# Patient Record
Sex: Female | Born: 1955
Health system: Southern US, Community
[De-identification: ages and names within clinical notes are randomized; demographics above are authoritative.]

## PROBLEM LIST (undated history)

## (undated) DIAGNOSIS — E7800 Pure hypercholesterolemia, unspecified: Secondary | ICD-10-CM

## (undated) DIAGNOSIS — J449 Chronic obstructive pulmonary disease, unspecified: Secondary | ICD-10-CM

## (undated) DIAGNOSIS — R609 Edema, unspecified: Secondary | ICD-10-CM

## (undated) DIAGNOSIS — E782 Mixed hyperlipidemia: Secondary | ICD-10-CM

## (undated) DIAGNOSIS — I6523 Occlusion and stenosis of bilateral carotid arteries: Secondary | ICD-10-CM

## (undated) DIAGNOSIS — J45909 Unspecified asthma, uncomplicated: Secondary | ICD-10-CM

## (undated) DIAGNOSIS — Z860100 Personal history of colon polyps, unspecified: Secondary | ICD-10-CM

## (undated) DIAGNOSIS — G8929 Other chronic pain: Secondary | ICD-10-CM

## (undated) DIAGNOSIS — F1020 Alcohol dependence, uncomplicated: Secondary | ICD-10-CM

## (undated) DIAGNOSIS — R4689 Other symptoms and signs involving appearance and behavior: Secondary | ICD-10-CM

## (undated) DIAGNOSIS — D126 Benign neoplasm of colon, unspecified: Secondary | ICD-10-CM

## (undated) DIAGNOSIS — K259 Gastric ulcer, unspecified as acute or chronic, without hemorrhage or perforation: Secondary | ICD-10-CM

## (undated) DIAGNOSIS — K625 Hemorrhage of anus and rectum: Secondary | ICD-10-CM

## (undated) DIAGNOSIS — F411 Generalized anxiety disorder: Secondary | ICD-10-CM

## (undated) DIAGNOSIS — M797 Fibromyalgia: Secondary | ICD-10-CM

## (undated) DIAGNOSIS — F329 Major depressive disorder, single episode, unspecified: Secondary | ICD-10-CM

## (undated) DIAGNOSIS — K2950 Unspecified chronic gastritis without bleeding: Secondary | ICD-10-CM

## (undated) DIAGNOSIS — L409 Psoriasis, unspecified: Secondary | ICD-10-CM

## (undated) DIAGNOSIS — K589 Irritable bowel syndrome without diarrhea: Secondary | ICD-10-CM

## (undated) DIAGNOSIS — F332 Major depressive disorder, recurrent severe without psychotic features: Secondary | ICD-10-CM

## (undated) DIAGNOSIS — K219 Gastro-esophageal reflux disease without esophagitis: Secondary | ICD-10-CM

## (undated) DIAGNOSIS — F17200 Nicotine dependence, unspecified, uncomplicated: Secondary | ICD-10-CM

## (undated) DIAGNOSIS — C50211 Malignant neoplasm of upper-inner quadrant of right female breast: Secondary | ICD-10-CM

## (undated) DIAGNOSIS — M5430 Sciatica, unspecified side: Secondary | ICD-10-CM

## (undated) DIAGNOSIS — F32A Depression, unspecified: Secondary | ICD-10-CM

## (undated) DIAGNOSIS — F603 Borderline personality disorder: Secondary | ICD-10-CM

## (undated) DIAGNOSIS — E785 Hyperlipidemia, unspecified: Secondary | ICD-10-CM

## (undated) DIAGNOSIS — C50919 Malignant neoplasm of unspecified site of unspecified female breast: Secondary | ICD-10-CM

## (undated) DIAGNOSIS — I10 Essential (primary) hypertension: Secondary | ICD-10-CM

## (undated) DIAGNOSIS — F419 Anxiety disorder, unspecified: Secondary | ICD-10-CM

## (undated) DIAGNOSIS — J309 Allergic rhinitis, unspecified: Secondary | ICD-10-CM

## (undated) HISTORY — PX: DIAGNOSTIC LAPAROSCOPY: SUR761

## (undated) HISTORY — DX: Mixed hyperlipidemia: E78.2

## (undated) HISTORY — DX: Chronic obstructive pulmonary disease, unspecified: J44.9

## (undated) HISTORY — DX: Occlusion and stenosis of bilateral carotid arteries: I65.23

## (undated) HISTORY — DX: Unspecified chronic gastritis without bleeding: K29.50

## (undated) HISTORY — DX: Gastro-esophageal reflux disease without esophagitis: K21.9

## (undated) HISTORY — DX: Anxiety disorder, unspecified: F41.9

## (undated) HISTORY — DX: Hemorrhage of anus and rectum: K62.5

## (undated) HISTORY — DX: Fibromyalgia: M79.7

## (undated) HISTORY — DX: Personal history of colonic polyps: Z86.010

## (undated) HISTORY — DX: Benign neoplasm of colon, unspecified: D12.6

## (undated) HISTORY — DX: Psoriasis, unspecified: L40.9

## (undated) HISTORY — DX: Unspecified asthma, uncomplicated: J45.909

## (undated) HISTORY — DX: Malignant neoplasm of unspecified site of unspecified female breast: C50.919

## (undated) HISTORY — PX: DILATION AND CURETTAGE OF UTERUS: SHX78

## (undated) HISTORY — DX: Irritable bowel syndrome, unspecified: K58.9

## (undated) HISTORY — DX: Generalized anxiety disorder: F41.1

## (undated) HISTORY — DX: Malignant neoplasm of upper-inner quadrant of right female breast: C50.211

## (undated) HISTORY — DX: Other symptoms and signs involving appearance and behavior: R46.89

## (undated) HISTORY — DX: Other chronic pain: G89.29

## (undated) HISTORY — DX: Major depressive disorder, recurrent severe without psychotic features: F33.2

## (undated) HISTORY — DX: Pure hypercholesterolemia, unspecified: E78.00

## (undated) HISTORY — DX: Sciatica, unspecified side: M54.30

## (undated) HISTORY — DX: Alcohol dependence, uncomplicated: F10.20

## (undated) HISTORY — DX: Nicotine dependence, unspecified, uncomplicated: F17.200

## (undated) HISTORY — DX: Allergic rhinitis, unspecified: J30.9

## (undated) HISTORY — DX: Hyperlipidemia, unspecified: E78.5

## (undated) HISTORY — PX: GYNECOLOGIC CRYOSURGERY: SHX857

## (undated) HISTORY — DX: Personal history of colon polyps, unspecified: Z86.0100

## (undated) HISTORY — DX: Borderline personality disorder: F60.3

---

## 2011-10-24 DIAGNOSIS — G8929 Other chronic pain: Secondary | ICD-10-CM | POA: Diagnosis present

## 2011-10-24 HISTORY — DX: Other chronic pain: G89.29

## 2011-11-15 DIAGNOSIS — M797 Fibromyalgia: Secondary | ICD-10-CM | POA: Diagnosis present

## 2011-11-15 DIAGNOSIS — J449 Chronic obstructive pulmonary disease, unspecified: Secondary | ICD-10-CM

## 2011-11-15 HISTORY — DX: Chronic obstructive pulmonary disease, unspecified: J44.9

## 2012-02-11 DIAGNOSIS — L409 Psoriasis, unspecified: Secondary | ICD-10-CM | POA: Insufficient documentation

## 2012-02-11 HISTORY — DX: Psoriasis, unspecified: L40.9

## 2014-02-01 ENCOUNTER — Emergency Department: Payer: Self-pay | Admitting: Student

## 2014-06-30 ENCOUNTER — Inpatient Hospital Stay: Payer: Self-pay | Admitting: Internal Medicine

## 2014-06-30 ENCOUNTER — Other Ambulatory Visit: Payer: Self-pay | Admitting: Emergency Medicine

## 2014-07-04 ENCOUNTER — Inpatient Hospital Stay: Payer: Self-pay | Admitting: Psychiatry

## 2014-09-18 NOTE — Consult Note (Signed)
PATIENT NAME:  Elizabeth Cherry, Elizabeth Cherry MR#:  [id number] DATE OF BIRTH:  [birth year]  DATE OF CONSULTATION:  07/01/2014  REFERRING PHYSICIAN:   CONSULTING PHYSICIAN:  Gonzella Lex, MD  I am certain I did this yesterday, and I do not know what happened to it.   IDENTIFYING INFORMATION AND REASON FOR CONSULTATION: This is a 59 year old woman who came into the hospital unresponsive.   CHIEF COMPLAINT: "I took an overdose."   HISTORY OF PRESENT ILLNESS: Information obtained from the patient and the chart. The patient reports that she took an overdose of approximately 600 mg of Restoril. She states that this was an impulsive ACT at the time, but she had been having suicidal thoughts for over a week. She has been feeling extremely depressed for months. Poor sleep, poor appetite. Feels hopeless. Denies having any hallucinations. Says that she has frequent suicidal thoughts and feels like her life is hopeless. These symptoms have been present for years, but have been worse recently. She is not currently seeing anyone for any outpatient psychiatric treatment. She denies any recent abuse of alcohol or drugs. The patient evidently had been to outside observers in her usual state of health until she was told that she was not able to live in the house where she was staying. She went and locked herself in her room and the next morning, was found to be unconscious.   PAST PSYCHIATRIC HISTORY: The patient has a history of depression with several prior hospitalizations. Has been on multiple medications in the past. Zoloft had been effective years ago, but then no longer seemed to be helping. More recently, other antidepressants and other medications had not seemed to help. Has a history of at least 1 serious suicide attempt in the past, as well.   FAMILY HISTORY: Several people in the family with depression and some family history of suicide.   PAST MEDICAL HISTORY: The patient states she has chronic pain from fibromyalgia.  Used to be on fentanyl patches, which are no longer being prescribed for her.   SOCIAL HISTORY: The patient is not married. She used to work in IT until a few years ago. Has not worked in quite a while. Was recently evicted from the home she was renting because she intentionally stopped paying the rent, despite knowing the outcome. Now, has no place to live.   SUBSTANCE ABUSE HISTORY: Past history of alcohol abuse. She says she stopped drinking years ago and no longer drinks. Has used marijuana occasionally.   The patient has some possible history of abuse of Xanax and opiates, but she denies or minimizes it.   CURRENT MEDICATIONS: None by her report.   ALLERGIES: BACTRIM.   REVIEW OF SYSTEMS: Depressed mood. Suicidal ideation. No hallucinations. Feels tired and run down. Feels like she has full body aches and pains.   MENTAL STATUS EXAMINATION: Slightly disheveled woman, looks her stated age. Cooperative with the interview. Eye contact good. Psychomotor activity restrained. Speech is flat in tone, normal in rate. Affect is dysphoric, down, and irritable. Mood is stated as depressed. Thoughts: Lucid, no loosening of associations. Denies auditory or visual hallucinations. Denies suicidal intent currently, but says she still has suicidal ideation and wishes she were dead. No homicidal ideation. She is alert and oriented x 4. Repeats 3 objects immediately, remembers 2 of them at 3 minutes. Judgment and insight: Poor. Intelligence: Normal.   LABORATORY RESULTS: Blood sugars low on admission, but had come up to normal. TSH normal. Low protein and albumin,  possibly poor nutrition. Alcohol level negative. Benzodiazepines positive on drug screen.   VITAL SIGNS: Blood pressure 102/72, respirations 18, pulse 78, temperature 98.1.   ASSESSMENT: This is a 59 year old woman with a history of severe major depression, suicidal ideation, and a recent clearcut suicide attempt with ongoing suicidal thoughts. The  patient is irritable and oppositional to treatment, while symptoms the same time, maintaining her suicidality. The patient requires hospitalization for further treatment and safety.   TREATMENT PLAN: I initiated involuntary commitment papers on her to keep her in the hospital. I will admit her to psychiatry as soon as a bed is available. Currently, we do not have beds available. I will communicate with the internal medicine team that we will need to keep for observation until we can transfer her to psychiatry.   DIAGNOSIS, PRINCIPAL AND PRIMARY:  AXIS I: Major depression, severe, recurrent.   SECONDARY DIAGNOSES: AXIS I: Rule out benzodiazepine abuse. History of alcohol abuse, in remission.  AXIS II: Some histrionic features.  AXIS III: Chronic pain. Recent overdose.    ____________________________ Gonzella Lex, MD jtc:mw D: 07/02/2014 16:58:40 ET T: 07/02/2014 17:11:38 ET JOB#: [id number]  cc: Gonzella Lex, MD, <Dictator> Gonzella Lex MD ELECTRONICALLY SIGNED 07/04/2014 10:44

## 2014-09-18 NOTE — H&P (Signed)
PATIENT NAME:  Elizabeth Cherry, Elizabeth Cherry MR#:  [id number] DATE OF BIRTH:  Jan 26, 1956  DATE OF ADMISSION:  07/04/2014  DATE OF BIRTH: [birth year]    DATE OF ASSESSMENT: February 16  REFERRING PHYSICIAN:  [facility].   ATTENDING PHYSICIAN:  Clovis Fredrickson, MD   IDENTIFYING DATA: Elizabeth Cherry is a 59 year old female with history of depression and anxiety.   CHIEF COMPLAINT: "I overdosed."   HISTORY OF PRESENT ILLNESS: Elizabeth Cherry has been under considerable stress lately. She stopped taking Celexa several months ago and has become increasingly depressed with poor sleep, decreased appetite, anhedonia, feeling of guilt, hopelessness, worthlessness, poor energy and concentration, social isolation, crying spells, and heightened anxiety. She lost her housing. She has been renting a house for 14 years, but it no longer is available to her. She briefly moved in with a friend, but this is not a permanent solution and while she is waiting to finalize apartment rental in [place], she was asked by her friend to move out. That night she went to her room overdosed on a bottle of Restoril. She was found the following morning unresponsive and hypothermic. She was brought to the hospital and briefly admitted to CCU. She was transferred to [place] unit for further stabilization. The patient initially was somewhat confused from overdose on benzodiazepines and not able to participate in psychiatric interview. She would give [place] non reassuring answers, uncertain if she was glad to be around, uncertain whether she overdosed on purpose or not and completely clueless about what to do next when she is discharged from the hospital. I did see this patient on the medical floor on Sunday; unfortunately, we did not have beds at that time and only on Monday, the patient was transferred to psychiatry. She is somewhat better, her thoughts are better put together. She now is able to answer some  questions; although, she is still rather vague. She does admit that it was a suicide attempt; that she has been overwhelmed lately and she did it on an impulse. She is glad to be alive. She is uncertain whether or not she feels that she can face multiple problems. For example, she cannot really stay with a friend any longer and on Monday, will be moving to new apartment in [place], if it is finalized. She still needs talk to talk to the landlord. She denies psychotic symptoms, denies symptoms suggestive of bipolar mania. She denies alcohol, illicit drugs or prescription pill abuse.   PAST PSYCHIATRIC HISTORY: A long history of depression and anxiety. She has been tried on Prozac, Zoloft, Lexapro, Celexa, Wellbutrin, Cymbalta, and Remeron in the past. She believes that Prozac was the best medication she has ever had.   SHE BECAME ALLERGIC TO THE COMPONENTS OF GENETIC PREPARATION OF PROZAC WITH SWELLING OF HER THROAT AND TONGUE.   Somehow she was unable to obtained brand name of Prozac ever since. Zoloft was the next one working somewhat better, she took it for 7 years, but then it stopped working and she is not really interested in going back. Prior to the hospital, she was prescribed Celexa, took it for a while but then stopped. She did not feel that it was particularly useful. She is not interested in augmentation. She has never taken Abilify, thyroid hormones, or BuSpar, but she wants to limit the number of medications she is using and is not interested in adding yet another medicine to her regimen.   She reports at least 1 suicide attempt  2 years ago when she had to put her cat to sleep; she overdosed on blood pressure medication and was admitted to [facility]. I believe that there were other psychiatric admissions, although the patient is not forthcoming with the information as she makes a comment that our unit is the most difficult for her to be in as we have mixed population of patient and some of her peers  appear frightening to her. She feels unsafe here. There is a history of alcohol abuse, but she has been sober for many years.   FAMILY PSYCHIATRIC HISTORY: None reported.   PAST MEDICAL HISTORY: Hypertension, fibromyalgia, GERD.   ALLERGIES:  BACTRIM.   MEDICATIONS: At the time of transfer, sucralfate 1 mg 3 times daily, before meals; pantoprazole 40 mg twice daily, Bentyl 10 mg every 8 hours.   SOCIAL HISTORY: She is disabled from fibromyalgia. 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 She is not married. She has very limited support apparently but does really not want to talk about it. She used to rent a house in [place], for many years. She is now forced to relocate. Apparently, she found an apartment in [place]. She does not have a psychiatrist. She tells me that last time she went to [facility] and she was forced to take drug test which was negative and she did not feel she needed it. Her insurance company did not cover it and she had to pay 175 dollars, in addition to 60 dollars of her copay. She understands that this has been their policy and she will not see them again as she simply cannot afford it and does not feel that drug testing is necessary in her case.   REVIEW OF SYSTEMS:  CONSTITUTIONAL: No fevers or chills. No weight changes.  EYES: No double or blurred vision.  ENT: No hearing loss.  RESPIRATORY:  No shortness of breath or cough.  CARDIOVASCULAR: No chest pain or orthopnea.  GASTROINTESTINAL: Positive for occasional abdominal pain and diarrhea.  GENITOURINARY: No incontinence or frequency.  ENDOCRINE: No heat or cold intolerance.  LYMPHATIC: No anemia or easy bruising.  INTEGUMENTARY: No acne or rash.  MUSCULOSKELETAL: No muscle or joint pain.  NEUROLOGIC: No tingling or weakness.  PSYCHIATRIC: See history of present illness for details.   PHYSICAL EXAMINATION: VITAL SIGNS: Blood pressure 116/79, pulse 91, respirations 18,  temperature 98.  GENERAL: This is a slender middle-aged female in no acute distress.  HEENT: The pupils are equal, round, and reactive to light. Sclerae are anicteric.  NECK: Supple. No thyromegaly.  LUNGS: Clear to auscultation. No dullness to percussion.  HEART: Regular rhythm and rate. No members, rubs, or gallops.  ABDOMEN: Soft, nontender, nondistended. Positive bowel sounds.  MUSCULOSKELETAL: Normal muscle strength in all extremities.  SKIN: No rashes or bruises.  LYMPHATIC: No cervical adenopathy.  NEUROLOGIC: Cranial nerves II through XII are intact.   LABORATORY DATA: Chemistries are within normal limits; blood alcohol level on admission zero; LFTs within normal limits, TSH 1.35, urine tox screen positive for benzodiazepines; CBC within normal limits. Urinalysis is not suggestive of urinary tract infection. Serum acetaminophen and salicylates were low.   MENTAL STATUS EXAMINATION: On admission, the patient is alert and oriented to person, place, time and situation. She is pleasant, polite and cooperative but difficult to engage, rather guarded. She is well groomed and casually dressed. She maintains good eye contact. Her speech is of normal rhythm, rate and volume. There is paucity of  speech. Mood is depressed and anxious. She denies thoughts of hurting herself or others. There are no delusions or paranoia. There are no auditory or visual hallucinations. Her cognition is grossly intact. Registration, recall, short and long-term memory are intact. She is of average intelligence and fund of knowledge. Her insight and judgment are fair.   SUICIDE RISK ASSESSMENT: On admission, this is a patient with a long history of depression, anxiety, and suicide attempts, who was admitted after a suicide attempt in the context of multiple social stressors.   AXIS I: Major depressive disorder, recurrent, severe.   AXIS II: Deferred.   AXIS III: Fibromyalgia, hypertension.   PLAN: The patient was  admitted to [place] unit for safety, stabilization and medication management.  1.  Suicidal ideation: The patient is able to contract for safety.  2.  Mood. I started her last night on Celexa as this was last antidepressant she has taken. She does not want to go back to Zoloft. She does not want to go back to [place].  She is not interested in augmentation.  3.  Medical. We will continue Bentyl and Carafate and pantoprazole for stomach problems.  4.  Chronic pain.  The patient used to go to pain clinic and was prescribed fentanyl several years ago but no longer is a patient there.  5.  Hypertension. She has a history of high blood pressure, ordinarily on metoprolol 50 mg. This was discontinued on the medical floor due to low blood pressure. We will restart metoprolol.   DISPOSITION: The patient will meet with her friend today to discuss her options. She will work with the Education officer, museum on finding proper follow-up arrangements within her network.    ____________________________ Wardell Honour. Bary Leriche, MD jbp:nt D: 07/05/2014 13:40:49 ET T: 07/05/2014 14:04:30 ET JOB#: [id number]  cc: Charlis Harner B. Bary Leriche, MD, <Dictator> Clovis Fredrickson MD ELECTRONICALLY SIGNED 08/01/2014 7:18

## 2014-09-18 NOTE — H&P (Signed)
PATIENT NAME:  Elizabeth Cherry, BERNALES MR#:  [id number] DATE OF BIRTH:  22-Apr-1956  DATE OF ADMISSION:  06/30/2014  DATE OF DISCHARGE: 06/30/2014.   PRIMARY CARE PROVIDER: None.   EMERGENCY DEPARTMENT REFERRING PHYSICIAN: Dr. Archie Balboa.   CHIEF COMPLAINT: Decrease in responsiveness.   HISTORY OF PRESENT ILLNESS: The patient is a 59 year old white female with history of alcohol abuse in the past who was kicked out from the place where she lives, so she went to stay with a friend since last Wednesday. He reports that she was doing well up until last night, when he told her that she would have to leave his house. The patient locked herself in the room. Then, when he tried to wake her up this morning she was very lethargic. The patient was brought to the ED initially hypothermic, temperature 95.6, was very hard to arouse. Her blood pressure was low in the 80s, but her WBC count was normal. A CT scan of the head was negative. The only thing that was positive in her toxic urine drug screen was benzodiazepines and she does have a history of using possible Xanax. Other than that, her friend is unable to provide any history. There is no family available.   PAST MEDICAL HISTORY: History of alcohol abuse. According to him, he met her in Alcoholics Anonymous and to his knowledge, she has not drunk in 18 years. No other medical history is known. He does report that she has some sort of skin dry disorder going on for the past three months.   PAST SURGICAL HISTORY: Unknown.   ALLERGIES: BACTRIM.   MEDICATIONS: Unknown.  SOCIAL HISTORY: She states that she smokes one-half pack per day. Does not drink.  He is not aware of any drug use. No family history is available.   REVIEW OF SYSTEMS: Unobtainable.   PHYSICAL EXAMINATION: VITAL SIGNS: Temperature 95.6, pulse 83, respirations 18, blood pressure 83/64, O2 96%.  GENERAL: The patient is a critically ill-appearing female. Poorly responsive.  HEENT: Head atraumatic,  normocephalic. Pupils equally round, reactive to light and accommodation. There is no conjunctival pallor no scleral icterus. Nasal exam shows no drainage or ulceration. Oropharynx from exterior appears dry. Ear exam externally: No erythema or ulcers. Nasal exam shows no drainage or ulceration.  CARDIOVASCULAR: Regular rate and rhythm. No murmurs, rubs, clicks, or gallops.  LUNGS: Clear to auscultation bilaterally without any rales, rhonchi, wheezing.  ABDOMEN: Soft, nontender, nondistended. Positive bowel sounds x4.  EXTREMITIES: No clubbing, cyanosis, or edema.  SKIN: She has basically dry scaly skin on her face, throughout her face, which apparently is a chronic issue going on for the past few months.  NEUROLOGIC: The patient is poorly responsive. Unable to do an exam.  PSYCHIATRIC: The patient is poorly responsive.  LYMPH NODES: Nonpalpable.  MUSCULOSKELETAL: There is no erythema or swelling.   EVALUATIONS: ABG: pH of 7.38, pCO2 of 49, pO2 of 54, bicarbonate 29. CT scan of the head without contrast showed no intracranial mass or abnormality. WBC 7.3, hemoglobin 13.2, platelet count was 311,000. Glucose 74, BUN 10, creatinine 0.65, sodium 146, potassium 3.8, chloride 111. LFTs were normal, except of 2.8. Alcohol level less than 3. CT scan of the head without any abnormality. Chest x-ray shows no acute cardiopulmonary processes.   ASSESSMENT AND PLAN: The patient is a 59 year old white female with a history of alcohol abuse a long time ago; has been living with a friend. Is being admitted with hypotension, hypothermia, poor responsiveness.  1. Acute encephalopathy, possibly  due to benzodiazepine ingestion with overdose, possible sepsis. At this time, we will continue warming measures. Empiric antibiotics with Zosyn and vancomycin. Panculture and will check a TSH and cortisol. Psychiatric input once awake.  2. Hypotension. Possibly due to sepsis, unclear source. Will give her IV fluids. The patient  may need Levophed.  3. Hypothermia. Warming measures. Supportive care. TSH check.   CODE STATUS: Full.   MISCELLANEOUS: The patient will be on Lovenox for deep vein thrombosis prophylaxis.   Please note, this is a critical care patient.   TIME SPENT: 55 minutes high risk of cardiopulmonary arrest. Updated friend who was at the bedside.     ____________________________ Lafonda Mosses Posey Pronto, MD shp:lm D: 06/30/2014 15:26:33 ET T: 06/30/2014 15:33:53 ET JOB#: [id number]  cc: Terance Pomplun H. Posey Pronto, MD, <Dictator> Alric Seton MD ELECTRONICALLY SIGNED 07/08/2014 15:55

## 2014-09-18 NOTE — Consult Note (Signed)
Brief Consult Note: Diagnosis: Major depressive disorder recurrent severe.   Patient was seen by consultant.   Recommend further assessment or treatment.   Comments: Please transfer patint to psychiatry.  Electronic Signatures: Orson Slick (MD)  (Signed 14-Feb-16 12:09)  Authored: Brief Consult Note   Last Updated: 14-Feb-16 12:09 by Orson Slick (MD)

## 2014-09-18 NOTE — Consult Note (Signed)
PATIENT NAME:  NASIRAH, SACHS MR#:  [id number] DATE OF BIRTH:  26-Aug-1955  DATE OF CONSULTATION:  07/01/2014  REFERRING PHYSICIAN:   CONSULTING PHYSICIAN:  Gonzella Lex, MD  IDENTIFYING INFORMATION AND REASON FOR CONSULT: A 59 year old woman with a reported past history of depression as well as alcohol abuse, who was brought into the hospital on the 11th unresponsive after an overdose. Consult for follow up of possible overdose.   HISTORY OF PRESENT ILLNESS: Information obtained from the patient and the chart. According to the intake note yesterday, a friend who brought her in said that she had been staying with him until he told her that she would have to leave. At that point, she locked herself in a room and the next morning he found her unresponsive. The patient tells me that she took about 600 mg of Restoril. She says that she did this with the intention of dying. She describes herself as having been severely depressed for a long time, but that it has been getting worse recently. She claims that she has not slept at all in an entire year. Appetite is poor.  Has no interest in any activities. She describes her situation just being "I feel terrible. Life is difficult." She is not getting any psychiatric treatment currently. She denies that she has been drinking. Denies that she has been abusing any drugs recently. Denies any hallucinations or psychotic symptoms.   PAST PSYCHIATRIC HISTORY: Describes a long history of depression with multiple hospitalizations at [facility], [place] and [facility]. She has a past history of suicide attempts including one this past May. She says she has been on several antidepressants in the past. Zoloft was effective for several years, but then "stopped working" in 2005. It does not sound like she has been on effective treatment since then. She says that she saw a doctor in the fall of this year who prescribed her a mood stabilizer, which made her "crazy."    SOCIAL HISTORY: The patient is divorced, no children. Lives by herself. 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 Currently, has no place to live. Was living in a rental house and then stopped paying the rent even though she had the money until she got thrown out. Now appears to be homeless. Says that her only family contact is her 30 year old mother who she describes as "very domineering."   PAST MEDICAL HISTORY: The patient has a history of high blood pressure and fibromyalgia. Not currently on any prescription medicine because she stopped taking everything she was prescribed. Unclear what her blood pressure medicine was. Says that she used to be on a fentanyl patch and other narcotics for her fibromyalgia. I have checked and it appears there is no record of her getting narcotics at least in the last 6 months.   FAMILY HISTORY: Says she has a positive family history of not only depression, but completed suicide.   SUBSTANCE ABUSE HISTORY: Currently does not drink regularly. She says that she had a past history of severe alcohol abuse, but stopped drinking years ago. She denies abusing other drugs, including prescription drugs.   CURRENT MEDICATIONS: None.   ALLERGIES: BACTRIM.   REVIEW OF SYSTEMS: Feeling terrible. Feeling achy and painful all over her body. Mood feels terrible and depressed. Positive suicidal ideation. No hallucinations.   MENTAL STATUS EXAMINATION: A somewhat disheveled, chronically ill-looking woman, who looks older than her stated age. Cooperative with the interview, but somewhat  evasive. Eye contact good. Psychomotor activity slow, but not dramatically so. Speech is somewhat slow, but easy to understand, and well articulated. Affect is flat and dysphoric. Mood stated as being terrible. Thoughts are lucid without any sign of psychosis. Denies auditory or visual hallucinations. Denies paranoia. Endorses positive suicidal  ideation. No homicidal ideation. Remembers 3/3 objects immediately, 2/3 at 3 minutes. Alert and oriented x4. Judgment and insight poor. Intelligence normal.   LABORATORY RESULTS: Admission labs included sodium elevated 146, chloride elevated 111, low calcium, low total protein at 5.5 and low albumin at 2.8. Alcohol level negative. TSH normal. Drug screen positive only for benzodiazepines. Potassium is low today at 3.1. Hematology panel normal on presentation. Blood glucoses are now normal. Urinalysis normal, no growth on blood cultures. The acetaminophen is low. Salicylates negative.   VITAL SIGNS: Blood pressure 113/83, respirations 16, pulse 96, temperature 98.9.   ASSESSMENT: A 59 year old woman who appears to be taking very poor care of herself and to be malnourished probably and dehydrated, made a very serious suicide attempt. Clearly intended to die. Has a history of prior suicide attempts as well and multiple symptoms of major depression with severe stresses. The patient meets commitment criteria and is not safe to be discharged currently.   TREATMENT PLAN: I have filed involuntary commitment paperwork in the hospital. The patient needs to continue to have a sitter in the hospital. Once she is stable, she should be transferred to the behavioral health unit. Currently, we do not have a bed and it seems unlikely that there will be any over the weekend. If she is transferred to the floor, she should continue with a sitter. I am going to defer starting any psychiatric medicines because of her recently unstable vital signs and labs until she is more physically stable. Psychoeducation done with the patient.   DIAGNOSIS, PRINCIPAL AND PRIMARY:  AXIS I: Major depression, severe, recurrent.   SECONDARY DIAGNOSES:   AXIS I:  Alcohol abuse, severe in remission.  AXIS II: Histrionic features.  AXIS III: History of fibromyalgia and high blood pressure.      ____________________________ Gonzella Lex, MD jtc:by D: 07/01/2014 21:15:00 ET T: 07/01/2014 21:36:28 ET JOB#: [id number]  cc: Gonzella Lex, MD, <Dictator>  Gonzella Lex MD ELECTRONICALLY SIGNED 07/04/2014 10:44

## 2014-09-18 NOTE — Discharge Summary (Signed)
PATIENT NAME:  Elizabeth Cherry, Elizabeth Cherry MR#:  [id number] DATE OF BIRTH:  14-Jul-1955  DATE OF ADMISSION:  06/30/2014 DATE OF DISCHARGE:  07/04/2014  ADMITTING DIAGNOSIS: Decrease in responsiveness, hypotension, hypothermia.   DISCHARGE DIAGNOSES:  1.  Acute encephalopathy as overdose on 30 pills of Restoril.   2.  Hypothermia, hypotension due to overdose on Restoril, no evidence of sepsis.  3.  Severe depression, anxiety.  4.  Nausea and multiple gastrointestinal symptoms, possibly related to anxiety.  5.  Hypokalemia, status post replacement.   CONSULTANTS: Gonzella Lex, MD   PERTINENT LABORATORIES AND EVALUATIONS: Admitting glucose 74, BUN 10, creatinine 0.65, sodium 146, potassium was 3.8, chloride 111, CO2 was 31, calcium 8.2, glucose 69. LFTs were normal, except a total protein of 5.8, albumin 2.8. Troponin less than 0.02. TSH 1.35. Toxic urine drug screen was positive for benzodiazepines. Blood cultures x 2 no growth. Urine cultures no growth. Cortisol level was 11.5.   HOSPITAL COURSE: Please refer to H and P done by the admitting physician. The patient is a 59 year old white female who was brought to our ED after she was found poorly responsive by her friend whom she was staying with. The patient was noted to be hypothermic and hypotensive in the ER initially. The patient was given aggressive IV fluids and warming measures and supportive care was provided. By day 2, the patient started responding. Her blood pressure improved. Her hypothermia resolved. On further questioning, she reported that she was severely depressed and took 30 pills of Restoril. The patient started clinically improving; however, she was very depressed, therefore seen by psychiatry and accepted to behavioral medicine. The patient also complained of numerous GI symptoms and was tried on Maalox and other medications. Her symptoms were felt to be due to possible anxiety.   DISCHARGE MEDICATIONS: Tylenol 650 q. 4 p.r.n. for pain,  dicyclomine 10 mg 1 tablet p.o. q. 8, Protonix 40 mg 1 tablet p.o. b.i.d., Maalox 30 q. 4 p.r.n.   DIET: Regular.   ACTIVITY: As tolerated.   FOLLOWUP: Per psychiatry.   TIME SPENT On the discharge: 35 minutes.    ____________________________ Lafonda Mosses Posey Pronto, MD shp:bm D: 07/05/2014 20:35:44 ET T: 07/06/2014 02:59:04 ET JOB#: [id number]  cc: Ayris Carano H. Posey Pronto, MD, <Dictator> Alric Seton MD ELECTRONICALLY SIGNED 07/08/2014 15:55

## 2015-10-23 ENCOUNTER — Emergency Department
Admission: EM | Admit: 2015-10-23 | Discharge: 2015-10-24 | Disposition: A | Discharge status: Other Acute Inpatient Hospital | Payer: No Typology Code available for payment source | Attending: Emergency Medicine | Admitting: Emergency Medicine

## 2015-10-23 ENCOUNTER — Encounter: Payer: Self-pay | Admitting: Emergency Medicine

## 2015-10-23 DIAGNOSIS — N390 Urinary tract infection, site not specified: Secondary | ICD-10-CM | POA: Insufficient documentation

## 2015-10-23 DIAGNOSIS — I10 Essential (primary) hypertension: Secondary | ICD-10-CM | POA: Insufficient documentation

## 2015-10-23 DIAGNOSIS — F332 Major depressive disorder, recurrent severe without psychotic features: Secondary | ICD-10-CM | POA: Diagnosis not present

## 2015-10-23 DIAGNOSIS — F17210 Nicotine dependence, cigarettes, uncomplicated: Secondary | ICD-10-CM | POA: Insufficient documentation

## 2015-10-23 DIAGNOSIS — R4689 Other symptoms and signs involving appearance and behavior: Secondary | ICD-10-CM

## 2015-10-23 DIAGNOSIS — F329 Major depressive disorder, single episode, unspecified: Secondary | ICD-10-CM | POA: Insufficient documentation

## 2015-10-23 DIAGNOSIS — R45851 Suicidal ideations: Secondary | ICD-10-CM | POA: Diagnosis present

## 2015-10-23 DIAGNOSIS — F32A Depression, unspecified: Secondary | ICD-10-CM

## 2015-10-23 HISTORY — DX: Essential (primary) hypertension: I10

## 2015-10-23 HISTORY — DX: Other symptoms and signs involving appearance and behavior: R46.89

## 2015-10-23 HISTORY — DX: Gastric ulcer, unspecified as acute or chronic, without hemorrhage or perforation: K25.9

## 2015-10-23 HISTORY — DX: Depression, unspecified: F32.A

## 2015-10-23 HISTORY — DX: Major depressive disorder, recurrent severe without psychotic features: F33.2

## 2015-10-23 HISTORY — DX: Major depressive disorder, single episode, unspecified: F32.9

## 2015-10-23 LAB — COMPREHENSIVE METABOLIC PANEL
ALBUMIN: 4.5 g/dL (ref 3.5–5.0)
ALK PHOS: 63 U/L (ref 38–126)
ALT: 9 U/L — AB (ref 14–54)
ANION GAP: 9 (ref 5–15)
AST: 16 U/L (ref 15–41)
BILIRUBIN TOTAL: 1.2 mg/dL (ref 0.3–1.2)
BUN: 10 mg/dL (ref 6–20)
CALCIUM: 9.4 mg/dL (ref 8.9–10.3)
CO2: 22 mmol/L (ref 22–32)
CREATININE: 0.8 mg/dL (ref 0.44–1.00)
Chloride: 104 mmol/L (ref 101–111)
GFR calc Af Amer: >60 mL/min (ref >60)
GFR calc non Af Amer: >60 mL/min (ref >60)
GLUCOSE: 132 mg/dL — AB (ref 65–99)
Potassium: 3.1 mmol/L — ABNORMAL LOW (ref 3.5–5.1)
SODIUM: 135 mmol/L (ref 135–145)
TOTAL PROTEIN: 7 g/dL (ref 6.5–8.1)

## 2015-10-23 LAB — URINE DRUG SCREEN, QUALITATIVE (ARMC ONLY)
Amphetamines, Ur Screen: NOT DETECTED
BARBITURATES, UR SCREEN: NOT DETECTED
Benzodiazepine, Ur Scrn: NOT DETECTED
COCAINE METABOLITE, UR ~~LOC~~: NOT DETECTED
Cannabinoid 50 Ng, Ur ~~LOC~~: NOT DETECTED
MDMA (Ecstasy)Ur Screen: NOT DETECTED
METHADONE SCREEN, URINE: NOT DETECTED
OPIATE, UR SCREEN: NOT DETECTED
PHENCYCLIDINE (PCP) UR S: NOT DETECTED
Tricyclic, Ur Screen: NOT DETECTED

## 2015-10-23 LAB — SALICYLATE LEVEL: Salicylate Lvl: <4 mg/dL (ref 2.8–30.0)

## 2015-10-23 LAB — URINALYSIS COMPLETE WITH MICROSCOPIC (ARMC ONLY)
BACTERIA UA: NONE SEEN
Bilirubin Urine: NEGATIVE
GLUCOSE, UA: NEGATIVE mg/dL
Ketones, ur: NEGATIVE mg/dL
Nitrite: NEGATIVE
PH: 6 (ref 5.0–8.0)
Protein, ur: NEGATIVE mg/dL
Specific Gravity, Urine: 1.005 (ref 1.005–1.030)

## 2015-10-23 LAB — CBC
HEMATOCRIT: 47.3 % — AB (ref 35.0–47.0)
HEMOGLOBIN: 16.3 g/dL — AB (ref 12.0–16.0)
MCH: 29.8 pg (ref 26.0–34.0)
MCHC: 34.5 g/dL (ref 32.0–36.0)
MCV: 86.6 fL (ref 80.0–100.0)
Platelets: 344 10*3/uL (ref 150–440)
RBC: 5.46 MIL/uL — ABNORMAL HIGH (ref 3.80–5.20)
RDW: 13.7 % (ref 11.5–14.5)
WBC: 9.9 10*3/uL (ref 3.6–11.0)

## 2015-10-23 LAB — ACETAMINOPHEN LEVEL

## 2015-10-23 LAB — ETHANOL: Alcohol, Ethyl (B): <5 mg/dL (ref <5)

## 2015-10-23 MED ORDER — CEPHALEXIN 500 MG PO CAPS
500.0000 mg | ORAL_CAPSULE | Freq: Three times a day (TID) | ORAL | Status: DC
  Administered 2015-10-23 – 2015-10-24 (×3): 500 mg via ORAL
  Filled 2015-10-23 (×3): qty 1

## 2015-10-23 MED ORDER — FLUOXETINE HCL 20 MG PO CAPS
20.0000 mg | ORAL_CAPSULE | Freq: Every day | ORAL | Status: DC
  Filled 2015-10-23 (×2): qty 1

## 2015-10-23 NOTE — Consult Note (Signed)
[facility] Psychiatry Consult   Reason for Consult:  Consult for this 60 year old woman with severe depression Referring Physician:  Archie Balboa Patient Identification: Elizabeth Cherry MRN:  [id number] Principal Diagnosis: Severe recurrent major depression without psychotic features [facility]) Diagnosis:   Patient Active Problem List   Diagnosis Date Noted  . Severe recurrent major depression without psychotic features ([place]) [F33.2] 10/23/2015  . Hypertension [I10] 10/23/2015  . Self neglect [R46.89] 10/23/2015    Total Time spent with patient: 1 hour  Subjective:   Elizabeth Cherry is a 60 y.o. female patient admitted with "I'm a little depressed".  HPI:  Patient interviewed. Chart reviewed. Labs and vitals reviewed. Case discussed with TTS and emergency room physician. 60 year old woman brought in by Adult Protective Services under involuntary commitment because of suicidality and poor self-care. Patient admits that she is depressed. Says her mood is been depressed and down probably all of her life but especially for the last couple years. After she was discharged from the hospital last time she never followed up didn't stay on her medicine and has gone right back to being severely depressed. Mood is bad and negative all the time. She says she sleeps extremely poorly. She feels like she only sleeps a few hours a night. Appetite is poor and she is aware that she is losing weight. She is not taking any medications even for her medical problems. He is not following up with any medical care despite having significant medical symptoms. Patient says she has constant suicidal ideation although she says she has not acted on it. She gives the excuse that she has not thought of a reliable way to kill her self. Denies any use of alcohol or drugs. Patient admits that her self-care has been so bad that she has essentially been unwashed for probably a year or more.  Social history: Lives alone. Says that she  sometimes takes care of a neighbor. Not clear how much that is. Doesn't seem to have much social contact.  Medical history: History of high blood pressure and gastric reflux symptoms. Not taking any medicine for it now. She talks about how she has had gelatinous blood in her stool at times over the last year but has never gone for any medical treatment for it.  Substance abuse history: History of alcohol dependence but says she stopped drinking 20 years ago. Not abusing any alcohol or drugs now.  Past Psychiatric History: Patient has had a history of depression and lifelong. Last psychiatric hospitalization was here a little over a year ago. She has had suicide attempts in the past. She resists being compliant with outpatient treatment. Medications have been thought to work in the past but the patient is refusing to acknowledge this. Denies any psychotic symptoms.  Risk to Self: Is patient at risk for suicide?: Yes Risk to Others:   Prior Inpatient Therapy:   Prior Outpatient Therapy:    Past Medical History:  Past Medical History  Diagnosis Date  . Hypertension   . Depression   . Multiple gastric ulcers    No past surgical history on file. Family History: No family history on file. Family Psychiatric  History: Positive family history for mental illness with multiple members of the family with depression and some suicide in the family. Social History:  History  Alcohol Use No     History  Drug Use No    Social History   Social History  . Marital Status: Single    Spouse Name: N/A  .  Number of Children: N/A  . Years of Education: N/A   Social History Main Topics  . Smoking status: Current Every Day Smoker -- 1.00 packs/day    Types: Cigarettes  . Smokeless tobacco: Never Used  . Alcohol Use: No  . Drug Use: No  . Sexual Activity: Not Asked   Other Topics Concern  . None   Social History Narrative  . None   Additional Social History:    Allergies:   Allergies   Allergen Reactions  . Aspirin     Gastric ulcers   . Bactrim [Sulfamethoxazole-Trimethoprim] Hives  . Nsaids Other (See Comments)    Gastric ulcers     Labs:  Results for orders placed or performed during the hospital encounter of 10/23/15 (from the past 48 hour(s))  Comprehensive metabolic panel     Status: Abnormal   Collection Time: 10/23/15  2:44 PM  Result Value Ref Range   Sodium 135 135 - 145 mmol/L   Potassium 3.1 (L) 3.5 - 5.1 mmol/L   Chloride 104 101 - 111 mmol/L   CO2 22 22 - 32 mmol/L   Glucose, Bld 132 (H) 65 - 99 mg/dL   BUN 10 6 - 20 mg/dL   Creatinine, Ser 0.80 0.44 - 1.00 mg/dL   Calcium 9.4 8.9 - 10.3 mg/dL   Total Protein 7.0 6.5 - 8.1 g/dL   Albumin 4.5 3.5 - 5.0 g/dL   AST 16 15 - 41 U/L   ALT 9 (L) 14 - 54 U/L   Alkaline Phosphatase 63 38 - 126 U/L   Total Bilirubin 1.2 0.3 - 1.2 mg/dL   GFR calc non Af Amer >60 >60 mL/min   GFR calc Af Amer >60 >60 mL/min    Comment: (NOTE) The eGFR has been calculated using the CKD EPI equation. This calculation has not been validated in all clinical situations. eGFR's persistently <60 mL/min signify possible Chronic Kidney Disease.    Anion gap 9 5 - 15  Ethanol     Status: None   Collection Time: 10/23/15  2:44 PM  Result Value Ref Range   Alcohol, Ethyl (B) <5 <5 mg/dL    Comment:        LOWEST DETECTABLE LIMIT FOR SERUM ALCOHOL IS 5 mg/dL FOR MEDICAL PURPOSES ONLY   Salicylate level     Status: None   Collection Time: 10/23/15  2:44 PM  Result Value Ref Range   Salicylate Lvl <4.0 2.8 - 30.0 mg/dL  Acetaminophen level     Status: Abnormal   Collection Time: 10/23/15  2:44 PM  Result Value Ref Range   Acetaminophen (Tylenol), Serum <10 (L) 10 - 30 ug/mL    Comment:        THERAPEUTIC CONCENTRATIONS VARY SIGNIFICANTLY. A RANGE OF 10-30 ug/mL MAY BE AN EFFECTIVE CONCENTRATION FOR MANY PATIENTS. HOWEVER, SOME ARE BEST TREATED AT CONCENTRATIONS OUTSIDE THIS RANGE. ACETAMINOPHEN  CONCENTRATIONS >150 ug/mL AT 4 HOURS AFTER INGESTION AND >50 ug/mL AT 12 HOURS AFTER INGESTION ARE OFTEN ASSOCIATED WITH TOXIC REACTIONS.   cbc     Status: Abnormal   Collection Time: 10/23/15  2:44 PM  Result Value Ref Range   WBC 9.9 3.6 - 11.0 K/uL   RBC 5.46 (H) 3.80 - 5.20 MIL/uL   Hemoglobin 16.3 (H) 12.0 - 16.0 g/dL   HCT 47.3 (H) 35.0 - 47.0 %   MCV 86.6 80.0 - 100.0 fL   MCH 29.8 26.0 - 34.0 pg   MCHC 34.5 32.0 - 36.0  g/dL   RDW 13.7 11.5 - 14.5 %   Platelets 344 150 - 440 K/uL  Urine Drug Screen, Qualitative     Status: None   Collection Time: 10/23/15  2:44 PM  Result Value Ref Range   Tricyclic, Ur Screen NONE DETECTED NONE DETECTED   Amphetamines, Ur Screen NONE DETECTED NONE DETECTED   MDMA (Ecstasy)Ur Screen NONE DETECTED NONE DETECTED   Cocaine Metabolite,Ur [place] NONE DETECTED NONE DETECTED   Opiate, Ur Screen NONE DETECTED NONE DETECTED   Phencyclidine (PCP) Ur S NONE DETECTED NONE DETECTED   Cannabinoid 50 Ng, Ur [place] NONE DETECTED NONE DETECTED   Barbiturates, Ur Screen NONE DETECTED NONE DETECTED   Benzodiazepine, Ur Scrn NONE DETECTED NONE DETECTED   Methadone Scn, Ur NONE DETECTED NONE DETECTED    Comment: (NOTE) 219  Tricyclics, urine               Cutoff 1000 ng/mL 200  Amphetamines, urine             Cutoff 1000 ng/mL 300  MDMA (Ecstasy), urine           Cutoff 500 ng/mL 400  Cocaine Metabolite, urine       Cutoff 300 ng/mL 500  Opiate, urine                   Cutoff 300 ng/mL 600  Phencyclidine (PCP), urine      Cutoff 25 ng/mL 700  Cannabinoid, urine              Cutoff 50 ng/mL 800  Barbiturates, urine             Cutoff 200 ng/mL 900  Benzodiazepine, urine           Cutoff 200 ng/mL 1000 Methadone, urine                Cutoff 300 ng/mL 1100 1200 The urine drug screen provides only a preliminary, unconfirmed 1300 analytical test result and should not be used for non-medical 1400 purposes. Clinical consideration and professional judgment  should 1500 be applied to any positive drug screen result due to possible 1600 interfering substances. A more specific alternate chemical method 1700 must be used in order to obtain a confirmed analytical result.  1800 Gas chromato graphy / mass spectrometry (GC/MS) is the preferred 1900 confirmatory method.   Urinalysis complete, with microscopic ([facility] only)     Status: Abnormal   Collection Time: 10/23/15  4:00 PM  Result Value Ref Range   Color, Urine YELLOW (A) YELLOW   APPearance CLEAR (A) CLEAR   Glucose, UA NEGATIVE NEGATIVE mg/dL   Bilirubin Urine NEGATIVE NEGATIVE   Ketones, ur NEGATIVE NEGATIVE mg/dL   Specific Gravity, Urine 1.005 1.005 - 1.030   Hgb urine dipstick 1+ (A) NEGATIVE   pH 6.0 5.0 - 8.0   Protein, ur NEGATIVE NEGATIVE mg/dL   Nitrite NEGATIVE NEGATIVE   Leukocytes, UA 2+ (A) NEGATIVE   RBC / HPF 0-5 0 - 5 RBC/hpf   WBC, UA 6-30 0 - 5 WBC/hpf   Bacteria, UA NONE SEEN NONE SEEN   Squamous Epithelial / LPF 0-5 (A) NONE SEEN   Mucous PRESENT     No current facility-administered medications for this encounter.   No current outpatient prescriptions on file.    Musculoskeletal: Strength & Muscle Tone: decreased Gait & Station: normal Patient leans: N/A  Psychiatric Specialty Exam: Physical Exam  Nursing note and vitals reviewed. Constitutional: She appears well-developed  and well-nourished.  HENT:  Head: Normocephalic and atraumatic.  Eyes: Conjunctivae are normal. Pupils are equal, round, and reactive to light.  Neck: Normal range of motion.  Cardiovascular: Regular rhythm and normal heart sounds.   Respiratory: Effort normal. No respiratory distress.  GI: Soft.  Musculoskeletal: Normal range of motion.  Neurological: She is alert.  Skin: Skin is warm and dry.  Psychiatric: Her affect is blunt. Her speech is delayed. She is slowed and withdrawn. Cognition and memory are impaired. She expresses inappropriate judgment. She exhibits a depressed  mood. She expresses suicidal ideation. She expresses suicidal plans.    Review of Systems  Constitutional: Negative.   HENT: Negative.   Eyes: Negative.   Respiratory: Negative.   Cardiovascular: Negative.   Gastrointestinal: Negative.   Musculoskeletal: Negative.   Skin: Negative.   Neurological: Negative.   Psychiatric/Behavioral: Positive for depression and suicidal ideas. Negative for hallucinations, memory loss and substance abuse. The patient is nervous/anxious and has insomnia.     Blood pressure 119/96, pulse 118, temperature 98.7 F (37.1 C), temperature source Oral, resp. rate 18, height '5\' 6"'  (1.676 m), weight 54.432 kg (120 lb), SpO2 93 %.Body mass index is 19.38 kg/(m^2).  General Appearance: Disheveled and Patient is disheveled to truly remarkable degree. Body is filthy with caked dirt and hardened skin lesions all over.  Eye Contact:  Good  Speech:  Slow  Volume:  Decreased  Mood:  Depressed  Affect:  Constricted  Thought Process:  Goal Directed  Orientation:  Full (Time, Place, and Person)  Thought Content:  Denies hallucinations. Remarkably negative however. Refuses to admit that there is any benefit to getting treatment.  Suicidal Thoughts:  Yes.  with intent/plan  Homicidal Thoughts:  No  Memory:  Immediate;   Good Recent;   Good Remote;   Fair  Judgement:  Impaired  Insight:  Shallow  Psychomotor Activity:  Decreased  Concentration:  Concentration: Fair  Recall:  [employer] of Knowledge:  Fair  Language:  Fair  Akathisia:  No  Handed:  Right  AIMS (if indicated):     Assets:  [employer]ed forces logistics/support/administrative officer Housing Physical Health  ADL's:  Impaired  Cognition:  WNL  Sleep:        Treatment Plan Summary: Daily contact with patient to assess and evaluate symptoms and progress in treatment, Medication management and Plan 60 year old woman with a history of recurrent severe depression who presents as extremely depressed with extreme degrees of poor self-care.  Active suicidal ideation. Brought in by Adult [employer] with reports of filthy and unsafe living conditions. Patient is almost delusional he negative. She needed psychiatric hospitalization for safety. Up old commitment. Admit to psychiatric service. We can restart some medications as prescribed previously. Primary treatment team downstairs can make further decisions about appropriate treatment.  Disposition: Recommend psychiatric Inpatient admission when medically cleared. Supportive therapy provided about ongoing stressors.  Alethia Berthold, MD 10/23/2015 5:32 PM

## 2015-10-23 NOTE — ED Notes (Signed)
Pt refusing shower.

## 2015-10-23 NOTE — ED Notes (Addendum)
C/O "severe depression" for a while.  States having thoughts about hurting self.  Also admits to trying to hurt self "in different ways" for "a while".  Denies HI  Patient states she has been staying with a friend to "help them".  Has not been taking medicines for several months.  Medications include BP meds, anti depressant, and stomach medication.  Patient states she had stopped eating due to abdominal pain when eating.  States has not eaten in several days.

## 2015-10-23 NOTE — ED Notes (Signed)
Pt seen by Dr. Weber Cooks.

## 2015-10-23 NOTE — ED Provider Notes (Signed)
[facility] Emergency Department Provider Note   ____________________________________________  Time seen: ~1520  I have reviewed the triage vital signs and the nursing notes.   HISTORY  Chief Complaint Suicidal and Suicide Attempt   History limited by: Not Limited   HPI Elizabeth Cherry is a 60 y.o. female with history of long-standing depression who presents to the emergency department today at the request of sounds like social work because of continued depression. The patient has been depressed for years. She has not seen a therapist or psychiatrist in over a year. Likewise has not been on any antidepressant medications during that time. Additionally she has a history of high blood pressure but has not been on medication for that. She does say she has had a hard time sleeping. Additionally has lost weight due to lack of appetite. Denies any recent medical complaints. No recent fevers, chest pain shortness of breath or fevers.    Past Medical History  Diagnosis Date  . Hypertension   . Depression   . Multiple gastric ulcers     There are no active problems to display for this patient.   No past surgical history on file.  No current outpatient prescriptions on file.  Allergies Aspirin; Bactrim; and Nsaids  No family history on file.  Social History Social History  Substance Use Topics  . Smoking status: Current Every Day Smoker -- 1.00 packs/day    Types: Cigarettes  . Smokeless tobacco: Never Used  . Alcohol Use: No    Review of Systems  Constitutional: Negative for fever. Cardiovascular: Negative for chest pain. Respiratory: Negative for shortness of breath. Gastrointestinal: Negative for abdominal pain, vomiting and diarrhea. Neurological: Negative for headaches, focal weakness or numbness.   10-point ROS otherwise negative.  ____________________________________________   PHYSICAL EXAM:  VITAL SIGNS: ED Triage Vitals  Enc  Vitals Group     BP 10/23/15 1435 119/96 mmHg     Pulse Rate 10/23/15 1435 118     Resp 10/23/15 1435 18     Temp 10/23/15 1435 98.7 F (37.1 C)     Temp Source 10/23/15 1435 Oral     SpO2 10/23/15 1435 93 %     Weight 10/23/15 1435 120 lb (54.432 kg)     Height 10/23/15 1435 5\' 6"  (1.676 m)     Head Cir --      Peak Flow --      Pain Score 10/23/15 1438 0   Constitutional: Alert and oriented. Depressed. Eyes: Conjunctivae are normal. PERRL. Normal extraocular movements. ENT   Head: Normocephalic and atraumatic.   Nose: No congestion/rhinnorhea.   Mouth/Throat: Mucous membranes are moist.   Neck: No stridor. Hematological/Lymphatic/Immunilogical: No cervical lymphadenopathy. Cardiovascular: Normal rate, regular rhythm.  No murmurs, rubs, or gallops. Respiratory: Normal respiratory effort without tachypnea nor retractions. Breath sounds are clear and equal bilaterally. No wheezes/rales/rhonchi. Gastrointestinal: Soft and nontender. No distention.  Genitourinary: Deferred Musculoskeletal: Normal range of motion in all extremities. No joint effusions.  No lower extremity tenderness nor edema. Neurologic:  Normal speech and language. No gross focal neurologic deficits are appreciated.  Skin:  Skin is warm, dry and intact. No rash noted. Psychiatric: Depressed. Endorses suicidal ideation.  ____________________________________________    LABS (pertinent positives/negatives)  Labs Reviewed  COMPREHENSIVE METABOLIC PANEL - Abnormal; Notable for the following:    Potassium 3.1 (*)    Glucose, Bld 132 (*)    ALT 9 (*)    All other components within normal limits  ACETAMINOPHEN  LEVEL - Abnormal; Notable for the following:    Acetaminophen (Tylenol), Serum <10 (*)    All other components within normal limits  CBC - Abnormal; Notable for the following:    RBC 5.46 (*)    Hemoglobin 16.3 (*)    HCT 47.3 (*)    All other components within normal limits  URINALYSIS  COMPLETEWITH MICROSCOPIC ([facility] ONLY) - Abnormal; Notable for the following:    Color, Urine YELLOW (*)    APPearance CLEAR (*)    Hgb urine dipstick 1+ (*)    Leukocytes, UA 2+ (*)    Squamous Epithelial / LPF 0-5 (*)    All other components within normal limits  URINE CULTURE  ETHANOL  SALICYLATE LEVEL  URINE DRUG SCREEN, QUALITATIVE ([facility] ONLY)     ____________________________________________   EKG  None  ____________________________________________    RADIOLOGY  None   ____________________________________________   PROCEDURES  Procedure(s) performed: None  Critical Care performed: No  ____________________________________________   INITIAL IMPRESSION / ASSESSMENT AND PLAN / ED COURSE  Pertinent labs & imaging results that were available during my care of the patient were reviewed by me and considered in my medical decision making (see chart for details).  Patient presents to the emergency department today because of concerns for depression. Patient does state that she has suicidal ideation and has thought of plans. Given history of depression, recurrent symptoms and active suicidal ideation will place patient under involuntary commitment.  ----------------------------------------- 7:17 PM on 10/23/2015 -----------------------------------------  Patient was seen by psychiatry. They do recommend inpatient admission. Urine is concerning for possible urinary tract infection. Will start patient on Keflex. Additionally will send for urine culture. ____________________________________________   FINAL CLINICAL IMPRESSION(S) / ED DIAGNOSES  Final diagnoses:  Depression  UTI   Note: This dictation was prepared with Dragon dictation. Any transcriptional errors that result from this process are unintentional    Nance Pear, MD 10/23/15 1918
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
## 2015-10-23 NOTE — ED Notes (Addendum)
Patient is followed by Adult Protective Services and arrives accompanied by Social worker:  Vania Rea today.  Best contact number:  [phone].  Or [phone].

## 2015-10-23 NOTE — ED Notes (Signed)
Pt reporting she has been depressed with SI ideation for a long time. States no particular thing that make her depressed. Denies plan to hurt self or substance use. Pt states has not been to a doctor for a while and not taking her meds. Pt did not state the reason for not going to her PCP and not taking her meds.

## 2015-10-24 ENCOUNTER — Encounter: Payer: Self-pay | Admitting: Psychiatry

## 2015-10-24 ENCOUNTER — Inpatient Hospital Stay
Admission: EM | Admit: 2015-10-24 | Discharge: 2015-11-10 | DRG: 885 | Disposition: A | Discharge status: Home / Self Care | Payer: No Typology Code available for payment source | Source: Intra-hospital | Attending: Psychiatry | Admitting: Psychiatry

## 2015-10-24 DIAGNOSIS — Z681 Body mass index (BMI) 19 or less, adult: Secondary | ICD-10-CM | POA: Diagnosis not present

## 2015-10-24 DIAGNOSIS — G8929 Other chronic pain: Secondary | ICD-10-CM | POA: Diagnosis present

## 2015-10-24 DIAGNOSIS — Z888 Allergy status to other drugs, medicaments and biological substances status: Secondary | ICD-10-CM | POA: Diagnosis not present

## 2015-10-24 DIAGNOSIS — R627 Adult failure to thrive: Secondary | ICD-10-CM | POA: Diagnosis present

## 2015-10-24 DIAGNOSIS — A159 Respiratory tuberculosis unspecified: Secondary | ICD-10-CM

## 2015-10-24 DIAGNOSIS — R45851 Suicidal ideations: Secondary | ICD-10-CM | POA: Diagnosis present

## 2015-10-24 DIAGNOSIS — F332 Major depressive disorder, recurrent severe without psychotic features: Secondary | ICD-10-CM | POA: Diagnosis present

## 2015-10-24 DIAGNOSIS — E785 Hyperlipidemia, unspecified: Secondary | ICD-10-CM | POA: Diagnosis present

## 2015-10-24 DIAGNOSIS — F603 Borderline personality disorder: Secondary | ICD-10-CM

## 2015-10-24 DIAGNOSIS — K219 Gastro-esophageal reflux disease without esophagitis: Secondary | ICD-10-CM | POA: Diagnosis present

## 2015-10-24 DIAGNOSIS — E876 Hypokalemia: Secondary | ICD-10-CM | POA: Diagnosis present

## 2015-10-24 DIAGNOSIS — F17210 Nicotine dependence, cigarettes, uncomplicated: Secondary | ICD-10-CM | POA: Diagnosis present

## 2015-10-24 DIAGNOSIS — M797 Fibromyalgia: Secondary | ICD-10-CM | POA: Diagnosis present

## 2015-10-24 DIAGNOSIS — J449 Chronic obstructive pulmonary disease, unspecified: Secondary | ICD-10-CM | POA: Diagnosis present

## 2015-10-24 DIAGNOSIS — F329 Major depressive disorder, single episode, unspecified: Secondary | ICD-10-CM | POA: Diagnosis not present

## 2015-10-24 DIAGNOSIS — G4700 Insomnia, unspecified: Secondary | ICD-10-CM | POA: Diagnosis present

## 2015-10-24 DIAGNOSIS — F1021 Alcohol dependence, in remission: Secondary | ICD-10-CM | POA: Diagnosis present

## 2015-10-24 DIAGNOSIS — Z9119 Patient's noncompliance with other medical treatment and regimen: Secondary | ICD-10-CM | POA: Diagnosis not present

## 2015-10-24 DIAGNOSIS — K5900 Constipation, unspecified: Secondary | ICD-10-CM | POA: Diagnosis present

## 2015-10-24 DIAGNOSIS — Z62810 Personal history of physical and sexual abuse in childhood: Secondary | ICD-10-CM | POA: Diagnosis present

## 2015-10-24 DIAGNOSIS — Z8711 Personal history of peptic ulcer disease: Secondary | ICD-10-CM

## 2015-10-24 DIAGNOSIS — D126 Benign neoplasm of colon, unspecified: Secondary | ICD-10-CM | POA: Insufficient documentation

## 2015-10-24 DIAGNOSIS — E46 Unspecified protein-calorie malnutrition: Secondary | ICD-10-CM | POA: Diagnosis present

## 2015-10-24 DIAGNOSIS — F17200 Nicotine dependence, unspecified, uncomplicated: Secondary | ICD-10-CM

## 2015-10-24 DIAGNOSIS — N390 Urinary tract infection, site not specified: Secondary | ICD-10-CM | POA: Diagnosis present

## 2015-10-24 DIAGNOSIS — I10 Essential (primary) hypertension: Secondary | ICD-10-CM | POA: Diagnosis present

## 2015-10-24 DIAGNOSIS — R4689 Other symptoms and signs involving appearance and behavior: Secondary | ICD-10-CM | POA: Diagnosis present

## 2015-10-24 HISTORY — DX: Benign neoplasm of colon, unspecified: D12.6

## 2015-10-24 LAB — TSH: TSH: 1.51 u[IU]/mL (ref 0.350–4.500)

## 2015-10-24 LAB — LIPID PANEL
CHOL/HDL RATIO: 3.7 ratio
CHOLESTEROL: 187 mg/dL (ref 0–200)
HDL: 51 mg/dL (ref >40)
LDL CALC: 111 mg/dL — AB (ref 0–99)
Triglycerides: 124 mg/dL (ref <150)
VLDL: 25 mg/dL (ref 0–40)

## 2015-10-24 LAB — VITAMIN B12: VITAMIN B 12: 426 pg/mL (ref 180–914)

## 2015-10-24 MED ORDER — ACETAMINOPHEN 325 MG PO TABS
650.0000 mg | ORAL_TABLET | Freq: Four times a day (QID) | ORAL | Status: DC | PRN
  Administered 2015-11-10: 650 mg via ORAL
  Filled 2015-10-24: qty 2

## 2015-10-24 MED ORDER — POTASSIUM CHLORIDE CRYS ER 10 MEQ PO TBCR
40.0000 meq | EXTENDED_RELEASE_TABLET | Freq: Every day | ORAL | Status: DC
  Administered 2015-10-25: 40 meq via ORAL
  Filled 2015-10-24: qty 4

## 2015-10-24 MED ORDER — ENSURE ENLIVE PO LIQD
237.0000 mL | Freq: Three times a day (TID) | ORAL | Status: DC
  Administered 2015-10-24: 237 mL via ORAL

## 2015-10-24 MED ORDER — CLONAZEPAM 0.5 MG PO TABS
0.5000 mg | ORAL_TABLET | Freq: Every evening | ORAL | Status: DC | PRN
  Administered 2015-10-24 – 2015-10-25 (×2): 0.5 mg via ORAL
  Filled 2015-10-24 (×3): qty 1

## 2015-10-24 MED ORDER — HYDROXYZINE HCL 50 MG PO TABS
50.0000 mg | ORAL_TABLET | Freq: Every day | ORAL | Status: DC
  Administered 2015-10-24 – 2015-11-09 (×16): 50 mg via ORAL
  Filled 2015-10-24 (×20): qty 1

## 2015-10-24 MED ORDER — PANTOPRAZOLE SODIUM 40 MG PO TBEC
40.0000 mg | DELAYED_RELEASE_TABLET | Freq: Every day | ORAL | Status: DC
  Administered 2015-10-25 – 2015-11-10 (×16): 40 mg via ORAL
  Filled 2015-10-24 (×16): qty 1

## 2015-10-24 MED ORDER — ALBUTEROL SULFATE HFA 108 (90 BASE) MCG/ACT IN AERS
1.0000 | INHALATION_SPRAY | Freq: Four times a day (QID) | RESPIRATORY_TRACT | Status: DC | PRN
  Administered 2015-11-04: 2 via RESPIRATORY_TRACT
  Filled 2015-10-24: qty 6.7

## 2015-10-24 MED ORDER — MIRTAZAPINE 30 MG PO TABS
30.0000 mg | ORAL_TABLET | Freq: Every day | ORAL | Status: DC
  Filled 2015-10-24: qty 1

## 2015-10-24 MED ORDER — ALUM & MAG HYDROXIDE-SIMETH 200-200-20 MG/5ML PO SUSP: 30.0000 mL | ORAL | Status: DC | PRN

## 2015-10-24 MED ORDER — NITROFURANTOIN MONOHYD MACRO 100 MG PO CAPS
100.0000 mg | ORAL_CAPSULE | Freq: Two times a day (BID) | ORAL | Status: DC
  Administered 2015-10-24 – 2015-10-31 (×15): 100 mg via ORAL
  Filled 2015-10-24 (×15): qty 1

## 2015-10-24 MED ORDER — MAGNESIUM HYDROXIDE 400 MG/5ML PO SUSP: 30.0000 mL | Freq: Every day | ORAL | Status: DC | PRN

## 2015-10-24 MED ORDER — NICOTINE 21 MG/24HR TD PT24
21.0000 mg | MEDICATED_PATCH | Freq: Every day | TRANSDERMAL | Status: DC
  Administered 2015-10-25: 21 mg via TRANSDERMAL
  Filled 2015-10-24: qty 1

## 2015-10-24 MED ORDER — ADULT MULTIVITAMIN W/MINERALS CH
1.0000 | ORAL_TABLET | Freq: Every day | ORAL | Status: DC
  Administered 2015-10-27 – 2015-10-30 (×2): 1 via ORAL
  Filled 2015-10-24 (×5): qty 1

## 2015-10-24 NOTE — ED Notes (Signed)
Pt to be transferred to BMU. Report called to Marcie Bal, Therapist, sports. Belongings will be sent with pt.  Pt accepting. Maintained on 15 minute checks and observation by security camera for safety.

## 2015-10-24 NOTE — ED Notes (Signed)
Pt was given breakfast tray. Pt is eating breakfast at this time.

## 2015-10-24 NOTE — Progress Notes (Signed)
Patient admitted to unit, presents with flat, sad affect. Endorses depression with passive suicidal ideation. Pt currently has no plan and verbally contracts for safety. Pt reports becoming increasingly depressed for months and reports not sleeping at night.Pt has  extremely poor hygiene in which she states its probably been 2 years since she has bathed. Encouraged pt to bathe this evening.Pt also has history of hypertension. Oriented pt to room and unit, skin and contraband search completed and witnessed by [facility], Therapist, sports. Pt has large raised brown area to face and back. Discolorations to legs, arms and feet. Hair matted with particles throughout. Fluid and nutrition offered. Reviewed safety precautions for unit. Pt verbalized understanding and remains safe on unit with q 15 min checks.

## 2015-10-24 NOTE — ED Notes (Signed)
Patient is eating lunch and watching TV. No noted distress or abnormal behaviors noted. Will continue 15 minute checks and observation by security camera for safety.

## 2015-10-24 NOTE — ED Notes (Signed)
Pt sitting in dayroom interacting with another patient. Pt denies any needs/concerns. No distress noted. Maintained on 15 minute checks and observation by security camera for safety.

## 2015-10-24 NOTE — ED Notes (Signed)
Patient asleep in room. No noted distress or abnormal behavior. Will continue 15 minute checks and observation by security cameras for safety. 

## 2015-10-24 NOTE — ED Notes (Signed)
Patient assigned to appropriate care area. Patient oriented to unit/care area: Informed that, for their safety, care areas are designed for safety and monitored by security cameras at all times; and visiting hours explained to patient. Patient verbalizes understanding, and verbal contract for safety obtained.  When brought to [facility] pt stated, "I was brought here to die."  Pt refuses to shower and is unhappy with mattress. Pt also refused Prozac stating her throat would swell shut. MD notified. Order has been d/c. Pt given remote and is currently watching TV.   No distress noted. Maintained on 15 minute checks and observation by security camera for safety.
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
## 2015-10-24 NOTE — Plan of Care (Signed)
Problem: Safety: Goal: Periods of time without injury will increase Outcome: Progressing Pt remains free from harm.  Problem: Coping: Goal: Ability to verbalize feelings will improve Outcome: Progressing Pt is irritable upon approach. She verbalizes anger towards admission and "these beds are not comfortable."

## 2015-10-24 NOTE — ED Provider Notes (Signed)
Filed Vitals:   10/23/15 2336 10/24/15 0557  BP: 101/63 104/68  Pulse: 82 80  Temp:  98.2 F (36.8 C)  Resp: 16 18   No acute events overnight.  Seen by Dr. Weber Cooks, meeting inpatient criteria for depression.  Awaiting admission (full at [facility]), possible transfer.  Lisa Roca, MD 10/24/15 [phone]

## 2015-10-24 NOTE — ED Notes (Signed)
Patient resting quietly in room. No noted distress or abnormal behaviors noted. Will continue 15 minute checks and observation by security camera for safety. 

## 2015-10-24 NOTE — Tx Team (Signed)
Initial Interdisciplinary Treatment Plan   PATIENT STRESSORS: Health problems Medication change or noncompliance   PATIENT STRENGTHS: Average or above average intelligence Communication skills General fund of knowledge   PROBLEM LIST: Problem List/Patient Goals Date to be addressed Date deferred Reason deferred Estimated date of resolution  Major Depression      Suicidal Ideation                                                 DISCHARGE CRITERIA:  Improved stabilization in mood, thinking, and/or behavior  PRELIMINARY DISCHARGE PLAN: Outpatient therapy  PATIENT/FAMIILY INVOLVEMENT: This treatment plan has been presented to and reviewed with the patient, Elizabeth Cherry, and/or family member.  The patient and family have been given the opportunity to ask questions and make suggestions.  Floyde Parkins 10/24/2015, 6:15 PM

## 2015-10-24 NOTE — ED Notes (Signed)
Pt is up to use the bathroom this morning.

## 2015-10-25 LAB — HEMOGLOBIN A1C: HEMOGLOBIN A1C: 5.5 % (ref 4.0–6.0)

## 2015-10-25 LAB — URINE CULTURE

## 2015-10-25 LAB — PROLACTIN: PROLACTIN: 8.5 ng/mL (ref 4.8–23.3)

## 2015-10-25 MED ORDER — HYDROXYZINE HCL 25 MG PO TABS: 25.0000 mg | ORAL_TABLET | Freq: Four times a day (QID) | ORAL | Status: DC | PRN

## 2015-10-25 MED ORDER — HYDROXYZINE HCL 25 MG PO TABS
25.0000 mg | ORAL_TABLET | Freq: Four times a day (QID) | ORAL | Status: DC | PRN
  Filled 2015-10-25: qty 1

## 2015-10-25 MED ORDER — SERTRALINE HCL 25 MG PO TABS
25.0000 mg | ORAL_TABLET | Freq: Every day | ORAL | Status: DC
  Administered 2015-10-25 – 2015-10-26 (×2): 25 mg via ORAL
  Filled 2015-10-25 (×2): qty 1

## 2015-10-25 NOTE — BHH Suicide Risk Assessment (Signed)
[facility] Admission Suicide Risk Assessment   Nursing information obtained from:  Patient Demographic factors:  Unemployed Current Mental Status:  Suicidal ideation indicated by patient Loss Factors:  NA Historical Factors:  Prior suicide attempts Risk Reduction Factors:  NA  Total Time spent with patient: 1 hour Principal Problem: Severe recurrent major depression without psychotic features ([place]) Diagnosis:   Patient Active Problem List   Diagnosis Date Noted  . h/o Multiple gastric ulcers [K25.9] 10/24/2015  . Tobacco use disorder [F17.200] 10/24/2015  . Dyslipidemia [E78.5] 10/24/2015  . Benign neoplasm of colon [D12.6] 10/24/2015  . Allergic rhinitis [J30.9] 10/24/2015  . Alcohol use disorder, severe, in sustained remission ([place]) [F10.21] 10/24/2015  . Borderline personality disorder [F60.3] 10/24/2015  . Severe recurrent major depression without psychotic features ([place]) [F33.2] 10/23/2015  . Hypertension [I10] 10/23/2015  . Self neglect [R46.89] 10/23/2015  . Psoriasis [L40.9] 02/11/2012  . Chronic obstructive pulmonary disease ([place]) [J44.9] 11/15/2011  . Fibromyalgia [M79.7] 11/15/2011  . Chronic pain [G89.29] 10/24/2011   Subjective Data:   Continued Clinical Symptoms:  Alcohol Use Disorder Identification Test Final Score (AUDIT): 0 The "Alcohol Use Disorders Identification Test", Guidelines for Use in Primary Care, Second Edition.  World Pharmacologist [facility]). Score between 0-7:  no or low risk or alcohol related problems. Score between 8-15:  moderate risk of alcohol related problems. Score between 16-19:  high risk of alcohol related problems. Score 20 or above:  warrants further diagnostic evaluation for alcohol dependence and treatment.   CLINICAL FACTORS:   Severe Anxiety and/or Agitation Depression:   Severe Personality Disorders:   Cluster B Comorbid depression More than one psychiatric diagnosis Previous Psychiatric Diagnoses and  Treatments     Psychiatric Specialty Exam: Physical Exam  ROS  Blood pressure 125/89, pulse 98, temperature 98 F (36.7 C), temperature source Oral, resp. rate 18, height 5\' 6"  (1.676 m), weight 48.081 kg (106 lb), SpO2 98 %.Body mass index is 17.12 kg/(m^2).     COGNITIVE FEATURES THAT CONTRIBUTE TO RISK:  Closed-mindedness    SUICIDE RISK:   Moderate:  Frequent suicidal ideation with limited intensity, and duration, some specificity in terms of plans, no associated intent, good self-control, limited dysphoria/symptomatology, some risk factors present, and identifiable protective factors, including available and accessible social support.  PLAN OF CARE: admit to [facility]  I certify that inpatient services furnished can reasonably be expected to improve the patient's condition.   Hildred Priest, MD 10/25/2015, 12:03 PM

## 2015-10-25 NOTE — BHH Group Notes (Signed)
[employer] LCSW Group Therapy  10/25/2015 2:09 PM  Type of Therapy:  Group Therapy  Participation Level:  Did Not Attend  Modes of Intervention:  Discussion, Education, Socialization and Support  Summary of Progress/Problems: Self esteem: Patients discussed self esteem and how it impacts them. They discussed what aspects in their lives has influenced their self esteem. They were challenged to identify changes that are needed in order to improve self esteem.    [place] MSW, [place]  10/25/2015, 2:09 PM

## 2015-10-25 NOTE — Tx Team (Signed)
Initial Interdisciplinary Treatment Plan   PATIENT STRESSORS: Health problems Marital or family conflict Medication change or noncompliance   PATIENT STRENGTHS: Capable of independent living Supportive family/friends   PROBLEM LIST: Problem List/Patient Goals Date to be addressed Date deferred Reason deferred Estimated date of resolution  Bipolar Disorder  6/7           Medicine Non compliance  6/7                                          DISCHARGE CRITERIA:  Adequate post-discharge living arrangements Improved stabilization in mood, thinking, and/or behavior Verbal commitment to aftercare and medication compliance  PRELIMINARY DISCHARGE PLAN: Attend aftercare/continuing care group Outpatient therapy Participate in family therapy Return to previous living arrangement  PATIENT/FAMIILY INVOLVEMENT: This treatment plan has been presented to and reviewed with the patient, Elizabeth Cherry, and/or family member, .  The patient and family have been given the opportunity to ask questions and make suggestions.  Raul Del 10/25/2015, 6:29 PM

## 2015-10-25 NOTE — Progress Notes (Signed)
Patient with bright affect and cooperative with admission interview and assessment. Denies SI/HI at this time. Skin check with no contraband found. Skin checks with no wounds or bruises. Interpretor used for assessment. Dinner tray ordered. Oriented to unit. Patient ambulates through unit and looks into nurses station and smiles. Patient with no distress, no discomfort. Safety maintained.
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
## 2015-10-25 NOTE — Progress Notes (Signed)
D: Pt presents with an anxious and irritable affect this evening. She stays in her room throughout the night, only coming out for medications. Pt requests PRN klonopin for anxiety which she rates 10/10. She denies SI/HI/AVH at this time. Denies pain.  A: Emotional support and encouragement provided. Medications administered with education. Pt encouraged to bathe. q15 minute safety checks maintained. R: Pt remains free from harm. Does not appear to have bathed. Will continue to monitor.

## 2015-10-25 NOTE — Progress Notes (Signed)
Recreation Therapy Notes  Date: 06.07.17 Time: 9:30 am Location: Craft Room  Group Topic: Self-esteem  Goal Area(s) Addresses:  Patient will write at least one positive trait about self. Patient will verbalize benefit of having a healthy self-esteem.  Behavioral Response: Did not attend  Intervention: I Am  Activity: Patients were given a worksheet with the letter I on it and instructed to write as many positive traits inside the letter.  Education: LRT educated patients on ways they can increase their self-esteem.  Education Outcome: Patient did not attend group.  Clinical Observations/Feedback: Patient did not attend group.  Leonette Monarch, LRT/CTRS 10/25/2015 10:19 AM

## 2015-10-25 NOTE — Plan of Care (Signed)
Problem: Activity: Goal: Interest or engagement in activities will improve Outcome: Not Progressing Patient tired rt recent admit to hospital. Resting in bed, nauseous and drinking

## 2015-10-25 NOTE — Progress Notes (Signed)
CSW attemped a PSA today, but the pt declined citing nausea.  CSW will attempt the following day.  [name]. Shamell Suarez, LCSWA, LCAS  10/25/15

## 2015-10-25 NOTE — Progress Notes (Addendum)
Patient with depressed affect, no SI/HI at this time. Patient with withdrawn behavior, states she is tired rt recent admit. C/o nausea and ginger ale provided. Patient able to eat some breakfast and then takes am med. Refuses am nutritional shake, rt nausea. Refuses therapy groups rt feeling tired. Refuses afternoon shake, requesting chocolate shake. Writer phones dietary to request change to chocolate shakes. Minimal interaction with peers. Verbalizes needs well with staff. In bed in room during free time with curtains drawn closed. Safety maintained. Patient states it is to warm in room. Unwilling to leave her door open  to cool room off.

## 2015-10-26 MED ORDER — CLONAZEPAM 0.5 MG PO TABS
0.2500 mg | ORAL_TABLET | Freq: Three times a day (TID) | ORAL | Status: DC | PRN
  Administered 2015-10-26 – 2015-10-31 (×11): 0.25 mg via ORAL
  Filled 2015-10-26 (×10): qty 1

## 2015-10-26 MED ORDER — SERTRALINE HCL 25 MG PO TABS
25.0000 mg | ORAL_TABLET | Freq: Every day | ORAL | Status: DC
  Administered 2015-10-27 – 2015-10-28 (×2): 25 mg via ORAL
  Filled 2015-10-26 (×2): qty 1

## 2015-10-26 MED ORDER — CLONAZEPAM 0.5 MG PO TABS: 0.2500 mg | ORAL_TABLET | Freq: Three times a day (TID) | ORAL | Status: DC | PRN

## 2015-10-26 NOTE — BHH Group Notes (Signed)
[place] LCSW Group Therapy  10/26/2015 3:00 PM  Type of Therapy:  Group Therapy  Participation Level:  Did Not Attend  Modes of Intervention:  Discussion, Education, Socialization and Support  Summary of Progress/Problems: Balance in life: Patients will discuss the concept of balance and how it looks and feels to be unbalanced. Pt will identify areas in their life that is unbalanced and ways to become more balanced.    [place] MSW, [place]  10/26/2015, 3:00 PM

## 2015-10-26 NOTE — Progress Notes (Signed)
[facility] MD Progress Note  10/26/2015 9:50 AM Elizabeth Cherry  MRN:  [id number] Subjective:  Elizabeth Cherry is a 60 y.o. female with history of long-standing depression who presented to the emergency department on 6/5 at the request of social worker from [place] because of continued depression. The patient has been depressed for years. She has not seen a therapist or psychiatrist in over a year. Likewise has not been on any antidepressant medications during that time.She does say she has had a hard time sleeping, only resting about one hour at night. Additionally has lost weight due to lack of appetite. Patient obviously practicing extremely poor self-care habits as the pt presents as remarkable unkept.   Today the patient reports she is feeling a little bit better and states that she was able to sleep and eat better yesterday. She is wondering if she will be discharged back to her home tomorrow. She denies suicidality, homicidality or having auditory or visual hallucinations. She denies side effects from medications. She does complain of having problems with anxiety and would like to take something throughout the day as needed.  Patient is very distrustful of staff and can be argumentative at times.  Patient's oral intake is fair. She is slept about 7 hours last night.  Per nursing: D: Pt is much less irritable this evening. She is seen interacting with other pts while waiting for medications. Pt reports passive SI and contracts for safety. Denies HI/AVH at this time. Denies pain. A: Emotional support and encouragement provided. Medications administered with education. q15 minute safety checks maintained. R: Pt remains free from harm. Will continue to monitor.  Principal Problem: Severe recurrent major depression without psychotic features [facility]) Diagnosis:   Patient Active Problem List   Diagnosis Date Noted  . h/o Multiple gastric ulcers [K25.9] 10/24/2015  . Tobacco use disorder [F17.200] 10/24/2015  .  Dyslipidemia [E78.5] 10/24/2015  . Benign neoplasm of colon [D12.6] 10/24/2015  . Allergic rhinitis [J30.9] 10/24/2015  . Alcohol use disorder, severe, in sustained remission ([place]) [F10.21] 10/24/2015  . Borderline personality disorder [F60.3] 10/24/2015  . Severe recurrent major depression without psychotic features ([place]) [F33.2] 10/23/2015  . Hypertension [I10] 10/23/2015  . Self neglect [R46.89] 10/23/2015  . Psoriasis [L40.9] 02/11/2012  . Chronic obstructive pulmonary disease ([place]) [J44.9] 11/15/2011  . Fibromyalgia [M79.7] 11/15/2011  . Chronic pain [G89.29] 10/24/2011   Total Time spent with patient: 30 minutes  Past Psychiatric History:   Past Medical History:  Past Medical History  Diagnosis Date  . Hypertension   . Depression   . Multiple gastric ulcers    History reviewed. No pertinent past surgical history. Family History: History reviewed. No pertinent family history. Family Psychiatric  History:  Social History:  History  Alcohol Use No     History  Drug Use No    Social History   Social History  . Marital Status: Single    Spouse Name: N/A  . Number of Children: N/A  . Years of Education: N/A   Social History Main Topics  . Smoking status: Current Every Day Smoker -- 1.00 packs/day    Types: Cigarettes  . Smokeless tobacco: Never Used  . Alcohol Use: No  . Drug Use: No  . Sexual Activity: Not Asked   Other Topics Concern  . None   Social History Narrative     Current Medications: Current Facility-Administered Medications  Medication Dose Route Frequency Provider Last Rate Last Dose  . acetaminophen (TYLENOL) tablet 650 mg  650 mg  Oral Q6H PRN Gonzella Lex, MD      . albuterol (PROVENTIL HFA;VENTOLIN HFA) 108 (90 Base) MCG/ACT inhaler 1-2 puff  1-2 puff Inhalation Q6H PRN Hildred Priest, MD      . alum & mag hydroxide-simeth (MAALOX/MYLANTA) 200-200-20 MG/5ML suspension 30 mL  30 mL Oral Q4H PRN Gonzella Lex, MD      .  clonazePAM (KLONOPIN) tablet 0.25 mg  0.25 mg Oral TID PRN Hildred Priest, MD      . feeding supplement (ENSURE ENLIVE) (ENSURE ENLIVE) liquid 237 mL  237 mL Oral TID BM Hildred Priest, MD   237 mL at 10/24/15 1949  . hydrOXYzine (ATARAX/VISTARIL) tablet 50 mg  50 mg Oral QHS Hildred Priest, MD   50 mg at 10/25/15 2111  . magnesium hydroxide (MILK OF MAGNESIA) suspension 30 mL  30 mL Oral Daily PRN Gonzella Lex, MD      . multivitamin with minerals tablet 1 tablet  1 tablet Oral Daily Hildred Priest, MD   1 tablet at 10/24/15 1700  . nitrofurantoin (macrocrystal-monohydrate) (MACROBID) capsule 100 mg  100 mg Oral Q12H Hildred Priest, MD   100 mg at 10/26/15 0904  . pantoprazole (PROTONIX) EC tablet 40 mg  40 mg Oral Daily Hildred Priest, MD   40 mg at 10/26/15 0904  . [START ON 10/27/2015] sertraline (ZOLOFT) tablet 25 mg  25 mg Oral Q breakfast Hildred Priest, MD        Lab Results:  Results for orders placed or performed during the hospital encounter of 10/24/15 (from the past 48 hour(s))  Hemoglobin A1c     Status: None   Collection Time: 10/24/15  5:12 PM  Result Value Ref Range   Hgb A1c MFr Bld 5.5 4.0 - 6.0 %  Lipid panel, fasting     Status: Abnormal   Collection Time: 10/24/15  5:12 PM  Result Value Ref Range   Cholesterol 187 0 - 200 mg/dL   Triglycerides 124 <150 mg/dL   HDL 51 >40 mg/dL   Total CHOL/HDL Ratio 3.7 RATIO   VLDL 25 0 - 40 mg/dL   LDL Cholesterol 111 (H) 0 - 99 mg/dL    Comment:        Total Cholesterol/HDL:CHD Risk Coronary Heart Disease Risk Table                     Men   Women  1/2 Average Risk   3.4   3.3  Average Risk       5.0   4.4  2 X Average Risk   9.6   7.1  3 X Average Risk  23.4   11.0        Use the calculated Patient Ratio above and the CHD Risk Table to determine the patient's CHD Risk.        ATP III CLASSIFICATION (LDL):  <100     mg/dL   Optimal  100-129   mg/dL   Near or Above                    Optimal  130-159  mg/dL   Borderline  160-189  mg/dL   High  >190     mg/dL   Very High   Prolactin     Status: None   Collection Time: 10/24/15  5:12 PM  Result Value Ref Range   Prolactin 8.5 4.8 - 23.3 ng/mL    Comment: (NOTE) Performed At: [facility]  [place], [place] [id number] Lindon Romp MD [id number]   TSH     Status: None   Collection Time: 10/24/15  5:12 PM  Result Value Ref Range   TSH 1.510 0.350 - 4.500 uIU/mL  Vitamin B12     Status: None   Collection Time: 10/24/15  5:12 PM  Result Value Ref Range   Vitamin B-12 426 180 - 914 pg/mL    Comment: (NOTE) This assay is not validated for testing neonatal or myeloproliferative syndrome specimens for Vitamin B12 levels. Performed at [facility]     Blood Alcohol level:  Lab Results  Component Value Date   [facility] <5 10/23/2015    Physical Findings: AIMS: Facial and Oral Movements Muscles of Facial Expression: None, normal Lips and Perioral Area: None, normal Jaw: None, normal Tongue: None, normal,Extremity Movements Upper (arms, wrists, hands, fingers): None, normal Lower (legs, knees, ankles, toes): None, normal, Trunk Movements Neck, shoulders, hips: None, normal, Overall Severity Severity of abnormal movements (highest score from questions above): None, normal Incapacitation due to abnormal movements: None, normal Patient's awareness of abnormal movements (rate only patient's report): No Awareness, Dental Status Current problems with teeth and/or dentures?: No Does patient usually wear dentures?: No  CIWA:  CIWA-Ar Total: 0 COWS:  COWS Total Score: 0  Musculoskeletal: Strength & Muscle Tone: within normal limits Gait & Station: normal Patient leans: N/A  Psychiatric Specialty Exam: Physical Exam  Constitutional: She is oriented to person, place, and time. She appears well-developed.  underweight  HENT:  Head:  Normocephalic and atraumatic.  Eyes: EOM are normal.  Neck: Normal range of motion.  Respiratory: Effort normal.  Musculoskeletal: Normal range of motion.  Neurological: She is alert and oriented to person, place, and time.  Skin: Skin is dry.    Review of Systems  Constitutional: Negative.   HENT: Negative.   Eyes: Negative.   Respiratory: Negative.   Cardiovascular: Negative.   Gastrointestinal: Negative.   Genitourinary: Negative.   Musculoskeletal: Negative.   Skin: Negative.   Neurological: Negative.   Endo/Heme/Allergies: Negative.   Psychiatric/Behavioral: Positive for depression. Negative for suicidal ideas, hallucinations and substance abuse. The patient is nervous/anxious. The patient does not have insomnia.     Blood pressure 123/87, pulse 97, temperature 98.5 F (36.9 C), temperature source Oral, resp. rate 18, height 5\' 6"  (1.676 m), weight 48.081 kg (106 lb), SpO2 98 %.Body mass index is 17.12 kg/(m^2).  General Appearance: Disheveled, hair is matted   Eye Contact:  Good  Speech:  Clear and Coherent  Volume:  Normal  Mood:  Dysphoric  Affect:  Constricted  Thought Process:  Linear and Descriptions of Associations: Intact  Orientation:  Full (Time, Place, and Person)  Thought Content:  Hallucinations: None  Suicidal Thoughts:  No  Homicidal Thoughts:  No  Memory:  Immediate;   Good Recent;   Good Remote;   Good  Judgement:  Poor  Insight:  Shallow  Psychomotor Activity:  Decreased  Concentration:  Concentration: Good and Attention Span: Good  Recall:  Good  Fund of Knowledge:  Good  Language:  Good  Akathisia:  No  Handed:    AIMS (if indicated):     Assets:  Agricultural consultant Housing  ADL's:  Intact  Cognition:  WNL  Sleep:  Number of Hours: 7.5   Treatment Plan Summary:  Recurrent severe major depressive disorder: Patient almost in a catatonic state. She was brought in by social workers from [employer]. The patient only agrees with the starting treatment with Zoloft. Patient tells me she has been on Zoloft in the past and it worked well for her for about 7 years but eventually stopped helping her. Patient is not to augment antidepressant with lithium, Seroquel or Abilify. Despise educating her about the indication and reasoning for an augmenting strategy patient continues to state his medications are supposed to treat schizoaffective disorder and not depression.  -Continue zoloft 25 mg po q day  Anxiety: continue klonopin 0.5 mg tid prn  Insomnia: continue vistaril 50 mg po qhs   Self-care deficits/poor oral intake: Patient will be started on multivitamins with minerals and I will order supplemental drinks tid  Hypokalemia: completed  K Dur 40 mEq for 2 doses  UTI: the patient will be started on Macrobid 100 mg by mouth every 12 hours for 6 more days. Urine culture  was contaminated.  History of gastric ulcers: Patient has been started on Protonix 40 mg a day  COPD the patient will be started on albuterol when necessary  Tobacco use disorder: Patient declined from using nicotine patch  Skin lesions: Patient has multiple plagues on her face. She has history of psoriasis however the plaques do not appear to be psoriasis.   Labs vitamin B12, TSH, lipid panel, hemoglobin A1c and prolactin have been ordered.--- All results have been obtained and now and they are within the normal limits  Diet regular  Hospitalization and status continue involuntary commitment  Disposition and follow-up need to be determined. We will have to contact adult protective services who brought her to the hospital.  Social worker to contact them today   Hildred Priest, MD 10/26/2015, 9:50 AM

## 2015-10-26 NOTE — Progress Notes (Signed)
CSW attemped a PSA today, but the pt was sleeping and asked to complete the PSA another time. CSW will attempt the following day.  [name]. Karol Liendo, LCSWA, LCAS  10/26/15

## 2015-10-26 NOTE — Plan of Care (Signed)
Problem: Self-Concept: Goal: Level of anxiety will decrease Outcome: Progressing Pt appears less anxious this evening.

## 2015-10-26 NOTE — Progress Notes (Signed)
Recreation Therapy Notes  Date: 06.08.17 Time: 9:30 am Location: Craft Room  Group Topic: Leisure Education  Goal Area(s) Addresses:  Patient will identify things they are grateful for. Patient will verbalize why it is important to be grateful.  Behavioral Response: Did not attend  Intervention: Grateful Wheel  Activity: Patients were given an I Am Grateful For worksheet and instructed to write things they are grateful for under each category.  Education: LRT educated patients on why it is important to be grateful.  Education Outcome: Patient did not attend group.   Clinical Observations/Feedback: Patient did not attend group.  Leonette Monarch, LRT/CTRS 10/26/2015 10:24 AM

## 2015-10-26 NOTE — Progress Notes (Signed)
D: Pt is much less irritable this evening. She is seen interacting with other pts while waiting for medications. Pt reports passive SI and contracts for safety. Denies HI/AVH at this time. Denies pain. A: Emotional support and encouragement provided. Medications administered with education. q15 minute safety checks maintained. R: Pt remains free from harm. Will continue to monitor.
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
## 2015-10-26 NOTE — Progress Notes (Signed)
NUTRITION ASSESSMENT  Pt identified as at risk on the Malnutrition Screen Tool  INTERVENTION: 1. Encourage  the importance of nutrition and intake of food and beverages. 2. Encourage menu completion to best meet pt preferences 3. Continue Ensure TID  NUTRITION DIAGNOSIS: Unintentional weight loss related to sub-optimal intake as evidenced by pt report.   Goal: Pt to meet >/= 90% of their estimated nutrition needs.  Monitor:  PO intake  Assessment:    60 y.o. female admitted with severe recurrent depression without psychotic features  Past Medical History  Diagnosis Date  . Hypertension   . Depression   . Multiple gastric ulcers      Height: Ht Readings from Last 1 Encounters:  10/24/15 5\' 6"  (1.676 m)    Weight: Wt Readings from Last 1 Encounters:  10/24/15 106 lb (48.081 kg)    Weight Hx: Wt Readings from Last 10 Encounters:  10/24/15 106 lb (48.081 kg)  10/23/15 120 lb (54.432 kg)    BMI:  Body mass index is 17.12 kg/(m^2).   Estimated Nutritional Needs: Kcal: 25-30 kcal/kg Protein: > 1 gram protein/kg Fluid: 1 ml/kcal  Diet Order: DIET FINGER FOODS Room service appropriate?: Yes; Fluid consistency:: Thin Pt is also offered choice of unit snacks mid-morning and mid-afternoon.  Pt is eating as desired. Pt receiving Ensure TID, on MVI. Recorded po intake 41% on average  Lab results and medications reviewed.   Kerman Passey [place], [place], LDN [phone] Pager  [phone] Weekend/On-Call Pager

## 2015-10-26 NOTE — [facility] Group Notes (Signed)
[facility] Group Notes:  (Nursing/MHT/Case Management/Adjunct)  Date:  10/26/2015  Time:  11:18 PM  Type of Therapy:  Psychoeducational Skills  Participation Level:  Did Not Attend  Summary of Progress/Problems:  Elizabeth Cherry 10/26/2015, 11:18 PM

## 2015-10-26 NOTE — Progress Notes (Signed)
D: Patient refused  Milk supplement refused  Lunch and dinner  Remained in bed .  A: Education  On  Diet  And nutrition  R: Patient receptive , but continue to refused meal.

## 2015-10-27 MED ORDER — KETOCONAZOLE 2 % EX CREA
TOPICAL_CREAM | Freq: Two times a day (BID) | CUTANEOUS | Status: DC
  Administered 2015-10-28: 1 via TOPICAL
  Administered 2015-10-28 – 2015-10-30 (×5): via TOPICAL
  Administered 2015-10-31: 1 via TOPICAL
  Administered 2015-10-31 – 2015-11-01 (×2): via TOPICAL
  Administered 2015-11-02: 1 via TOPICAL
  Administered 2015-11-04 – 2015-11-09 (×6): via TOPICAL
  Filled 2015-10-27 (×3): qty 15

## 2015-10-27 MED ORDER — PROMETHAZINE HCL 25 MG PO TABS: 12.5000 mg | ORAL_TABLET | Freq: Four times a day (QID) | ORAL | Status: DC | PRN

## 2015-10-27 MED ORDER — ENSURE ENLIVE PO LIQD
237.0000 mL | Freq: Three times a day (TID) | ORAL | Status: DC
  Administered 2015-10-27: 237 mL via ORAL
  Administered 2015-10-27: 100 mL via ORAL
  Administered 2015-10-28 – 2015-11-01 (×8): 237 mL via ORAL

## 2015-10-27 MED ORDER — OLANZAPINE 10 MG PO TABS
5.0000 mg | ORAL_TABLET | Freq: Every day | ORAL | Status: DC
  Administered 2015-10-27 – 2015-10-29 (×3): 5 mg via ORAL
  Filled 2015-10-27 (×2): qty 1
  Filled 2015-10-27: qty 2
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
## 2015-10-27 NOTE — [facility] Group Notes (Signed)
[facility] Group Notes:  (Nursing/MHT/Case Management/Adjunct)  Date:  10/27/2015  Time:  4:09 PM  Type of Therapy:  Psychoeducational Skills  Participation Level:  Did Not Attend    Drake Leach 10/27/2015, 4:09 PM

## 2015-10-27 NOTE — Progress Notes (Signed)
Recreation Therapy Notes  Date: 06.09.17 Time: 9:30 am Location: Craft Room  Group Topic: Coping Skills  Goal Area(s) Addresses:  Patient will participate in coping skill. Patient will verbalize benefit of art as a coping skill.  Behavioral Response: Did not attend  Intervention: Coloring  Activity: Patients were given coloring sheets to color and were asked to think about the emotions they were feeling and what their mind was focused on.  Education: LRT educated patients on healthy coping skills.   Education Outcome: Patient did not attend group.   Clinical Observations/Feedback: Patient did not attend group.  Leonette Monarch, LRT/CTRS 10/27/2015 10:19 AM

## 2015-10-27 NOTE — Progress Notes (Signed)
D: Observed pt in room. Patient alert and oriented x4. Patient denies SI/HI/AVH. Pt affect is irritable anxious and sullen. Pt also sarcastic in regards to her mood stating "I'm just in such a lovely place." When writer attempted to ask further questions pt stated "I'll answer them in th morning" and went to bed. Pt later asked Probation officer for Klonopin for anxiety and sleep. A: Offered active listening and support. Provided therapeutic communication. Administered scheduled medications. Encouraged pt to attend group and discussed the benefits of attending. Gave Klonopin prn. R: Pt cooperative, but was irritated that she only got .25mg  of Klonopin. Pt medication compliant. Will continue Q15 min. checks. Safety maintained.

## 2015-10-27 NOTE — Plan of Care (Signed)
Problem: Coping: Goal: Ability to cope will improve Outcome: Not Progressing Pt isolative to room, irritable, requesting medication for anxiety. Coping does not appear to be improving

## 2015-10-27 NOTE — [facility] Group Notes (Addendum)
[facility] LCSW Group Therapy   10/27/2015 1pm  Type of Therapy: Group Therapy   Participation Level: Did Not Attend. Patient invited to participate but declined.    [name]. Haniel Fix, MSW, LCSWA, LCAS

## 2015-10-27 NOTE — Progress Notes (Addendum)
[facility] MD Progress Note  10/27/2015 10:48 AM Twisha Granneman  MRN:  [id number] Subjective:  Elizabeth Cherry is a 60 y.o. female with history of long-standing depression who presented to the emergency department on 6/5 at the request of social worker from [facility] because of continued depression. The patient has been depressed for years. She has not seen a therapist or psychiatrist in over a year. Likewise has not been on any antidepressant medications during that time.She does say she has had a hard time sleeping, only resting about one hour at night. Additionally has lost weight due to lack of appetite. Patient obviously practicing extremely poor self-care habits as the pt presents as remarkable unkept.   Today the patient reports she is not feeling well, complaints that she was unable to sleep well last night because the staff keeps opening her door at night. Pt has only been leaving her room for snacks and meals.  Not attending groups. This morning she was planning on taking a nap. She denies suicidality, homicidality or having auditory or visual hallucinations. She denies side effects from medications.   Agrees with adding low dose zyprexa to her medication regimen  Patient is very distrustful of staff and can be argumentative at times.  Patient's oral intake is was fair up until yesterday, yesterday she had 0 intake. She is slept about 7 hours last night.  Per nursing: D: Observed pt in room. Patient alert and oriented x4. Patient denies SI/HI/AVH. Pt affect is irritable anxious and sullen. Pt also sarcastic in regards to her mood stating "I'm just in such a lovely place." When writer attempted to ask further questions pt stated "I'll answer them in th morning" and went to bed. Pt later asked Probation officer for Klonopin for anxiety and sleep. A: Offered active listening and support. Provided therapeutic communication. Administered scheduled medications. Encouraged pt to attend group and discussed the benefits of  attending. Gave Klonopin prn. R: Pt cooperative, but was irritated that she only got .25mg  of Klonopin. Pt medication compliant. Will continue Q15 min. checks. Safety maintained.  Principal Problem: Severe recurrent major depression without psychotic features [facility]) Diagnosis:   Patient Active Problem List   Diagnosis Date Noted  . h/o Multiple gastric ulcers [K25.9] 10/24/2015  . Tobacco use disorder [F17.200] 10/24/2015  . Dyslipidemia [E78.5] 10/24/2015  . Benign neoplasm of colon [D12.6] 10/24/2015  . Allergic rhinitis [J30.9] 10/24/2015  . Alcohol use disorder, severe, in sustained remission ([place]) [F10.21] 10/24/2015  . Borderline personality disorder [F60.3] 10/24/2015  . Severe recurrent major depression without psychotic features ([place]) [F33.2] 10/23/2015  . Hypertension [I10] 10/23/2015  . Self neglect [R46.89] 10/23/2015  . Psoriasis [L40.9] 02/11/2012  . Chronic obstructive pulmonary disease ([place]) [J44.9] 11/15/2011  . Fibromyalgia [M79.7] 11/15/2011  . Chronic pain [G89.29] 10/24/2011   Total Time spent with patient: 30 minutes  Past Psychiatric History: Per records looks like this patient was hospitalized 3 years ago at [facility] after she overdosed on blood pressure medications. Patient says she was hospitalized at [facility] for an overdose on Restoril and antifreeze parking 2015. She was hospitalized here in our facility in February 2016 after she overdosed on Restoril. The patient required medical care prior to admission to psychiatry.   Patient has been tried on Prozac, Zoloft, Lexapro, Celexa, Wellbutrin, Cymbalta and mirtazapine. The patient reported back in 2016 that Prozac has been the best medication she tried however is now reported as an allergy. The records says that she developed swelling of her throat. The  patient said that solo was the second best medication but eventually stopped working after a few years.  In the past patient is to follow-up at [facility] in [facility]  however the patient is stopped going there after they started asking her for urine toxicity screen that were not covered by her insurance. The patient had to pay $175 for each of the urine test.  Past Medical History: Per review of records the patient has history of hypertension, chronic pain, fibromyalgia, COPD, gastric ulcers Past Medical History  Diagnosis Date  . Hypertension   . Depression   . Multiple gastric ulcers    History reviewed. No pertinent past surgical history.  Family History: History reviewed. No pertinent family history.   Family Psychiatric History: Pt reports a family history of MH and successful suicide attempts.   Tobacco Screening: Patient smokes one pack of cigarettes per day  Social History:  History  Alcohol Use No     History  Drug Use No    Social History   Social History  . Marital Status: Single    Spouse Name: N/A  . Number of Children: N/A  . Years of Education: N/A   Social History Main Topics  . Smoking status: Current Every Day Smoker -- 1.00 packs/day    Types: Cigarettes  . Smokeless tobacco: Never Used  . Alcohol Use: No  . Drug Use: No  . Sexual Activity: Not Asked   Other Topics Concern  . None   Social History Narrative     Current Medications: Current Facility-Administered Medications  Medication Dose Route Frequency Provider Last Rate Last Dose  . acetaminophen (TYLENOL) tablet 650 mg  650 mg Oral Q6H PRN Gonzella Lex, MD      . albuterol (PROVENTIL HFA;VENTOLIN HFA) 108 (90 Base) MCG/ACT inhaler 1-2 puff  1-2 puff Inhalation Q6H PRN Hildred Priest, MD      . alum & mag hydroxide-simeth (MAALOX/MYLANTA) 200-200-20 MG/5ML suspension 30 mL  30 mL Oral Q4H PRN Gonzella Lex, MD      . clonazePAM Bobbye [place]) tablet 0.25 mg  0.25 mg Oral TID PRN Hildred Priest, MD   0.25 mg at 10/26/15 2331  . feeding supplement (ENSURE ENLIVE) (ENSURE ENLIVE) liquid 237 mL  237 mL Oral TID BM Hildred Priest, MD   237 mL at 10/24/15 1949  . hydrOXYzine (ATARAX/VISTARIL) tablet 50 mg  50 mg Oral QHS Hildred Priest, MD   50 mg at 10/26/15 2134  . magnesium hydroxide (MILK OF MAGNESIA) suspension 30 mL  30 mL Oral Daily PRN Gonzella Lex, MD      . multivitamin with minerals tablet 1 tablet  1 tablet Oral Daily Hildred Priest, MD   1 tablet at 10/27/15 [phone]  . nitrofurantoin (macrocrystal-monohydrate) (MACROBID) capsule 100 mg  100 mg Oral Q12H Hildred Priest, MD   100 mg at 10/27/15 0928  . OLANZapine (ZYPREXA) tablet 5 mg  5 mg Oral QHS Hildred Priest, MD      . pantoprazole (PROTONIX) EC tablet 40 mg  40 mg Oral Daily Hildred Priest, MD   40 mg at 10/27/15 0928  . sertraline (ZOLOFT) tablet 25 mg  25 mg Oral Q breakfast Hildred Priest, MD   25 mg at 10/27/15 [id number]    Lab Results:  No results found for this or any previous visit (from the past 48 hour(s)).  Blood Alcohol level:  Lab Results  Component Value Date   [facility] <5 10/23/2015    Physical Findings: AIMS:  Facial and Oral Movements Muscles of Facial Expression: None, normal Lips and Perioral Area: None, normal Jaw: None, normal Tongue: None, normal,Extremity Movements Upper (arms, wrists, hands, fingers): None, normal Lower (legs, knees, ankles, toes): None, normal, Trunk Movements Neck, shoulders, hips: None, normal, Overall Severity Severity of abnormal movements (highest score from questions above): None, normal Incapacitation due to abnormal movements: None, normal Patient's awareness of abnormal movements (rate only patient's report): No Awareness, Dental Status Current problems with teeth and/or dentures?: No Does patient usually wear dentures?: No  CIWA:  CIWA-Ar Total: 0 COWS:  COWS Total Score: 0  Musculoskeletal: Strength & Muscle Tone: within normal limits Gait & Station: normal Patient leans: N/A  Psychiatric Specialty  Exam: Physical Exam  Constitutional: She is oriented to person, place, and time. She appears well-developed.  underweight  HENT:  Head: Normocephalic and atraumatic.  Eyes: EOM are normal.  Neck: Normal range of motion.  Respiratory: Effort normal.  Musculoskeletal: Normal range of motion.  Neurological: She is alert and oriented to person, place, and time.  Skin: Skin is dry.    Review of Systems  Constitutional: Negative.   HENT: Negative.   Eyes: Negative.   Respiratory: Negative.   Cardiovascular: Negative.   Gastrointestinal: Negative.   Genitourinary: Negative.   Musculoskeletal: Negative.   Skin: Negative.   Neurological: Negative.   Endo/Heme/Allergies: Negative.   Psychiatric/Behavioral: Positive for depression. Negative for suicidal ideas, hallucinations and substance abuse. The patient is nervous/anxious and has insomnia.     Blood pressure 118/89, pulse 98, temperature 97.7 F (36.5 C), temperature source Oral, resp. rate 18, height 5\' 6"  (1.676 m), weight 48.081 kg (106 lb), SpO2 98 %.Body mass index is 17.12 kg/(m^2).  General Appearance: Disheveled, hair is matted   Eye Contact:  Good  Speech:  Clear and Coherent  Volume:  Normal  Mood:  Dysphoric  Affect:  Constricted  Thought Process:  Linear and Descriptions of Associations: Intact  Orientation:  Full (Time, Place, and Person)  Thought Content:  Hallucinations: None  Suicidal Thoughts:  No  Homicidal Thoughts:  No  Memory:  Immediate;   Good Recent;   Good Remote;   Good  Judgement:  Poor  Insight:  Shallow  Psychomotor Activity:  Decreased  Concentration:  Concentration: Good and Attention Span: Good  Recall:  Good  Fund of Knowledge:  Good  Language:  Good  Akathisia:  No  Handed:    AIMS (if indicated):     Assets:  Agricultural consultant Housing  ADL's:  Intact  Cognition:  WNL  Sleep:  Number of Hours: 8.25   Treatment Plan Summary:  Recurrent severe  major depressive disorder: Patient almost in a catatonic state. She was brought in by social workers from [facility]. The patient only agrees with the starting treatment with Zoloft. Patient tells me she has been on Zoloft in the past and it worked well for her for about 7 years but eventually stopped helping her. Patient is not to augment antidepressant with lithium, Seroquel or Abilify. Despise educating her about the indication and reasoning for an augmenting strategy patient continues to state his medications are supposed to treat schizoaffective disorder and not depression.  -Continue zoloft 25 mg po q day -Will start zyprexa 5 mg po qhs to augment antidepressant and increase appetite  Anxiety: continue klonopin 0.25 mg tid prn  Insomnia: continue vistaril 50 mg po qhs   APS from [place] reported thatHer living conditions were poor/unsanitary. The patient  had written things on the walls of the house she is renting. She had multiple boxes and bottles of soda all over the house. Her landlord is planning on evicting her.  Self-care deficits/poor oral intake: Patient will be started on multivitamins with minerals and I will order supplemental drinks tid  Hypokalemia: completed  K Dur 40 mEq for 2 doses  UTI: the patient will be started on Macrobid 100 mg by mouth every 12 hours for 5 more days. Urine culture  was contaminated.  History of gastric ulcers: Patient has been started on Protonix 40 mg a day  COPD the patient will be started on albuterol when necessary   Tobacco use disorder: Patient declined from using nicotine patch  Skin lesions: Patient has multiple plaques on her face. She has history of psoriasis however the plaques do not appear to be psoriasis. Possible seborrheic dermatitis: will start ketoconazole 2 % bid.  Called a derm consult--derm will try to see her Monday  Labs vitamin B12, TSH, lipid panel, hemoglobin A1c and prolactin have been ordered.--- All  results have been obtained and now and they are within the normal limits  Diet regular  Hospitalization and status continue involuntary commitment  Disposition and follow-up need to be determined: per APS pt will be evicted from her house   Hildred Priest, MD 10/27/2015, 10:48 AM

## 2015-10-28 MED ORDER — SERTRALINE HCL 50 MG PO TABS
50.0000 mg | ORAL_TABLET | Freq: Every day | ORAL | Status: DC
  Administered 2015-10-29 – 2015-11-07 (×10): 50 mg via ORAL
  Filled 2015-10-28 (×11): qty 1
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
## 2015-10-28 NOTE — BHH Group Notes (Signed)
[facility] Group Notes:  (Nursing/MHT/Case Management/Adjunct)  Date:  10/28/2015  Time:  5:14 AM  Type of Therapy:  Psychoeducational Skills  Participation Level:  Did Not Attend  Summary of Progress/Problems:  Reece Agar 10/28/2015, 5:14 AM
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
## 2015-10-28 NOTE — BHH Group Notes (Signed)
[place] LCSW Group Therapy  10/28/2015 2:39 PM  Type of Therapy: Group Therapy  Participation Level: Did Not Attend  Modes of Intervention: Discussion, Education, Socialization and Support  Summary of Progress/Problems: Pt will identify unhealthy thoughts and how they impact their emotions and behavior. Pt will be encouraged to discuss these thoughts, emotions and behaviors with the group.   [place] MSW, [place]  10/28/2015, 2:39 PM

## 2015-10-28 NOTE — Progress Notes (Signed)
D:  Per pt self inventory pt reports sleeping good, appetite fair, energy level low, ability to pay attention good, rates depression at a 10 out of 10, hopelessness at a 12 out of 10, anxiety at a 9 out of 10, denies SI/HI/AVH, goal today: "going home", flat/anxious during interaction, frequently up at nurses station with requests and complaints.    A:  Emotional support provided, Encouraged pt to continue with treatment plan and attend all group activities, q15 min checks maintained for safety.  R:  Pt is not receptive, not going to groups, irritable with staff and minimally interacts with other patients on the unit.
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
## 2015-10-29 NOTE — [facility] Group Notes (Signed)
[place] LCSW Group Therapy  10/29/2015 2:01 PM  Type of Therapy:  Group Therapy  Participation Level:  Did Not Attend   Modes of Intervention:  Discussion, Education, Socialization and Support  Summary of Progress/Problems: Self esteem: Patients discussed self esteem and how it impacts them. They discussed what aspects in their lives has influenced their self esteem. They were challenged to identify changes that are needed in order to improve self esteem.    [place] MSW, [place]  10/29/2015, 2:01 PM

## 2015-10-29 NOTE — Progress Notes (Signed)
Pt has been pleasant and cooperative on shift thus far. Pt denies SI and A/V hallucinations . Pt continues to be active on the unit interacting with both staff and peers appropriately.  Patient was medication compliant, she is still needing to attend to to her hygiene efforts.

## 2015-10-29 NOTE — Progress Notes (Signed)
Affect depressed. Denies SI/AVH. Forwards little. Isolative. Poor hygiene. Medication compliant. Safety maintained. Will continue to monitor.

## 2015-10-29 NOTE — [facility] Group Notes (Signed)
[place] Group Notes:  (Nursing/MHT/Case Management/Adjunct)  Date:  10/29/2015  Time:  5:24 AM  Type of Therapy:  Psychoeducational Skills  Participation Level:  Did Not Attend  Summary of Progress/Problems:  Reece Agar 10/29/2015, 5:24 AM

## 2015-10-29 NOTE — Progress Notes (Signed)
Pt has been pleasant and some what cooperative.. Pt denies SI and A/V hallucinations . Pt continues to be active on the unit interacting with both staff and peers appropriately.Pt has exhibiting some med seeking behaviors.

## 2015-10-30 MED ORDER — OLANZAPINE 10 MG PO TABS
10.0000 mg | ORAL_TABLET | Freq: Every day | ORAL | Status: DC
  Administered 2015-10-30 – 2015-11-01 (×3): 10 mg via ORAL
  Filled 2015-10-30 (×3): qty 1

## 2015-10-30 NOTE — [facility] Group Notes (Signed)
[place] Group Notes:  (Nursing/MHT/Case Management/Adjunct)  Date:  10/30/2015  Time:  12:05 AM  Type of Therapy:  Group Therapy  Participation Level:  Active  Participation Quality:  Appropriate  Affect:  Anxious  Cognitive:  Alert  Insight:  Appropriate  Engagement in Group:  Defensive and Engaged  Modes of Intervention:  Discussion  Summary of Progress/Problems: Pt was positive when speaking of her goal of discharge. She became slightly argumentative with staff when discussing staff's need to do 15 minute checks at night. Pt agreed to keep her  bathroom light on.   Jenetta Downer Allisson Schindel 10/30/2015, 12:05 AM

## 2015-10-30 NOTE — Plan of Care (Signed)
Problem: Self-Concept: Goal: Ability to disclose and discuss suicidal ideas will improve Outcome: Progressing Denies suicidal ideations.
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
## 2015-10-30 NOTE — BHH Group Notes (Signed)
[place] LCSW Group Therapy  10/30/2015 2:45 PM  Type of Therapy:  Group Therapy  Participation Level:  Active  Participation Quality:  Appropriate  Affect:  Appropriate  Cognitive:  Appropriate  Insight:  Developing/Improving  Engagement in Therapy:  Developing/Improving  Modes of Intervention:  Discussion, Education and Exploration  Summary of Progress/Problems:LCSW reviewed group rules. In this afternoon group patient were asked to reflect on their current road blocks and how self care practices work for them. This patient identified that she has not come out her depression for the last 3 years. She struggled with serious medical issues and chronic pain lead to current mental state. She stated in group she wished she were dead. This patient was supported after group.  [place] 10/30/2015, 2:45 PM

## 2015-10-30 NOTE — Progress Notes (Signed)
Patient was more visible in the milieu today.Attended groups.Verbalized that she is not feeling good with her back today.But her affect is brighter.Denies suicidal or homicidal ideations and AV hallucinations.Asked for klonopin x2 for anxiety.Compliant with medications.Appetite and energy level fair.Poor hygiene.Encouaged for hygiene but refused.

## 2015-10-30 NOTE — Progress Notes (Signed)
Recreation Therapy Notes  Date: 06.12.17 Time: 9:30 am Location: Craft Room  Group Topic: Self-expression  Goal Area(s) Addresses:  Patient will effectively use art as a means of self-expression. Patient will be able to identify one emotion experienced during group session.  Behavioral Response: Did not attend  Intervention: Two Faces of Me  Activity: Patients were given a blank worksheet and instructed to draw a line down the middle. On one side of the face, patients were instructed to draw or write how they were feeling when they were admitted to the hospital. On the other side of the face, patients were instructed to draw or write how they want to feel when they are d/c.  Education: LRT educated group on different forms on self-expression.  Education Outcome: Patient did not attend group.  Clinical Observations/Feedback: Patient did not attend group.  Leonette Monarch, LRT/CTRS 10/30/2015 10:03 AM

## 2015-10-31 ENCOUNTER — Inpatient Hospital Stay: Payer: No Typology Code available for payment source

## 2015-10-31 MED ORDER — TUBERCULIN PPD 5 UNIT/0.1ML ID SOLN
5.0000 [IU] | Freq: Once | INTRADERMAL | Status: DC
  Filled 2015-10-31: qty 0.1

## 2015-10-31 MED ORDER — CLONAZEPAM 0.5 MG PO TABS
0.5000 mg | ORAL_TABLET | Freq: Three times a day (TID) | ORAL | Status: DC | PRN
Start: 2015-10-31 — End: 2015-11-08
  Administered 2015-10-31 – 2015-11-08 (×19): 0.5 mg via ORAL
  Filled 2015-10-31 (×20): qty 1

## 2015-10-31 NOTE — Progress Notes (Signed)
D:Patient  Remains to have an odor  Accompanying her , continue to have layer of  Tissue on face . Hair continues to be largely tangled and matted.  Appetite fair  Unable to use coping skills  This shift  Relied on her klonopin. Upset with the possibility of  Placement .  Continue to question  The motives of staff.   Stated to Probation officer she had saved 20 thousand dollars .  Patient later said  She didn't have it   A: Encourage patient participation with unit programming . Instruction  Given on  Medication , verbalize understanding. R: Voice no other concerns. Staff continue to monitor

## 2015-10-31 NOTE — Plan of Care (Signed)
Problem: Education: Goal: Emotional status will improve Outcome: Progressing Pt affect is brighter on approach.  Problem: Health Behavior/Discharge Planning: Goal: Compliance with therapeutic regimen will improve Outcome: Progressing Pt taking medications as prescribed. She asks questions appropriately.

## 2015-10-31 NOTE — BHH Group Notes (Signed)
[facility] LCSW Aftercare Discharge Planning Group Note  10/31/2015 11:02 AM  Participation Quality:  Appropriate  Affect:  Appropriate  Cognitive:  Appropriate  Insight:  Developing/Improving  Engagement in Group:  Developing/Improving  Modes of Intervention:  Discussion, Education, Exploration and Support  Summary of Progress/Problems:LCSW reviewed SMART goals with patients and explained the discharge process. Patient had several questions that were answered.  Her goal today was to try to eat 3 square meals a day and to practice self care.  [place] 10/31/2015, 11:02 AM

## 2015-10-31 NOTE — Plan of Care (Signed)
Problem: Medication: Goal: Compliance with prescribed medication regimen will improve Outcome: Progressing Pt taking medications as prescribed. Verbalizes understanding of teaching.

## 2015-10-31 NOTE — Progress Notes (Signed)
LCSW supported patient and found 2 tops,skirt and capris  ( small-petite sizes) as she relayed yesterday she had no clothes. LCSW accessed SW clothing cupboard to accommodate her needs. Patient was thankful and polite.  Vaden Becherer LCSW

## 2015-10-31 NOTE — BHH Group Notes (Signed)
[place] LCSW Group Therapy  10/31/2015 3:09 PM  Type of Therapy:  Group Therapy  Participation Level:  Minimal  Participation Quality:  Inattentive  Affect:  Irritable  Cognitive:  Appropriate  Insight:  Developing/Improving  Engagement in Therapy:  Developing/Improving  Modes of Intervention:  Confrontation and Support  Summary of Progress/Problems:LCSW reviewed group rules and had patients introduce themselves and relay what emotions they struggle with. As a collective group patient reported anger, sadness, grief, loss and happiness. Each group member was asked to reflect on their own strongest emotion and then peers would contribute positive ways to reduce/deal their emotional issues. This patient needed to leave group as another patient had an angry outburst before group started. Patient was given permission to retreat to her room and practice self care.  Enis Slipper M 10/31/2015, 3:09 PM

## 2015-10-31 NOTE — BHH Group Notes (Signed)
[place] Group Notes:  (Nursing/MHT/Case Management/Adjunct)  Date:  10/31/2015  Time:  10:27 PM  Type of Therapy:  Group Therapy  Participation Level:  Active  Participation Quality:  Appropriate  Affect:  Appropriate  Cognitive:  Appropriate  Insight:  Appropriate  Engagement in Group:  Engaged  Modes of Intervention:  Discussion  Summary of Progress/Problems:  Kandis Fantasia 10/31/2015, 10:27 PM

## 2015-10-31 NOTE — Progress Notes (Signed)
Recreation Therapy Notes  Date: 06.13.17 Time: 9:30 am Location: Craft Room  Group Topic: Goal Setting  Goal Area(s) Addresses:  Patient will write at least one goal. Patient will write at least one obstacle.  Behavioral Response: Attentive  Intervention: Recovery Goal Chart  Activity: Patients were instructed to make a Recovery Goal Chart including goals, obstacles, date they started working on their goal, and date they achieved their goal.  Education: LRT educated patients on ways they can celebrate reaching their goals.  Education Outcome: In group clarification offered  Clinical Observations/Feedback: Patient left group at approximately 9:40 am with Dr. Jerilee Hoh. Patient returned to group at approximately 10:05 am. Patient did not participate in group activity.  Leonette Monarch, LRT/CTRS 10/31/2015 10:18 AM

## 2015-10-31 NOTE — NC FL2 (Signed)
[place] LEVEL OF CARE SCREENING TOOL     IDENTIFICATION  Patient Name: Elizabeth Cherry Birthdate: 11-20-1955 Sex: female Admission Date (Current Location): 10/24/2015  [place] and [place] Number:  Engineering geologist and Address:  [facility], [address], [place], [place] [address]      Provider Number:    Attending Physician Name and Address:  Golden Hurter*  Relative Name and Phone Number:       Current Level of Care: Hospital Recommended Level of Care: [place] Prior Approval Number:    Date Approved/Denied:   PASRR Number:    Discharge Plan: Other (Comment) ([place])    Current Diagnoses: Patient Active Problem List   Diagnosis Date Noted  . h/o Multiple gastric ulcers 10/24/2015  . Tobacco use disorder 10/24/2015  . Dyslipidemia 10/24/2015  . Benign neoplasm of colon 10/24/2015  . Allergic rhinitis 10/24/2015  . Alcohol use disorder, severe, in sustained remission ([place]) 10/24/2015  . Borderline personality disorder 10/24/2015  . Severe recurrent major depression without psychotic features ([place]) 10/23/2015  . Hypertension 10/23/2015  . Self neglect 10/23/2015  . Psoriasis 02/11/2012  . Chronic obstructive pulmonary disease ([place]) 11/15/2011  . Fibromyalgia 11/15/2011  . Chronic pain 10/24/2011    Orientation RESPIRATION BLADDER Height & Weight     Self, Time, Situation  Normal Continent Weight: 106 lb (48.081 kg) Height:  5\' 6"  (167.6 cm)  BEHAVIORAL SYMPTOMS/MOOD NEUROLOGICAL BOWEL NUTRITION STATUS      Continent    AMBULATORY STATUS COMMUNICATION OF NEEDS Skin   Independent Verbally Other (Comment) (Seborrheic Dermatitis (no open sores))                       Personal Care Assistance Level of Assistance              Functional Limitations Info             SPECIAL CARE FACTORS FREQUENCY                       Contractures Contractures Info: Not present     Additional Factors Info                  Current Medications (10/31/2015):  This is the current hospital active medication list Current Facility-Administered Medications  Medication Dose Route Frequency Provider Last Rate Last Dose  . acetaminophen (TYLENOL) tablet 650 mg  650 mg Oral Q6H PRN Gonzella Lex, MD      . albuterol (PROVENTIL HFA;VENTOLIN HFA) 108 (90 Base) MCG/ACT inhaler 1-2 puff  1-2 puff Inhalation Q6H PRN Hildred Priest, MD      . alum & mag hydroxide-simeth (MAALOX/MYLANTA) 200-200-20 MG/5ML suspension 30 mL  30 mL Oral Q4H PRN Gonzella Lex, MD      . clonazePAM Bobbye [place]) tablet 0.5 mg  0.5 mg Oral TID PRN Hildred Priest, MD      . feeding supplement (ENSURE ENLIVE) (ENSURE ENLIVE) liquid 237 mL  237 mL Oral TID BM Hildred Priest, MD   237 mL at 10/31/15 1038  . hydrOXYzine (ATARAX/VISTARIL) tablet 50 mg  50 mg Oral QHS Hildred Priest, MD   50 mg at 10/30/15 2102  . ketoconazole (NIZORAL) 2 % cream   Topical BID Hildred Priest, MD   1 application at [id number]  . magnesium hydroxide (MILK OF MAGNESIA) suspension 30 mL  30 mL Oral Daily PRN Gonzella Lex, MD      .  multivitamin with minerals tablet 1 tablet  1 tablet Oral Daily Hildred Priest, MD   1 tablet at 10/30/15 0837  . nitrofurantoin (macrocrystal-monohydrate) (MACROBID) capsule 100 mg  100 mg Oral Q12H Hildred Priest, MD   100 mg at 10/31/15 1003  . OLANZapine (ZYPREXA) tablet 10 mg  10 mg Oral QHS Hildred Priest, MD   10 mg at 10/30/15 2102  . pantoprazole (PROTONIX) EC tablet 40 mg  40 mg Oral Daily Hildred Priest, MD   40 mg at 10/31/15 1003  . promethazine (PHENERGAN) tablet 12.5 mg  12.5 mg Oral Q6H PRN Hildred Priest, MD      . sertraline (ZOLOFT) tablet 50 mg  50 mg Oral Q breakfast Chauncey Mann, MD   50 mg at 10/31/15 0841  . tuberculin injection 5 Units  5 Units Intradermal Once Hildred Priest, MD   5 Units at 10/31/15 1214     Discharge Medications: Please see discharge summary for a list of discharge medications.  Relevant Imaging Results:  Relevant Lab Results:   Additional Information    [name] Mansi Tokar, LCSW

## 2015-10-31 NOTE — [facility] Group Notes (Signed)
[place] Group Notes:  (Nursing/MHT/Case Management/Adjunct)  Date:  10/31/2015  Time:  2:06 PM  Type of Therapy:  Psychoeducational Skills  Participation Level:  Active  Participation Quality:  Appropriate  Affect:  Appropriate  Cognitive:  Appropriate  Insight:  Appropriate  Engagement in Group:  Engaged  Modes of Intervention:  Discussion and Education  Summary of Progress/Problems:  Drake Leach 10/31/2015, 2:06 PM

## 2015-10-31 NOTE — Progress Notes (Signed)
D: Pt is pleasant and cooperative on approach. She is seen in the milieu interacting appropriately with staff and peers. Denies SI/HI/AVH at this time. Pt requests PRN medication for anxiety. She c/o back pain rated 8/10 and denies PRN medication. A: Emotional support and encouragement provided. Medications administered with education. q15 minute safety checks maintained. R: Pt remains free from harm. Will continue to monitor.

## 2015-10-31 NOTE — [facility] Group Notes (Signed)
[place] Group Notes:  (Nursing/MHT/Case Management/Adjunct)  Date:  10/31/2015  Time:  5:20 AM  Type of Therapy:  Psychoeducational Skills  Participation Level:  Active  Participation Quality:  Appropriate and Supportive  Affect:  Appropriate  Cognitive:  Appropriate  Insight:  Appropriate, Good and Improving  Engagement in Group:  Engaged and Improving  Modes of Intervention:  Discussion, Socialization and Support  Summary of Progress/Problems: Patient was very support to group members during group, and is improving on sharing her thoughts. Reece Agar 10/31/2015, 5:20 AM
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
## 2015-11-01 NOTE — Progress Notes (Signed)
D: Patient  Noted to remain close to room this shift . Limited interaction with peers and staff . Informed writer she is applying cream to her face ,Probation officer noted cup untounched. Voice concerns around placement . Voice she did not want to go to a group home , or  Even  Share a room with another client . Voice  To writer she could go home  To her apartment. . Noted to get advice from another patient on the unit, A: Encourage patient to come to staff for any concerns or issues needing to be addressed . Instructions given on medication , verbalize understanding. Writer addressing issues R: Voice no other concerns , staff continue tor monitor .

## 2015-11-01 NOTE — BHH Group Notes (Signed)
[place] Group Notes:  (Nursing/MHT/Case Management/Adjunct)  Date:  11/01/2015  Time:  5:10 PM  Type of Therapy:  Psychoeducational Skills  Participation Level:  Did Not Attend    Drake Leach 11/01/2015, 5:10 PM
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
## 2015-11-01 NOTE — Progress Notes (Signed)
Recreation Therapy Notes  Date: 06.14.17 Time: 9:30 am Location: Craft Room  Group Topic: Self-esteem  Goal Area(s) Addresses:  Patient will identify at least one positive trait about self. Patient will identify at least one healthy coping skill.  Behavioral Response: Did not attend  Intervention: All About Me  Activity: Patients were instructed to make a pamphlet including their life's motto, positive traits, healthy coping skills, and their support system.  Education: LRT educated patients on ways they can increase their self-esteem.  Education Outcome: Patient did not attend group.   Clinical Observations/Feedback: Patient did not attend group.  Leonette Monarch, LRT/CTRS 11/01/2015 1:11 PM

## 2015-11-01 NOTE — BHH Group Notes (Signed)
[place] Group Notes:  (Nursing/MHT/Case Management/Adjunct)  Date:  11/01/2015  Time:  10:39 PM  Type of Therapy:  Evening Wrap-up Group  Participation Level:  Did Not Attend  Participation Quality:  N/A  Affect:  N/A  Cognitive:  N/A  Insight:  None  Engagement in Group:  N/A  Modes of Intervention:  Discussion  Summary of Progress/Problems:  Levonne Spiller 11/01/2015, 10:39 PM

## 2015-11-01 NOTE — Plan of Care (Signed)
Problem: Coping: Goal: Ability to cope will improve Outcome: Progressing Patient improving with decision  Making .

## 2015-11-01 NOTE — Progress Notes (Signed)
D: Pt continues to be seen in the milieu interacting appropriately with staff and peers. She reports that her goal for today was "to eat more." Denies SI/HI/AVH at this time. Pt continues to c/o back pain rated 8/10 and refuses PRN Tylenol. A:  Emotional support and encouragement provided. Medications administered with education. q15 minute safety checks maintained. R: Pt remains free from harm. Will continue to monitor.

## 2015-11-01 NOTE — Progress Notes (Signed)
Patient ID: Elizabeth Cherry, female   DOB: May 14, 1956, 60 y.o.   MRN: [id number] CSW called [facility] who is with [name] DSS and who is assigned to the pt and informed him that the CSW has attempted to connect the pt with four group homes, with no success, due to her having BCBS Medicare and only $1700 a mo in disability (after BCBS is paid). CSW then informed Mr, [name] that two group homes are coming to interview the pt today.  [name]. [name], LCSWA, LCAS  11/01/15

## 2015-11-02 MED ORDER — SERTRALINE HCL 50 MG PO TABS: 50.0000 mg | ORAL_TABLET | Freq: Every day | ORAL | Status: DC

## 2015-11-02 MED ORDER — OLANZAPINE 15 MG PO TABS: 15.0000 mg | ORAL_TABLET | Freq: Every day | ORAL | Status: DC

## 2015-11-02 MED ORDER — PANTOPRAZOLE SODIUM 40 MG PO TBEC: 40.0000 mg | DELAYED_RELEASE_TABLET | Freq: Every day | ORAL | Status: DC

## 2015-11-02 MED ORDER — CLONAZEPAM 0.5 MG PO TABS: 0.5000 mg | ORAL_TABLET | Freq: Three times a day (TID) | ORAL | Status: DC | PRN

## 2015-11-02 MED ORDER — PROMETHAZINE HCL 25 MG PO TABS
12.5000 mg | ORAL_TABLET | Freq: Four times a day (QID) | ORAL | Status: DC | PRN
  Administered 2015-11-05 – 2015-11-08 (×2): 12.5 mg via ORAL
  Filled 2015-11-02 (×3): qty 1

## 2015-11-02 MED ORDER — HYDROXYZINE HCL 50 MG PO TABS
50.0000 mg | ORAL_TABLET | Freq: Every day | ORAL | Status: DC
Start: 2015-11-02 — End: 2015-11-09

## 2015-11-02 MED ORDER — KETOCONAZOLE 2 % EX CREA: TOPICAL_CREAM | Freq: Two times a day (BID) | CUTANEOUS | Status: DC

## 2015-11-02 MED ORDER — ALBUTEROL SULFATE HFA 108 (90 BASE) MCG/ACT IN AERS: 1.0000 | INHALATION_SPRAY | Freq: Four times a day (QID) | RESPIRATORY_TRACT | Status: DC | PRN

## 2015-11-02 MED ORDER — PROMETHAZINE HCL 25 MG PO TABS
12.5000 mg | ORAL_TABLET | Freq: Once | ORAL | Status: AC
  Administered 2015-11-02: 12.5 mg via ORAL
  Filled 2015-11-02: qty 1

## 2015-11-02 MED ORDER — OLANZAPINE 7.5 MG PO TABS
15.0000 mg | ORAL_TABLET | Freq: Every day | ORAL | Status: DC
  Administered 2015-11-02 – 2015-11-09 (×7): 15 mg via ORAL
  Filled 2015-11-02 (×8): qty 2

## 2015-11-02 NOTE — Progress Notes (Signed)
Recreation Therapy Notes  Date: 06.15.17 Time: 9:30 am Location: Craft Room  Group Topic: Leisure Education  Goal Area(s) Addresses:  Patient will identify activities for each letter of the alphabet. Patient will verbalize ability to integrate positive leisure into life post d/c. Patient will verbalize ability to use leisure as a Technical sales engineer.  Behavioral Response: Attentive  Intervention: Leisure Alphabet  Activity: Patients were given a Leisure Air traffic controller and instructed to identify leisure activities for each letter of the alphabet.  Education: LRT educated patients on what they need to participate in leisure.  Education Outcome: In group clarification offered   Clinical Observations/Feedback: Patient wrote down some leisure activities. Patient did not contribute to group discussion.  Leonette Monarch, LRT/CTRS 11/02/2015 10:28 AM

## 2015-11-02 NOTE — BHH Group Notes (Signed)
[facility] Group Notes:  (Nursing/MHT/Case Management/Adjunct)  Date:  11/02/2015  Time:  10:38 PM  Type of Therapy:  Group Therapy  Participation Level:  None  Participation Quality:  Intrusive  Affect:  Irritable  Cognitive:  irritable  Insight:  Limited  Engagement in Group:  Off Topic  Modes of Intervention:  Discussion  Summary of Progress/Problems: interrupted facilitator and peers to complain about staff and the unit. Was unable to stay on topic  Anaja Monts Anneka Kase Shughart 11/02/2015, 10:38 PM

## 2015-11-02 NOTE — Progress Notes (Signed)
November 03, 2015  Patient Identification: Maritess Gramer MRN:  [id number] Date of Evaluation:  10/25/2015 Chief Complaint:  severe recurrent major depression Principal Diagnosis: Severe recurrent major depression   To Adult Protective Services of [place]:  Laklyn Mcmackin is a 60 y.o. female with history of long-standing depression and borderline personality disorder who presented to [facility] emergency department on 6/5 at the request of social worker from [place] due to severe self-care deficits and suicidality.   Adult Protective Services reported they receive an anonymous complain.  They found Ms. Moschetto in deplorable conditions.  She lives in an apartment that was found in extreme unsanitary condition. The patient had written all over the walls.  The statements written have the theme of persecution and suspiciousness. The patient herself presents with poor hygiene and grooming, she is unkempt, has a strong body odor, her hair is completed matted and covered with a brown residue. Her face and neck are covered with large brown thick plaques. Her nutritional condition was also impacted as she appeared malnourished. The patient herself states that her apartment is covered with nags.  The patient reported that she has not complied with any psychiatric or medical treatment in more than a year.  This patient has prior history of at least 3 serious suicidal attempts.  Mrs.  Montanari was hospitalized 3 years ago at [facility] after she overdosed on blood pressure medications. She was admitted at [facility] for an overdose on Restoril and antifreeze in 2015.  She was again hospitalized here in our facility in February 2016 after she overdosed on Restoril. These overdoses have been severe and all have required medical stabilization and even ICU admission.  Ms. Koker has very limited support in the community. She identifies her mother as her only close relative. Her mother is an elderly  woman, who is about 58 years old. Multiple attempts have been made as to contact the mother with no success.  Since her admission into our facility the patient has remained suspicious and distrustful and argumentative with it treating team.  Her hygiene and oral intake has been poor.  Our recommendation has been for the patient to be discharged to a supervised living facility where staff can assure the patients safety, supervision and stability.  The patient however refuses and asks to be discharged back to her apartment. She has made multiple comments to social worker from [facility] that once discharged she will kill herself.  Clearly this patient is showing as evidence that she is unable to care for self, assure safety and stability. She is unable to take care of her hygiene and grooming and nutritional needs. She has been non-compliant for more than 12 months with any medical or psychiatric care despite of her severe history of suicidality and depression.  Patient has no real support in the community.  In my opinion she is making decisions that are not in her best interest and lacks of social capacity to make informed decisions.  She has been very difficult and argumentative throughout discharge planning.    Base on this information and my clinical assessment, I feel that at this point in time the best option is to have a guardian appointed to her. I'm supportive of at the protective services pursuing guardianship.  If more information is needed about this case please do not hesitate to contact me at [phone]  Sincerely,  Merlyn Albert MD [facility]

## 2015-11-02 NOTE — Plan of Care (Signed)
Problem: Coping: Goal: Ability to verbalize frustrations and anger appropriately will improve Outcome: Not Progressing Upset  With process being made with placement

## 2015-11-02 NOTE — Plan of Care (Signed)
Problem: Coping: Goal: Ability to verbalize frustrations and anger appropriately will improve Outcome: Progressing Patient is able to verbalize frustrations on this shift , gets argumentative with other patients [place] RN

## 2015-11-02 NOTE — BHH Group Notes (Signed)
[facility] LCSW Group Therapy   11/02/2015 9:30 am  Type of Therapy: Group Therapy   Participation Level: Did Not Attend. Patient invited to participate but declined.    [name]. Rylie Limburg, MSW, LCSWA, [place] Nakul Avino, LCSWA, LCAS  11/02/15

## 2015-11-02 NOTE — BHH Group Notes (Signed)
[facility] Group Notes:  (Nursing/MHT/Case Management/Adjunct)  Date:  11/02/2015  Time:  4:02 PM  Type of Therapy:  Movement Therapy  Participation Level:  Did Not Attend  Madonna Flegal De'Chelle Clydette Privitera 11/02/2015, 4:02 PM
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
## 2015-11-02 NOTE — Progress Notes (Signed)
D: Patient is alert and oriented on the unit this shift. Patient attended and actively participated in groups today. Patient denies suicidal ideation, homicidal ideation, auditory or visual hallucinations at the present time.  A: Scheduled medications are administered to patient as per MD orders. Emotional support and encouragement are provided. Patient is maintained on q.15 minute safety checks. Patient is informed to notify staff with questions or concerns. R: No adverse medication reactions are noted. Patient is cooperative with medication administration and treatment but argumentative with others . Patient is receptive,agitated  Argumentative with patients and staff   on the unit at this time. Patient does not  Interact  well with others on the unit this shift. Patient contracts for safety at this time. Patient remains safe at this time.

## 2015-11-02 NOTE — Progress Notes (Signed)
D: Patient limited interaction with peers . Noted very needy this shift  Constantly at nursing station for medication or needs in her room . No grooming observed   As with medication or bath or brushing her teeth.  Stated to Probation officer " they are wanting to put me in a boarding house" Voice of have sever anxiety this am shift. Patient refuses to talk about why she can't go back to  Her apartment  . Appetite  Poor, Affect afraid and fearful. A : Encourage patient participation and to come to staff for any other concerns . Encourage group participations . R: Voice no other concerns. Staff continue to monitor

## 2015-11-03 DIAGNOSIS — F332 Major depressive disorder, recurrent severe without psychotic features: Principal | ICD-10-CM

## 2015-11-03 LAB — URINE CULTURE: SPECIAL REQUESTS: NORMAL

## 2015-11-03 MED ORDER — ENSURE ENLIVE PO LIQD: 237.0000 mL | Freq: Three times a day (TID) | ORAL | Status: DC | PRN

## 2015-11-03 NOTE — Progress Notes (Signed)
Patient ID: Elizabeth Cherry, female   DOB: [birth year], 57 y.o.   MRN: [id number] MHT reported that the pt told another pt [name]) that the pt wants to die and that she intends to stop eating.    [name]. Avi Archuleta, LCSWA, LCAS 11/03/15

## 2015-11-03 NOTE — Progress Notes (Signed)
Patient has been lying in bed, she is quiet and flat, Patient does not participate in groups, Patient does take medication that is ordered with coaxing. Patient is safe, q 15 min checks. Patient will not use ointment for infection noted to face with dry scaly areas. Patient denies Si/hi and avh at this time, but she does state that she has been depressed since she was 60 years old. Nurse attempted to talk with Patient, but Patient states "I want to sleep,"

## 2015-11-03 NOTE — Progress Notes (Signed)
D: Patient is alert and oriented on the unit this shift. Patient does not  Attend and participate  in groups today. Patient denies suicidal ideation, homicidal ideation, auditory or visual hallucinations at the present time.  A: Scheduled medications are administered to patient as per MD orders. Emotional support and encouragement are provided. Patient is maintained on q.15 minute safety checks. Patient is informed to notify staff with questions or concerns. R: No adverse medication reactions are noted. Patient is cooperative with medication administration and treatment plan today. Patient is nonreceptive, calm and cooperative on the unit at this time.Patient can be demanding and argumentative at times with staff. Patient does not  Interact    with others on the unit this shift. Patient contracts for safety at this time. Patient remains safe at this time.

## 2015-11-03 NOTE — Plan of Care (Signed)
Problem: Coping: Goal: Ability to cope will improve Outcome: Progressing Goal : Patient to come out of room for all meals, and to eventually start groups to learn coping skills.            Patient to verbalize her issues that bother her emotionally and to express concerns with staff.

## 2015-11-03 NOTE — Progress Notes (Signed)
D: Patient has been very labile and intrusive during this shift. She's needy constantly asking for a Gatorade. She states passive SI and then says there's nothing in here to hurt herself with. She does contract for safety. States that when she leaves she'll get it done. She did attend group was was intrusive.  A: Medication was given with education. Encouragement was provided.  R: Patient was compliant with education. She has remained calm and cooperative. Safety maintained with 15 min checks.

## 2015-11-03 NOTE — [facility] Group Notes (Signed)
[facility] LCSW Group Therapy  11/03/2015 5:12 PM  Type of Therapy:  Group Therapy  Participation Level:  Did Not Attend  Modes of Intervention:  Discussion, Education, Socialization and Support  Summary of Progress/Problems:Feelings around Relapse. Group members discussed the meaning of relapse and shared personal stories of relapse, how it affected them and others, and how they perceived themselves during this time. Group members were encouraged to identify triggers, warning signs and coping skills used when facing the possibility of relapse. Social supports were discussed and explored in detail.   [facility] MSW, LCSWA  11/03/2015, 5:12 PM
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
## 2015-11-03 NOTE — Plan of Care (Signed)
Problem: Activity: Goal: Imbalance in normal sleep/wake cycle will improve Outcome: Progressing Slept 7.45 previous night.

## 2015-11-03 NOTE — Progress Notes (Signed)
Patient ID: Elizabeth Cherry, female   DOB: [birth year], 60 y.o.   MRN: [id number] Since the pt's admission on 10/24/15 the pt has repeatedly told the CSW and the pt's psychiatrist Dr. Jerilee Hoh that she will kill herself if she is discharged back to her apartment, and that she wants to live in independent housing and then later stated she wants to live in a family care or group home.  After telling the pt she does not physically qualify for a family care home the CSW was able to place the pt into the [facility] home for transitional living that is set up to transition the pt into independent living at some point.  The pt then refused to go stating if you send me to the group home, which the pt referred to as a "boarding house", then the pt will kill herself.  The pt then stated she desired to return to her apartment which her DSS agent Scott Honsucker stated she is schedule to be evicted from in approx 30 days.  The pt, Dr. Jerilee Hoh and the pt's DSS worker Nicki Reaper Honsucker have repeatedly told the pt she is to be evicted, but the pt insists on returning.  The pt's DSS agent, Mr. [name] is pursuing interim and then permanent guardianship, using a letter from Dr. Jerilee Hoh stating the pt's need for guardianship, to guard against the pt possibly attempting to hurt herself after being discharged.  [name]. Cosette Prindle, LCSWA, LCAS  11/03/15
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
## 2015-11-03 NOTE — Progress Notes (Signed)
Recreation Therapy Notes  Date: 06.16.17 Time: 9:30 am Location: Craft Room  Group Topic: Coping Skills  Goal Area(s) Addresses:  Patient will participate in healthy coping skill. Patient will verbalize using art as a coping skill.  Behavioral Response: Did not attend  Intervention: Coloring  Activity: Patients were given coloring sheets to color and were instructed to think about what their mind was focus on and what emotions they felt.  Education: LRT educated patients on healthy coping skills.  Education Outcome: Patient did not attend group.  Clinical Observations/Feedback: Patient did not attend group.  Leonette Monarch, LRT/CTRS 11/03/2015 10:24 AM

## 2015-11-03 NOTE — BHH Group Notes (Signed)
Independence Group Notes:  (Nursing/MHT/Case Management/Adjunct)  Date:  11/03/2015  Time:  4:56 PM  Type of Therapy:  Psychoeducational Skills  Participation Level:  Did Not Attend  Charise Killian 11/03/2015, 4:56 PM

## 2015-11-03 NOTE — Plan of Care (Signed)
Problem: Coping: Goal: Ability to verbalize frustrations and anger appropriately will improve Outcome: Progressing Patient is able to verbalize frustrations on unit at this time even though she is resistant to care. [place] RN

## 2015-11-03 NOTE — Progress Notes (Signed)
Patient did come out of room for dinner and ate about 50%, but went back to bed, nurse talked with her and she said she was really hungry, Patient had not eaten breakfast or lunch, nurse did order her hamburger and she did go to day room and ate well. Patient had flat affect earlier and would not talk with staff, but Patient did become slightly brighter and more talkative. Q 15 min. Checks. Patient denies plan to harm self at this time, but states " I pray that God will let me die" she states she has nothing to live for. Nurse did speak with Patient about inner peace, strength and faith.

## 2015-11-04 MED ORDER — ENSURE ENLIVE PO LIQD
237.0000 mL | Freq: Three times a day (TID) | ORAL | Status: DC
  Administered 2015-11-04 – 2015-11-09 (×10): 237 mL via ORAL
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
## 2015-11-04 NOTE — B[facility] Group Notes (Signed)
[facility] Group Notes:  (Nursing/MHT/Case Management/Adjunct)  Date:  11/04/2015  Time:  11:09 PM  Type of Therapy:  Psychoeducational Skills  Participation Level:  Active  Participation Quality:  Resistant  Affect:  Appropriate  Cognitive:  Appropriate  Insight:  None  Engagement in Group:  None  Modes of Intervention:  Discussion, Socialization and Support  Summary of Progress/Problems: Patient attended group the entire time but failed to participate in group discussion. Reece Agar 11/04/2015, 11:09 PM

## 2015-11-04 NOTE — Progress Notes (Signed)
Patient ID: Elizabeth Cherry, female   DOB: [birth year], 58 y.o.   MRN: [id number]   D: pt has been very demanding and blunted on the unit. Pt refused all morning medication, she refused breakfast, and refused to go to group. Pt was counseled regarding the importance of going to group and engaging in treatment. Pt attempted to go out side to recreation therapy, once outside she reported that she was having an asthma attack, and needed her inhaler. Pt took her inhaler, went to her room and went to sleep. Pt reported on her self inventory sheet that her depression was a 10, her hopelessness was a 10+1, and her anxiety was a 10. Pt reported being negative SI/HI, no AH/VH noted. A: 15 min checks continued for patient safety. R: Pt safety maintained.

## 2015-11-04 NOTE — BHH Group Notes (Signed)
[facility] Group Notes:  (Nursing/MHT/Case Management/Adjunct)  Date:  11/04/2015  Time:  1:32 AM  Type of Therapy:  Psychoeducational Skills  Participation Level:  Did Not Attend   Summary of Progress/Problems:  Jaxiel Kines R Hasana Alcorta 11/04/2015, 1:32 AM

## 2015-11-05 MED ORDER — POLYETHYLENE GLYCOL 3350 17 G PO PACK: 17.0000 g | PACK | Freq: Every day | ORAL | Status: DC | PRN

## 2015-11-05 MED ORDER — DOCUSATE SODIUM 100 MG PO CAPS
200.0000 mg | ORAL_CAPSULE | Freq: Two times a day (BID) | ORAL | Status: DC
  Administered 2015-11-05 – 2015-11-07 (×3): 200 mg via ORAL
  Administered 2015-11-08: 100 mg via ORAL
  Administered 2015-11-09: 200 mg via ORAL
  Filled 2015-11-05 (×10): qty 2

## 2015-11-05 NOTE — Progress Notes (Signed)
Patient ID: Elizabeth Cherry, female   DOB: 03-25-1956, 60 y.o.   MRN: [id number]    D: Pt continues to be very demanding and blunted on the unit. Pt refused all morning medication, she refused breakfast, and refused to go to group. Pt was counseled regarding the importance of going to group and engaging in treatment. Pt reported on her self inventory sheet that her depression was a 10 x10, her hopelessness was a 0, and her anxiety was a [id number]. Pt reported that her goal was an important plan. Pt reported being negative SI/HI, no AH/VH noted. A: 15 min checks continued for patient safety. R: Pt safety maintained.

## 2015-11-05 NOTE — Plan of Care (Signed)
Problem: Coping: Goal: Ability to verbalize feelings will improve Outcome: Progressing Pt approached writer to share that she was having suicidal thoughts, but was trying to be less isolative this evening to cope.

## 2015-11-05 NOTE — B[facility] Group Notes (Signed)
[facility] LCSW Group Therapy  11/05/2015 2:21 PM  Type of Therapy:  Group Therapy  Participation Level:  Minimal  Participation Quality:  Attentive  Affect:  Appropriate  Cognitive:  Alert  Insight:  Limited  Engagement in Therapy:  Limited  Modes of Intervention:  Discussion, Education, Socialization and Support  Summary of Progress/Problems: Self esteem: Patients discussed self esteem and how it impacts them. They discussed what aspects in their lives has influenced their self esteem. They were challenged to identify changes that are needed in order to improve self esteem.  Pt attended group and stayed the entire time. Pt sat quietly and listened to other group member share.   [place] MSW, [place]  11/05/2015, 2:21 PM
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
## 2015-11-05 NOTE — Progress Notes (Signed)
D: Pt approached writer this evening endorsing passive SI, and wanted to share her thoughts with staff. Pt verbally contracted for safety, and indicated that she was trying to stay in the day room so as not to focus on the suicidal thoughts. Pt did not endorse a plan. Patient alert and oriented x4. Patient denies HI/AVH. Pt affect is labile and depressed. Shortly after talking to pt about SI, pt was joking and laughing with Probation officer. Pt c/o of constipation. A: Offered active listening and support. Provided therapeutic communication. Administered scheduled medications. Encouraged pt to continue to reach out to staff with SI or any other concerns or needs. Offered milk of magnesia for constipation. R: Pt pleasant and cooperative. Pt medication compliant. Pt refused milk of magnesia, stating "it makes me sick." Writer will pass on to next shift. Will continue Q15 min. checks. Safety maintained.

## 2015-11-06 MED ORDER — FOSFOMYCIN TROMETHAMINE 3 G PO PACK
3.0000 g | PACK | Freq: Once | ORAL | Status: AC
  Administered 2015-11-06: 3 g via ORAL
  Filled 2015-11-06: qty 3

## 2015-11-06 MED ORDER — ZOLPIDEM TARTRATE 5 MG PO TABS
5.0000 mg | ORAL_TABLET | Freq: Every day | ORAL | Status: DC
  Administered 2015-11-06: 5 mg via ORAL
  Filled 2015-11-06: qty 1
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
## 2015-11-06 NOTE — [facility] Group Notes (Signed)
[facility] Group Notes:  (Nursing/MHT/Case Management/Adjunct)  Date:  11/06/2015  Time:  5:36 PM  Type of Therapy:  Psychoeducational Skills  Participation Level:  Minimal  Participation Quality:  Appropriate and Resistant  Affect:  Appropriate  Cognitive:  Appropriate  Insight:  Appropriate  Engagement in Group:  Poor  Modes of Intervention:  Discussion, Education and Support  Summary of Progress/Problems:  Adela Lank [facility] 11/06/2015, 5:36 PM

## 2015-11-06 NOTE — Plan of Care (Signed)
Problem: Medication: Goal: Compliance with prescribed medication regimen will improve Outcome: Progressing Pt compliant with medications this shift.

## 2015-11-06 NOTE — Progress Notes (Signed)
Recreation Therapy Notes  Date: 06.19.17 Time: 9:30 am Location: Craft Room  Group Topic: Self-expression  Goal Area(s) Addresses:  Patient will identify one color per emotion listed on wheel. Patient will verbalize one emotion experienced during session. Patient will be educated on other forms of self-expression.  Behavioral Response: Attentive, Interactive  Intervention: Emotion Wheel  Activity: Patients were given a worksheet with seven emotions on it and were instructed to pick a color for each emotion.  Education: LRT educated patients on other forms of self-expression.  Education Outcome: Acknowledges education/In group clarification offered  Clinical Observations/Feedback: Patient completed activity by picking a color for each emotion. Patient contributed to group discussion by stating what colors she picked for certain emotions and why.  Leonette Monarch, LRT/CTRS 11/06/2015 10:17 AM

## 2015-11-06 NOTE — B[facility] Group Notes (Signed)
[facility] LCSW Group Therapy  11/06/2015 2:49 PM  Type of Therapy:  Group Therapy  Participation Level:  Active  Participation Quality:  Intrusive  Affect:  Defensive  Cognitive:  Alert  Insight:  Developing/Improving  Engagement in Therapy:  Developing/Improving  Modes of Intervention:  Discussion, Education, Exploration and Support  Summary of Progress/Problems: LCSW reviewed group rules and introduced todays topics overcoming obstacles. This patient reported her treatment at is unfair, she was instructed to take a medication today and reported she was not explained why she needed it. She refused medications. This worker with the support of her peers explained medications are essential for her own wellness and recovery. She was encouraged to speak to nurse for further explaination or her psychiatrist. This patient had to be gently redirected as she was encouraging her peers to reflect on all the issues in the hospital and her care. LCSW suggested to patient her obstacle is not connecting with outside community resources. She agreed and understands she will need a follow up appointment and to take her medications when in the community or she will likely get sick again and return to hospital. Patient reflected she will never come back to [facility] and will throw herself into traffic. Pt was challenged, questioned if she was experiencing suicidal thoughts- pt denied and reports she is not suicidal and that she was just venting. Pt redirected throughout group.  Drew 11/06/2015, 2:49 PM

## 2015-11-06 NOTE — Progress Notes (Signed)
D: Observed pt in day room playing cards with peers . Patient alert and oriented x4. Patient denies SI/HI/AVH. Pt affect is labile. Pt less irritable this evening. Pt was very preoccupied with her door being closed after 15 min checks, and discussed this for a time with Probation officer. Pt still didn't forward much with writer this evening, and when writer attempted to ask about mood, pt stated "I'm tired." Pt would not offer any more information this evening. Pt asked for klonopin. A: Offered active listening and support. Provided therapeutic communication. Administered scheduled medications. Gave Klonopin prn. R: Pt cooperative. Pt medication compliant. Will continue Q15 min. checks. Safety maintained.

## 2015-11-06 NOTE — Progress Notes (Signed)
[facility] MD Progress Note  11/06/2015 12:01 PM Elizabeth Cherry  MRN:  [id number]  Subjective:  Elizabeth Cherry is a little upset today as both hernias breakfast and lunch were not ordered appropriately. She complains of poor appetite anyway and has difficulties drinking ensure. She complains of interrupted sleep. With Vistaril, she is able to fall asleep but not to maintain it. She tells me that she found a friend with whom she could stay and is asking to be discharged. She has no somatic complaints. She accepts medications and tolerates them well. Her hygiene is questionable and her her hairdo awful.   Principal Problem: Severe recurrent major depression without psychotic features ([place]) Diagnosis:   Patient Active Problem List   Diagnosis Date Noted  . Tobacco use disorder [F17.200] 10/24/2015  . Benign neoplasm of colon [D12.6] 10/24/2015  . Alcohol use disorder, severe, in sustained remission ([place]) [F10.21] 10/24/2015  . Borderline personality disorder [F60.3] 10/24/2015  . Severe recurrent major depression without psychotic features ([place]) [F33.2] 10/23/2015  . Hypertension [I10] 10/23/2015  . Self neglect [R46.89] 10/23/2015  . Psoriasis [L40.9] 02/11/2012  . Chronic obstructive pulmonary disease ([place]) [J44.9] 11/15/2011  . Fibromyalgia [M79.7] 11/15/2011  . Chronic pain [G89.29] 10/24/2011   Total Time spent with patient: 20 minutes  Past Psychiatric History: Depression.  Past Medical History:  Past Medical History  Diagnosis Date  . Hypertension   . Depression   . Multiple gastric ulcers    History reviewed. No pertinent past surgical history. Family History: History reviewed. No pertinent family history. Family Psychiatric  History: See H&P. Social History:  History  Alcohol Use No     History  Drug Use No    Social History   Social History  . Marital Status: Single    Spouse Name: N/A  . Number of Children: N/A  . Years of Education: N/A   Social History Main Topics   . Smoking status: Current Every Day Smoker -- 1.00 packs/day    Types: Cigarettes  . Smokeless tobacco: Never Used  . Alcohol Use: No  . Drug Use: No  . Sexual Activity: Not Asked   Other Topics Concern  . None   Social History Narrative   Additional Social History:                         Sleep: Poor  Appetite:  Poor  Current Medications: Current Facility-Administered Medications  Medication Dose Route Frequency Provider Last Rate Last Dose  . acetaminophen (TYLENOL) tablet 650 mg  650 mg Oral Q6H PRN Gonzella Lex, MD      . albuterol (PROVENTIL HFA;VENTOLIN HFA) 108 (90 Base) MCG/ACT inhaler 1-2 puff  1-2 puff Inhalation Q6H PRN Hildred Priest, MD   2 puff at 11/04/15 1327  . alum & mag hydroxide-simeth (MAALOX/MYLANTA) 200-200-20 MG/5ML suspension 30 mL  30 mL Oral Q4H PRN Gonzella Lex, MD      . clonazePAM Bobbye [place]) tablet 0.5 mg  0.5 mg Oral TID PRN Hildred Priest, MD   0.5 mg at 11/06/15 0902  . docusate sodium (COLACE) capsule 200 mg  200 mg Oral BID Hildred Priest, MD   200 mg at 11/05/15 2134  . feeding supplement (ENSURE ENLIVE) (ENSURE ENLIVE) liquid 237 mL  237 mL Oral TID BM Hildred Priest, MD   237 mL at 11/05/15 2140  . fosfomycin (MONUROL) packet 3 g  3 g Oral Once Clovis Fredrickson, MD      .  hydrOXYzine (ATARAX/VISTARIL) tablet 50 mg  50 mg Oral QHS Hildred Priest, MD   50 mg at 11/05/15 2134  . ketoconazole (NIZORAL) 2 % cream   Topical BID Hildred Priest, MD      . magnesium hydroxide (MILK OF MAGNESIA) suspension 30 mL  30 mL Oral Daily PRN Gonzella Lex, MD      . OLANZapine (ZYPREXA) tablet 15 mg  15 mg Oral QHS Hildred Priest, MD   15 mg at 11/05/15 2134  . pantoprazole (PROTONIX) EC tablet 40 mg  40 mg Oral Daily Hildred Priest, MD   40 mg at 11/06/15 0903  . polyethylene glycol (MIRALAX / GLYCOLAX) packet 17 g  17 g Oral Daily PRN Hildred Priest, MD      . promethazine (PHENERGAN) tablet 12.5 mg  12.5 mg Oral Q6H PRN Hildred Priest, MD   12.5 mg at 11/05/15 0931  . sertraline (ZOLOFT) tablet 50 mg  50 mg Oral Q breakfast Chauncey Mann, MD   50 mg at 11/06/15 5498    Lab Results: No results found for this or any previous visit (from the past 48 hour(s)).  Blood Alcohol level:  Lab Results  Component Value Date   ETH <5 10/23/2015    Physical Findings: AIMS: Facial and Oral Movements Muscles of Facial Expression: None, normal Lips and Perioral Area: None, normal Jaw: None, normal Tongue: None, normal,Extremity Movements Upper (arms, wrists, hands, fingers): None, normal Lower (legs, knees, ankles, toes): None, normal, Trunk Movements Neck, shoulders, hips: None, normal, Overall Severity Severity of abnormal movements (highest score from questions above): None, normal Incapacitation due to abnormal movements: None, normal Patient's awareness of abnormal movements (rate only patient's report): No Awareness, Dental Status Current problems with teeth and/or dentures?: No Does patient usually wear dentures?: No  CIWA:  CIWA-Ar Total: 0 COWS:  COWS Total Score: 0  Musculoskeletal: Strength & Muscle Tone: within normal limits Gait & Station: normal Patient leans: N/A  Psychiatric Specialty Exam: Physical Exam  Nursing note and vitals reviewed.   Review of Systems  Psychiatric/Behavioral: The patient is nervous/anxious.   All other systems reviewed and are negative.   Blood pressure 108/70, pulse 81, temperature 97.9 F (36.6 C), temperature source Oral, resp. rate 18, height '5\' 6"'  (1.676 m), weight 48.081 kg (106 lb), SpO2 93 %.Body mass index is 17.12 kg/(m^2).  General Appearance: Bizarre  Eye Contact:  Fair  Speech:  Clear and Coherent  Volume:  Normal  Mood:  Anxious  Affect:  Appropriate  Thought Process:  Goal Directed  Orientation:  Full (Time, Place, and Person)  Thought  Content:  WDL  Suicidal Thoughts:  No  Homicidal Thoughts:  No  Memory:  Immediate;   Fair Recent;   Fair Remote;   Fair  Judgement:  Poor  Insight:  Lacking  Psychomotor Activity:  Normal  Concentration:  Concentration: Fair and Attention Span: Fair  Recall:  [employer] of Knowledge:  Fair  Language:  Fair  Akathisia:  No  Handed:  Right  AIMS (if indicated):     Assets:  Communication Skills Desire for Improvement Financial Resources/Insurance Physical Health Resilience Social Support  ADL's:  Intact  Cognition:  WNL  Sleep:  Number of Hours: 7.75     Treatment Plan Summary: Daily contact with patient to assess and evaluate symptoms and progress in treatment and Medication management   Elizabeth Cherry is a 60 year old female with a history of severe depression admitted for worsening of depression  and self neglect.   1. Recurrent severe major depressive disorder: Patient almost in a catatonic state. She was brought in by social workers from [employer]. The patient only agrees with the starting treatment with Zoloft. Patient tells me she has been on Zoloft in the past and it worked well for her for about 7 years but eventually stopped helping her. Patient is not to augment antidepressant with lithium, Seroquel or Abilify. Despise educating her about the indication and reasoning for an augmenting strategy patient continues to state his medications are supposed to treat schizoaffective disorder and not depression.  -She was started on Zoloft which has been increased to 50 mg po daily for depression and anxiety. Her prior outpatient dosage was 281m daily -She was started on zyprexa which has been increased to 15 mg qhs. As there is definitely some neurosis (very guarded, suspicious). Now voicing suicidality -Patient can become quite uncooperative at times, she complains about all the staff. She is being argumentative or last several days. She's been more some more  thoughts of suicide or making more threats.  2. Anxiety: continue klonopin 0.5 mg tid   3. Insomnia: continue vistaril 50 mg po qhs. We will add Ambien.  4. APS from [place] reported thatHer living conditions were poor/unsanitary. The patient had written things on the walls of the house she is renting. She had multiple boxes and bottles of soda all over the house. Her landlord is planning on evicting her.   5. Self-care deficits/poor oral intake: Self-care has not improved any since admission. Patient's oral intake when down significantly this week and looks like as of yesterday she started eating some again  6. Hypokalemia: completed K Dur 40 mEq for 2 doses  7. UTI: completed 7 days of Macrobid. Patient says she continues to have symptoms ---urine culture again was contaminated. This is the second sample but is contaminated  8. History of gastric ulcers: Patient has been started on Protonix 40 mg a day  9. COPD the patient will be started on albuterol when necessary  10. Tobacco use disorder: Patient declined from using nicotine patch  11. Skin lesions: Patient has multiple plaques on her face. She has history of psoriasis however the plaques do not appear to be psoriasis. Possible seborrheic dermatitis: will start ketoconazole 2 % bid. Derm will see her once discharge appointment has been made  Labs vitamin B12, TSH, lipid panel, hemoglobin A1c and prolactin have been ordered.--- All results have been obtained and now and they are within the normal limits  Diet regular  Hospitalization and status continue involuntary commitment  Disposition and follow-up: Patient is unable to realistically help [place] discharge planning. She is demanding to be discharged back to her apartment and at the same time has been telling APS and our standard once there she will kill herself.  We will support APS in their efforts to move forward with guardianship. Once they become interim guardian  he will be is here for asked to find an work towards a safe discharge plan.  Chest xray: 1. Nodular density overlying the right lung base is probably a nipple shadow. Recommend repeat chest x-ray with nipple markers or follow-up chest x-ray in 3 months to ensure stability/resolution. Lungs otherwise clear. No evidence of pneumonia. 2. Lungs at least mildly hyperexpanded suggesting COPD. 3. No acute findings.  Social issues:  Per sEducation officer, museum Patient was interviewed by different staff members from group homes. Only one staff member from a transitional house  in [place] is willing to accept the patient upon discharge. As patient only has Medicare many facilities are reluctant to take her because there is no guarantee of payment.  [facility] social worker reports that the patient will be evicted from her apartment.  Met with APS on 6/15: Patient told APS social worker that she will commit suicide upon discharge. Patient was included in the meeting. Patient refuses to go the transitional house our social workerfound for her. Patient is demanding to be discharged back to her apartment. This Probation officer, the Education officer, museum from [place], and the Education officer, museum here all feel that the patient will be very likely to attempt suicide once discharged to the apartment. Patient is aware that she will be evicted. Patient has no support in the community. The mother who was helping her with her rent has been unreachable for more than a week. Patient has prior history of at least 3 serious suicidal attempts by overdose. We feel that this patient is at high acute risk for suicide. We feared that if this patient is not discharge to a supervised facility she will attempt suicide. Based on our meeting yesterday we have decided to apply for interim guardianship. This will allow APS to become guardian for 30 days. Letter supporting guardianship has been faxed to APS  I certify that the services received since the previous  certification/recertification were and continue to be medically necessary as the treatment provided can be reasonably expected to improve the patient's condition; the medical record documents that the services furnished were intensive treatment services or their equivalent services, and this patient continues to need, on a daily basis, active treatment furnished directly by or requiring the supervision of inpatient psychiatric personnel.  Orson Slick, MD 11/06/2015, 12:01 PM

## 2015-11-07 MED ORDER — PROMETHAZINE HCL 25 MG PO TABS
25.0000 mg | ORAL_TABLET | Freq: Once | ORAL | Status: AC
  Administered 2015-11-07: 25 mg via ORAL

## 2015-11-07 MED ORDER — QUETIAPINE FUMARATE 25 MG PO TABS
50.0000 mg | ORAL_TABLET | Freq: Every day | ORAL | Status: DC
  Administered 2015-11-07 – 2015-11-09 (×2): 50 mg via ORAL
  Filled 2015-11-07 (×3): qty 2

## 2015-11-07 NOTE — Progress Notes (Signed)
Patient mood improved and affect slightly brighter. No SI/HI at this time. Patient remains with poor adls, refusing shower. Patient refuses am meds and then takes requests am meds. Patient showing interest in discharge planning. Talks with social worker rt plan. Safety maintained.

## 2015-11-07 NOTE — Plan of Care (Signed)
Problem: Coping: Goal: Ability to cope will improve Outcome: Progressing Patient verbalizes ability to cope as per stated coping mechanisms [place] RN

## 2015-11-07 NOTE — BHH Group Notes (Signed)
[facility] Group Notes:  (Nursing/MHT/Case Management/Adjunct)  Date:  11/07/2015  Time:  5:54 PM  Type of Therapy:  Psychoeducational Skills  Participation Level:  Active  Participation Quality:  Appropriate, Attentive and Sharing  Affect:  Appropriate  Cognitive:  Alert and Appropriate  Insight:  Appropriate  Engagement in Group:  Engaged  Modes of Intervention:  Discussion, Education and Support  Summary of Progress/Problems:  Adela Lank [facility] 11/07/2015, 5:54 PM

## 2015-11-07 NOTE — BHH Group Notes (Signed)
[facility] Group Notes:  (Nursing/MHT/Case Management/Adjunct)  Date:  11/07/2015  Time:  9:54 PM  Type of Therapy:  Group Therapy  Participation Level:  Active  Participation Quality:  Appropriate  Affect:  Appropriate  Cognitive:  Appropriate  Insight:  Appropriate  Engagement in Group:  Engaged  Modes of Intervention:  Discussion  Summary of Progress/Problems:  Elizabeth Cherry 11/07/2015, 9:54 PM

## 2015-11-07 NOTE — Progress Notes (Signed)
Patient ID: Elizabeth Cherry, female   DOB: Oct 16, 1955, 60 y.o.   MRN: [id number]  CSW spoke with DSS caseworker Thressa Sheller at [phone]. He is recommending guardianship and is presenting the case to his supervisor today. He is requesting guardianship within 30 days. Pt is endorsing SI and not taking care of her hygiene.   [place] MSW, [place]  11/07/2015 10:11 AM

## 2015-11-07 NOTE — Progress Notes (Signed)
A&Ox3, c/o Fibromyalgia pain "I don't take anything for it.." Psoriasis with brown raised plaques noted over skin. Irritable, angry "Some psychotic girl just moved in next to me, I want her out of there .Marland Kitchen.." Patient refocused on her own behaviors and encouraged to allow staffs to manage other patients. Clonazepam 0.5 mg PRN was given for anxiety.

## 2015-11-07 NOTE — BHH Group Notes (Signed)
[facility] LCSW Aftercare Discharge Planning Group Note  11/07/2015 11:03 AM  Participation Quality:  DID not attend  Affect:    Cognitive:    Insight:    Engagement in Group:    Modes of Intervention:    Summary of Progress/Problems:  Elizabeth Cherry 11/07/2015, 11:03 AM

## 2015-11-07 NOTE — BHH Group Notes (Signed)
[facility] LCSW Group Therapy  11/07/2015 2:03 PM  Type of Therapy:  Group Therapy  Participation Level:  Active  Participation Quality:  Attentive  Affect:  Defensive  Cognitive:  Appropriate  Insight:  Developing/Improving  Engagement in Therapy:  Developing/Improving  Modes of Intervention:  Discussion, Education, Exploration and Orientation  Summary of Progress/Problems:LCSW introduced self and encouraged group members to introduce themselves reviewed group rules. Today's group was on " My diagnosis" and patients encouraged to understand their symptoms, risk factors, strengths and what to do when patients were experiencing symptoms of their diagnosis. Her per group group was able to relate and support patient today. Peers suggested good ideas to assist her when they are feeling sad,depressed or suicidal. Ideas shared encompassed talking to doctor, staying on medications, get a therapist, attend groups increase exercise and reduce stress and anxiety. This patient was very kind, compassionate to her peers and she was able to reflect openly to peers. Patient presented well today and was able to stay focused and on topic.  Pauletta Browns, Amyra Vantuyl M 11/07/2015, 2:03 PM

## 2015-11-07 NOTE — Progress Notes (Signed)
D: Patient is alert and oriented on the unit this shift. Patient attended and actively participated in groups today. Patient denies suicidal ideation, homicidal ideation, auditory or visual hallucinations at the present time.  A: Scheduled medications are administered to patient as per MD orders. Emotional support and encouragement are provided. Patient is maintained on q.15 minute safety checks. Patient is informed to notify staff with questions or concerns. R: No adverse medication reactions are noted. Patient is cooperative with medication administration,Patient is  assertive,anxiety is 10/10 ,depression 6/10 and cooperative with treatment plan today. Patient is  Anxious but  cooperative on the unit at this time. Patient interacts   with others on the unit this shift. Patient contracts for safety at this time. Patient remains safe at this time.

## 2015-11-07 NOTE — Progress Notes (Signed)
[facility] MD Progress Note  11/07/2015 3:51 PM Elizabeth Cherry  MRN:  [id number]  Subjective:  Elizabeth Cherry continues to improve. She is completely aware of her surroundings and her situation. She refuses placement but found a friend who could possibly help her with housing. She has known this person for 10 years since her days in [place]. The patient is under consideration for a guardianship and DSS caseworker. We do not believe that the patient necessary has to be in a hospital while awaiting guardianship hearing as long as we find her a safe place. She adamantly refuses placement in a group home and threatens suicide if forced to do so. She complains of very poor sleep in spite of Ambien last night. She accepts medications and tolerates them well. There is good group participation. The patient is asking to have her head shaved as her hair is dirty and matted. We are not sure if this can be done in the hospital.  Principal Problem: Severe recurrent major depression without psychotic features [facility]) Diagnosis:   Patient Active Problem List   Diagnosis Date Noted  . Tobacco use disorder [F17.200] 10/24/2015  . Benign neoplasm of colon [D12.6] 10/24/2015  . Alcohol use disorder, severe, in sustained remission ([place]) [F10.21] 10/24/2015  . Borderline personality disorder [F60.3] 10/24/2015  . Severe recurrent major depression without psychotic features ([place]) [F33.2] 10/23/2015  . Hypertension [I10] 10/23/2015  . Self neglect [R46.89] 10/23/2015  . Psoriasis [L40.9] 02/11/2012  . Chronic obstructive pulmonary disease ([place]) [J44.9] 11/15/2011  . Fibromyalgia [M79.7] 11/15/2011  . Chronic pain [G89.29] 10/24/2011   Total Time spent with patient: 20 minutes  Past Psychiatric History: Depression.  Past Medical History:  Past Medical History  Diagnosis Date  . Hypertension   . Depression   . Multiple gastric ulcers    History reviewed. No pertinent past surgical history. Family History: History reviewed.  No pertinent family history. Family Psychiatric  History: See H&P. Social History:  History  Alcohol Use No     History  Drug Use No    Social History   Social History  . Marital Status: Single    Spouse Name: N/A  . Number of Children: N/A  . Years of Education: N/A   Social History Main Topics  . Smoking status: Current Every Day Smoker -- 1.00 packs/day    Types: Cigarettes  . Smokeless tobacco: Never Used  . Alcohol Use: No  . Drug Use: No  . Sexual Activity: Not Asked   Other Topics Concern  . None   Social History Narrative   Additional Social History:                         Sleep: Poor  Appetite:  Poor  Current Medications: Current Facility-Administered Medications  Medication Dose Route Frequency Provider Last Rate Last Dose  . acetaminophen (TYLENOL) tablet 650 mg  650 mg Oral Q6H PRN Gonzella Lex, MD      . albuterol (PROVENTIL HFA;VENTOLIN HFA) 108 (90 Base) MCG/ACT inhaler 1-2 puff  1-2 puff Inhalation Q6H PRN Hildred Priest, MD   2 puff at 11/04/15 1327  . alum & mag hydroxide-simeth (MAALOX/MYLANTA) 200-200-20 MG/5ML suspension 30 mL  30 mL Oral Q4H PRN Gonzella Lex, MD      . clonazePAM Bobbye [place]) tablet 0.5 mg  0.5 mg Oral TID PRN Hildred Priest, MD   0.5 mg at 11/07/15 1310  . docusate sodium (COLACE) capsule 200 mg  200 mg  Oral BID Hildred Priest, MD   200 mg at 11/07/15 1306  . feeding supplement (ENSURE ENLIVE) (ENSURE ENLIVE) liquid 237 mL  237 mL Oral TID BM Hildred Priest, MD   237 mL at 11/07/15 1000  . hydrOXYzine (ATARAX/VISTARIL) tablet 50 mg  50 mg Oral QHS Hildred Priest, MD   50 mg at 11/06/15 2202  . ketoconazole (NIZORAL) 2 % cream   Topical BID Hildred Priest, MD      . magnesium hydroxide (MILK OF MAGNESIA) suspension 30 mL  30 mL Oral Daily PRN Gonzella Lex, MD      . OLANZapine (ZYPREXA) tablet 15 mg  15 mg Oral QHS Hildred Priest, MD   15  mg at 11/06/15 2202  . pantoprazole (PROTONIX) EC tablet 40 mg  40 mg Oral Daily Hildred Priest, MD   40 mg at 11/07/15 1307  . polyethylene glycol (MIRALAX / GLYCOLAX) packet 17 g  17 g Oral Daily PRN Hildred Priest, MD      . promethazine (PHENERGAN) tablet 12.5 mg  12.5 mg Oral Q6H PRN Hildred Priest, MD   12.5 mg at 11/05/15 0931  . sertraline (ZOLOFT) tablet 50 mg  50 mg Oral Q breakfast Chauncey Mann, MD   50 mg at 11/07/15 1307  . zolpidem (AMBIEN) tablet 5 mg  5 mg Oral QHS Clovis Fredrickson, MD   5 mg at 11/06/15 2120    Lab Results: No results found for this or any previous visit (from the past 48 hour(s)).  Blood Alcohol level:  Lab Results  Component Value Date   ETH <5 10/23/2015    Physical Findings: AIMS: Facial and Oral Movements Muscles of Facial Expression: None, normal Lips and Perioral Area: None, normal Jaw: None, normal Tongue: None, normal,Extremity Movements Upper (arms, wrists, hands, fingers): None, normal Lower (legs, knees, ankles, toes): None, normal, Trunk Movements Neck, shoulders, hips: None, normal, Overall Severity Severity of abnormal movements (highest score from questions above): None, normal Incapacitation due to abnormal movements: None, normal Patient's awareness of abnormal movements (rate only patient's report): No Awareness, Dental Status Current problems with teeth and/or dentures?: No Does patient usually wear dentures?: No  CIWA:  CIWA-Ar Total: 0 COWS:  COWS Total Score: 0  Musculoskeletal: Strength & Muscle Tone: within normal limits Gait & Station: normal Patient leans: N/A  Psychiatric Specialty Exam: Physical Exam  Nursing note and vitals reviewed.   Review of Systems  Psychiatric/Behavioral: Positive for depression and suicidal ideas.  All other systems reviewed and are negative.   Blood pressure 113/74, pulse 87, temperature 98.1 F (36.7 C), temperature source Oral, resp. rate 18,  height '5\' 6"'  (1.676 m), weight 48.081 kg (106 lb), SpO2 93 %.Body mass index is 17.12 kg/(m^2).  General Appearance: Disheveled  Eye Contact:  Good  Speech:  Clear and Coherent  Volume:  Normal  Mood:  Anxious  Affect:  Appropriate  Thought Process:  Goal Directed  Orientation:  Full (Time, Place, and Person)  Thought Content:  WDL  Suicidal Thoughts:  Yes.  with intent/plan  Homicidal Thoughts:  No  Memory:  Immediate;   Fair Recent;   Fair Remote;   Fair  Judgement:  Poor  Insight:  Shallow  Psychomotor Activity:  Normal  Concentration:  Concentration: Fair and Attention Span: Fair  Recall:  [employer] of Knowledge:  Fair  Language:  Fair  Akathisia:  No  Handed:  Right  AIMS (if indicated):     Assets:  Communication  Skills Desire for Improvement Financial Resources/Insurance Physical Health Resilience Social Support  ADL's:  Intact  Cognition:  WNL  Sleep:  Number of Hours: 7.75     Treatment Plan Summary: Daily contact with patient to assess and evaluate symptoms and progress in treatment and Medication management   Ms. Argote is a 60 year old female with a history of severe depression admitted for worsening of depression and self neglect.   1. Recurrent severe major depressive disorder: Patient almost in a catatonic state. She was brought in by social workers from [employer]. The patient only agrees with the starting treatment with Zoloft. Patient tells me she has been on Zoloft in the past and it worked well for her for about 7 years but eventually stopped helping her. Patient is not to augment antidepressant with lithium, Seroquel or Abilify. Despise educating her about the indication and reasoning for an augmenting strategy patient continues to state his medications are supposed to treat schizoaffective disorder and not depression.  -She was started on Zoloft which has been increased to 50 mg po daily for depression and anxiety. Her prior  outpatient dosage was 226m daily -She was started on zyprexa which has been increased to 15 mg qhs. As there is definitely some neurosis (very guarded, suspicious). Now voicing suicidality -Patient can become quite uncooperative at times, she complains about all the staff. She is being argumentative or last several days. She's been more some more thoughts of suicide or making more threats.  2. Anxiety: continue klonopin 0.5 mg tid   3. Insomnia: continue vistaril 50 mg po qhs. We will discontinue Ambien and start Seroquel..  4. APS from [place] reported thatHer living conditions were poor/unsanitary. The patient had written things on the walls of the house she is renting. She had multiple boxes and bottles of soda all over the house. Her landlord is planning on evicting her.   5. Self-care deficits/poor oral intake: Self-care has not improved any since admission. Patient's oral intake when down significantly this week and looks like as of yesterday she started eating some again  6. Hypokalemia: completed K Dur 40 mEq for 2 doses  7. UTI: completed 7 days of Macrobid. Patient says she continues to have symptoms ---urine culture again was contaminated. This is the second sample but is contaminated  8. History of gastric ulcers: Patient has been started on Protonix 40 mg a day  9. COPD the patient will be started on albuterol when necessary  10. Tobacco use disorder: Patient declined from using nicotine patch  11. Skin lesions: Patient has multiple plaques on her face. She has history of psoriasis however the plaques do not appear to be psoriasis. Possible seborrheic dermatitis: will start ketoconazole 2 % bid. Derm will see her once discharge appointment has been made  Labs vitamin B12, TSH, lipid panel, hemoglobin A1c and prolactin have been ordered.--- All results have been obtained and now and they are within the normal limits  Diet regular  Hospitalization and status continue  involuntary commitment  Disposition and follow-up: Patient is unable to realistically [place]. She is demanding to be discharged back to her apartment and at the same time has been telling APS and our standard once there she will kill herself.  We will support APS in their efforts to move forward with guardianship. Once they become interim guardian he will be is here for asked to find an work towards a safe discharge plan.  Chest xray: 1. Nodular density  overlying the right lung base is probably a nipple shadow. Recommend repeat chest x-ray with nipple markers or follow-up chest x-ray in 3 months to ensure stability/resolution. Lungs otherwise clear. No evidence of pneumonia. 2. Lungs at least mildly hyperexpanded suggesting COPD. 3. No acute findings.  Social issues:  Per Education officer, museum: Patient was interviewed by different staff members from group homes. Only one staff member from a transitional house in [place] is willing to accept the patient upon discharge. As patient only has Medicare many facilities are reluctant to take her because there is no guarantee of payment.  [facility] social worker reports that the patient will be evicted from her apartment.  Met with APS on 6/15: Patient told APS social worker that she will commit suicide upon discharge. Patient was included in the meeting. Patient refuses to go the transitional house our social workerfound for her. Patient is demanding to be discharged back to her apartment. This Probation officer, the Education officer, museum from [place], and the Education officer, museum here all feel that the patient will be very likely to attempt suicide once discharged to the apartment. Patient is aware that she will be evicted. Patient has no support in the community. The mother who was helping her with her rent has been unreachable for more than a week. Patient has prior history of at least 3 serious suicidal attempts by overdose. We feel that this patient is at  high acute risk for suicide. We feared that if this patient is not discharge to a supervised facility she will attempt suicide. Based on our meeting yesterday we have decided to apply for interim guardianship. This will allow APS to become guardian for 30 days. Letter supporting guardianship has been faxed to APS  Orson Slick, MD 11/07/2015, 3:51 PM

## 2015-11-07 NOTE — Progress Notes (Signed)
Recreation Therapy Notes  Date: 6.20.17 Time: 9:30 am Location: Craft Room  Group Topic: Goal Setting  Goal Area(s) Addresses:  Patient will identify one goal. Patient will identify at least one supportive statement. Patient will verbalize benefit of setting goals.  Behavioral Response: Did not attend  Intervention: Step By Step  Activity: Patients were given a foot worksheet and instructed to write their goal inside the food. On the outside of the foot, patients were instructed to write supportive statements.  Education: LRT educated patients on healthy ways they can celebrate reaching their goals.   Education Outcome: Patient did not attend group.  Clinical Observations/Feedback: Patient did not attend group.  Leonette Monarch, LRT/CTRS 11/07/2015 10:11 AM

## 2015-11-08 MED ORDER — SERTRALINE HCL 100 MG PO TABS
100.0000 mg | ORAL_TABLET | Freq: Every day | ORAL | Status: DC
  Administered 2015-11-09 – 2015-11-10 (×4): 100 mg via ORAL
  Filled 2015-11-08 (×2): qty 1

## 2015-11-08 MED ORDER — CLONAZEPAM 0.5 MG PO TABS
0.2500 mg | ORAL_TABLET | Freq: Three times a day (TID) | ORAL | Status: DC | PRN
  Administered 2015-11-08 – 2015-11-09 (×2): 0.25 mg via ORAL
  Filled 2015-11-08 (×3): qty 1

## 2015-11-08 NOTE — Progress Notes (Signed)
Recreation Therapy Notes  Date: 06.21.17 Time: 9:30 am Location: Craft Room  Group Topic: Self-esteem  Goal Area(s) Addresses:  Patient will be able to identify at least one positive trait. Patient will verbalize benefit of having healthy self-esteem.  Behavioral Response: Did not attend  Intervention: I Am  Activity: Patients were given a worksheet with the letter I on it and instructed to write as many positive traits about themselves inside the letter.  Education: LRT educated patients on ways they can increase their self-esteem.  Education Outcome: Patient did not attend group.  Clinical Observations/Feedback: Patient did not attend group.  Leonette Monarch, LRT/CTRS 11/08/2015 10:10 AM

## 2015-11-08 NOTE — Plan of Care (Signed)
Problem: Coping: Goal: Ability to verbalize frustrations and anger appropriately will improve Outcome: Progressing Patient able to verbalize frustrations and anger appropriately by talking to the nursing staffs without confrontation with peers

## 2015-11-08 NOTE — Progress Notes (Signed)
Pt isolated in room for much of the day, pt remains irritable with staff, requires frequent redirection and limit setting, pt frequently staff splits, this am patient did not get up for breakfast then at lunch requested a cheeseburger stating that she could not eat the fruit plate that she ordered on her tray because she was nauseated, however patient ate the fruit plate without issue, patient refused to eat dinner tray because she received a house tray due to the fact that she ordered pizza and that is not an offered option, Agricultural consultant and AD present at this time, re-educated patient on meal ordering and how to fill out menu, ordered patient a dinner tray and set limits, patient verbalized understanding, denies SI/HI/AVH.  A:  Emotional support provided, Encouraged pt to continue with treatment plan and attend all group activities, q15 min checks maintained for safety.  R:  Pt is not receptive, not going to groups,irritable with staff and isolates in room at times.

## 2015-11-08 NOTE — Progress Notes (Signed)
[facility] MD Progress Note  11/08/2015 6:09 AM Malai Lady  MRN:  [id number]  Subjective:  Elizabeth Cherry met with the treatment team today. She complains of stomach pain today but otherwise feels much better. Her depression has much improved. She is not anxious. She participates in programming. She interacts with peers and staff appropriately. Her hygiene is still terrible. The patient claims that she cannot shower because her "hair would not dry". She requested to have her head shaved but we were unable to find a way to do so while in the hospital. The patient is facing eviction from her apartment but did not receive proper notification from her landlord. DSS is involved in her case and guardianship hearing is pending. We encouraged the patient to participate in guardianship hearing to present her case to the judge. It is not clear that the patient needs to stay in the hospital until then. Social worker is intact with her friend with whom the patient hopes to stay for now. She accepts medications and tolerates them well. She was given Seroquel last night and reports good sleep. She did not feel rested in the morning however. She threatened suicide if forced to go to a group home.  Principal Problem: Severe recurrent major depression without psychotic features ([place]) Diagnosis:   Patient Active Problem List   Diagnosis Date Noted  . Tobacco use disorder [F17.200] 10/24/2015  . Benign neoplasm of colon [D12.6] 10/24/2015  . Alcohol use disorder, severe, in sustained remission ([place]) [F10.21] 10/24/2015  . Borderline personality disorder [F60.3] 10/24/2015  . Severe recurrent major depression without psychotic features ([place]) [F33.2] 10/23/2015  . Hypertension [I10] 10/23/2015  . Self neglect [R46.89] 10/23/2015  . Psoriasis [L40.9] 02/11/2012  . Chronic obstructive pulmonary disease ([place]) [J44.9] 11/15/2011  . Fibromyalgia [M79.7] 11/15/2011  . Chronic pain [G89.29] 10/24/2011   Total Time spent with  patient: 30 minutes  Past Psychiatric History: Depression.  Past Medical History:  Past Medical History  Diagnosis Date  . Hypertension   . Depression   . Multiple gastric ulcers    History reviewed. No pertinent past surgical history. Family History: History reviewed. No pertinent family history. Family Psychiatric  History: See H&P. Social History:  History  Alcohol Use No     History  Drug Use No    Social History   Social History  . Marital Status: Single    Spouse Name: N/A  . Number of Children: N/A  . Years of Education: N/A   Social History Main Topics  . Smoking status: Current Every Day Smoker -- 1.00 packs/day    Types: Cigarettes  . Smokeless tobacco: Never Used  . Alcohol Use: No  . Drug Use: No  . Sexual Activity: Not Asked   Other Topics Concern  . None   Social History Narrative   Additional Social History:                         Sleep: Fair  Appetite:  Fair  Current Medications: Current Facility-Administered Medications  Medication Dose Route Frequency Provider Last Rate Last Dose  . acetaminophen (TYLENOL) tablet 650 mg  650 mg Oral Q6H PRN Gonzella Lex, MD      . albuterol (PROVENTIL HFA;VENTOLIN HFA) 108 (90 Base) MCG/ACT inhaler 1-2 puff  1-2 puff Inhalation Q6H PRN Hildred Priest, MD   2 puff at 11/04/15 1327  . alum & mag hydroxide-simeth (MAALOX/MYLANTA) 200-200-20 MG/5ML suspension 30 mL  30 mL Oral Q4H  PRN Gonzella Lex, MD      . clonazePAM Bobbye [place]) tablet 0.5 mg  0.5 mg Oral TID PRN Hildred Priest, MD   0.5 mg at 11/07/15 2114  . docusate sodium (COLACE) capsule 200 mg  200 mg Oral BID Hildred Priest, MD   200 mg at 11/07/15 1306  . feeding supplement (ENSURE ENLIVE) (ENSURE ENLIVE) liquid 237 mL  237 mL Oral TID BM Hildred Priest, MD   237 mL at 11/07/15 2000  . hydrOXYzine (ATARAX/VISTARIL) tablet 50 mg  50 mg Oral QHS Hildred Priest, MD   50 mg at 11/07/15  2113  . ketoconazole (NIZORAL) 2 % cream   Topical BID Hildred Priest, MD      . magnesium hydroxide (MILK OF MAGNESIA) suspension 30 mL  30 mL Oral Daily PRN Gonzella Lex, MD      . OLANZapine (ZYPREXA) tablet 15 mg  15 mg Oral QHS Hildred Priest, MD   15 mg at 11/07/15 2113  . pantoprazole (PROTONIX) EC tablet 40 mg  40 mg Oral Daily Hildred Priest, MD   40 mg at 11/07/15 1307  . polyethylene glycol (MIRALAX / GLYCOLAX) packet 17 g  17 g Oral Daily PRN Hildred Priest, MD      . promethazine (PHENERGAN) tablet 12.5 mg  12.5 mg Oral Q6H PRN Hildred Priest, MD   12.5 mg at 11/05/15 0931  . QUEtiapine (SEROQUEL) tablet 50 mg  50 mg Oral QHS Jolanta B Pucilowska, MD   50 mg at 11/07/15 2113  . sertraline (ZOLOFT) tablet 50 mg  50 mg Oral Q breakfast Chauncey Mann, MD   50 mg at 11/07/15 1307    Lab Results: No results found for this or any previous visit (from the past 48 hour(s)).  Blood Alcohol level:  Lab Results  Component Value Date   ETH <5 10/23/2015    Physical Findings: AIMS: Facial and Oral Movements Muscles of Facial Expression: None, normal Lips and Perioral Area: None, normal Jaw: None, normal Tongue: None, normal,Extremity Movements Upper (arms, wrists, hands, fingers): None, normal Lower (legs, knees, ankles, toes): None, normal, Trunk Movements Neck, shoulders, hips: None, normal, Overall Severity Severity of abnormal movements (highest score from questions above): None, normal Incapacitation due to abnormal movements: None, normal Patient's awareness of abnormal movements (rate only patient's report): No Awareness, Dental Status Current problems with teeth and/or dentures?: No Does patient usually wear dentures?: No  CIWA:  CIWA-Ar Total: 0 COWS:  COWS Total Score: 0  Musculoskeletal: Strength & Muscle Tone: within normal limits Gait & Station: normal Patient leans: N/A  Psychiatric Specialty  Exam: Physical Exam  Nursing note and vitals reviewed.   Review of Systems  Psychiatric/Behavioral: Positive for depression and suicidal ideas.  All other systems reviewed and are negative.   Blood pressure 113/74, pulse 87, temperature 98.1 F (36.7 C), temperature source Oral, resp. rate 18, height '5\' 6"'$  (1.676 m), weight 48.081 kg (106 lb), SpO2 93 %.Body mass index is 17.12 kg/(m^2).  General Appearance: Bizarre  Eye Contact:  Good  Speech:  Clear and Coherent  Volume:  Normal  Mood:  Depressed  Affect:  Appropriate  Thought Process:  Goal Directed  Orientation:  Full (Time, Place, and Person)  Thought Content:  WDL  Suicidal Thoughts:  Yes.  with intent/plan  Homicidal Thoughts:  No  Memory:  Immediate;   Fair Recent;   Fair Remote;   Fair  Judgement:  Poor  Insight:  Shallow  Psychomotor Activity:  Normal  Concentration:  Concentration: Fair and Attention Span: Fair  Recall:  [employer] of Knowledge:  Fair  Language:  Fair  Akathisia:  No  Handed:  Right  AIMS (if indicated):     Assets:  Communication Skills Desire for Improvement Financial Resources/Insurance Physical Health Resilience Social Support  ADL's:  Intact  Cognition:  WNL  Sleep:  Number of Hours: 6     Treatment Plan Summary: Daily contact with patient to assess and evaluate symptoms and progress in treatment and Medication management   Ms. Asa is a 60 year old female with a history of severe depression admitted for worsening of depression and self neglect.   1. Suicidal ideation. The patient threatened suicide if we were to discharge her to structure environment of the group home.  2. Mood. She has been treated with a combination of Zoloft and Zyprexa for depression. I will increase Zoloft to 100 mg. a not  3. Anxiety. She's on low-dose clonazepam. I would like to discontinue it as I anticipate she will have difficulties obtaining it from outpatient provider. I will cut her dose in  half.  4. Insomnia. She gets Vistaril at night. It helps her fall asleep but has not help maintaining it. She did not respond to trazodone or Ambien. We started Seroquel with improvement.  5. Social. Her case was referred to DSS and she has assigned DSS worker. Guardianship hearing is pending.  6. Constipation. She is o bowel regimen.  7. GERD.  She is on Protonix. Phenergan is available for nausea.   8. Failure to thrive. She gets ensure will check her weight in the morning.  9. Skin changes. We will continue Nizoral. The patient refuses to shower.  10. UTI. She completed a course of Macrobid.  11. COPD. Albuterol is available.  12. Smoking. The patient declined a nicotine patch.  13. Metabolic syndrome monitoring. Lipid profile, TSH, hemoglobin A1c are normal. PRL 8.5.  14. Disposition. To be established. The patient refuses group home placement. Guardianship hearing pending. The patient is status to stay with her friend. Social worker is trying to arrange for a meeting.  11/08/2015     1. Recurrent severe major depressive disorder: Patient almost in a catatonic state. She was brought in by social workers from [employer]. The patient only agrees with the starting treatment with Zoloft. Patient tells me she has been on Zoloft in the past and it worked well for her for about 7 years but eventually stopped helping her. Patient is not to augment antidepressant with lithium, Seroquel or Abilify. Despise educating her about the indication and reasoning for an augmenting strategy patient continues to state his medications are supposed to treat schizoaffective disorder and not depression.  -She was started on Zoloft which has been increased to 50 mg po daily for depression and anxiety. Her prior outpatient dosage was '200mg'$  daily -She was started on zyprexa which has been increased to 15 mg qhs. As there is definitely some neurosis (very guarded, suspicious). Now voicing  suicidality -Patient can become quite uncooperative at times, she complains about all the staff. She is being argumentative or last several days. She's been more some more thoughts of suicide or making more threats.  2. Anxiety: continue klonopin 0.5 mg tid   3. Insomnia: continue vistaril 50 mg po qhs. We will discontinue Ambien and start Seroquel..  4. APS from [place] reported thatHer living conditions were poor/unsanitary. The patient had written things on the walls of the  house she is renting. She had multiple boxes and bottles of soda all over the house. Her landlord is planning on evicting her.   5. Self-care deficits/poor oral intake: Self-care has not improved any since admission. Patient's oral intake when down significantly this week and looks like as of yesterday she started eating some again  6. Hypokalemia: completed K Dur 40 mEq for 2 doses  7. UTI: completed 7 days of Macrobid. Patient says she continues to have symptoms ---urine culture again was contaminated. This is the second sample but is contaminated  8. History of gastric ulcers: Patient has been started on Protonix 40 mg a day  9. COPD the patient will be started on albuterol when necessary  10. Tobacco use disorder: Patient declined from using nicotine patch  11. Skin lesions: Patient has multiple plaques on her face. She has history of psoriasis however the plaques do not appear to be psoriasis. Possible seborrheic dermatitis: will start ketoconazole 2 % bid. Derm will see her once discharge appointment has been made  Labs vitamin B12, TSH, lipid panel, hemoglobin A1c and prolactin have been ordered.--- All results have been obtained and now and they are within the normal limits  Diet regular  Hospitalization and status continue involuntary commitment  Disposition and follow-up: Patient is unable to realistically help [place] with discharge planning. She is demanding to be discharged back to her apartment and  at the same time has been telling APS and our standard once there she will kill herself.  We will support APS in their efforts to move forward with guardianship. Once they become interim guardian he will be is here for asked to find an work towards a safe discharge plan.  Chest xray: 1. Nodular density overlying the right lung base is probably a nipple shadow. Recommend repeat chest x-ray with nipple markers or follow-up chest x-ray in 3 months to ensure stability/resolution. Lungs otherwise clear. No evidence of pneumonia. 2. Lungs at least mildly hyperexpanded suggesting COPD. 3. No acute findings.  Social issues:  Per Child psychotherapist: Patient was interviewed by different staff members from group homes. Only one staff member from a transitional house in [place] is willing to accept the patient upon discharge. As patient only has Medicare many facilities are reluctant to take her because there is no guarantee of payment.  [facility] social worker reports that the patient will be evicted from her apartment.  Met with APS on 6/15: Patient told APS social worker that she will commit suicide upon discharge. Patient was included in the meeting. Patient refuses to go the transitional house our social workerfound for her. Patient is demanding to be discharged back to her apartment. This Clinical research associate, the Child psychotherapist from APS, and the Child psychotherapist here all feel that the patient will be very likely to attempt suicide once discharged to the apartment. Patient is aware that she will be evicted. Patient has no support in the community. The mother who was helping her with her rent has been unreachable for more than a week. Patient has prior history of at least 3 serious suicidal attempts by overdose. We feel that this patient is at high acute risk for suicide. We feared that if this patient is not discharge to a supervised facility she will attempt suicide. Based on our meeting yesterday we have decided to apply  for interim guardianship. This will allow APS to become guardian for 30 days. Letter supporting guardianship has been faxed to APS  Kristine Linea, MD 11/08/2015, 6:09  AM

## 2015-11-08 NOTE — BHH Group Notes (Signed)
[facility] Group Notes:  (Nursing/MHT/Case Management/Adjunct)  Date:  11/08/2015  Time:  6:48 PM  Type of Therapy:  Psychoeducational Skills  Participation Level:  None  Participation Quality:  Attentive  Affect:  Flat  Cognitive:  Appropriate  Insight:  Appropriate  Engagement in Group:  None  Modes of Intervention:  Discussion and Education  Summary of Progress/Problems: Patient stayed in group the entire time and sat quietly.    Charise Killian 11/08/2015, 6:48 PM

## 2015-11-08 NOTE — Progress Notes (Signed)
Unusual, refused bedtime medications, withdrawn to room, no interactions with anyone, A&Ox3, denied SI/SIB, denied AV/H. Will continue to monitor for safety.

## 2015-11-08 NOTE — Tx Team (Signed)
Interdisciplinary Treatment Plan Update (Adult)  Date:  11/08/2015 Time Reviewed:  2:56 PM  Progress in Treatment: Attending groups: Yes. Participating in groups:  Yes. Taking medication as prescribed:  Yes. Tolerating medication:  Yes. Family/Significant othe contact made:  Yes, individual(s) contacted:  friend  Patient understands diagnosis:  No. and As evidenced by:  limited insight  Discussing patient identified problems/goals with staff:  Yes. Medical problems stabilized or resolved:  No. Denies suicidal/homicidal ideation: Yes. Issues/concerns per patient self-inventory:  Yes. Other:  New problem(s) identified: No, Describe:  NA  Discharge Plan or Barriers:  Reason for Continuation of Hospitalization: Anxiety Depression Medication stabilization Suicidal ideation  Comments:Elizabeth Cherry met with the treatment team today. She complains of stomach pain today but otherwise feels much better. Her depression has much improved. She is not anxious. She participates in programming. She interacts with peers and staff appropriately. Her hygiene is still terrible. The patient claims that she cannot shower because her "hair would not dry". She requested to have her head shaved but we were unable to find a way to do so while in the hospital. The patient is facing eviction from her apartment but did not receive proper notification from her landlord. DSS is involved in her case and guardianship hearing is pending. We encouraged the patient to participate in guardianship hearing to present her case to the judge. It is not clear that the patient needs to stay in the hospital until then. Social worker is intact with her friend with whom the patient hopes to stay for now. She accepts medications and tolerates them well. She was given Seroquel last night and reports good sleep. She did not feel rested in the morning however. She threatened suicide if forced to go to a group home.  Estimated length of  stay: 3-5 days   New goal(s): NA  Review of initial/current patient goals per problem list:   1.  Goal(s): Patient will participate in aftercare plan * Met:  * Target date: at discharge * As evidenced by: Patient will participate within aftercare plan AEB aftercare provider and housing plan at discharge being identified.   2.  Goal (s): Patient will exhibit decreased depressive symptoms and suicidal ideations. * Met:  *  Target date: at discharge * As evidenced by: Patient will utilize self rating of depression at 3 or below and demonstrate decreased signs of depression or be deemed stable for discharge by MD.   3.  Goal(s): Patient will demonstrate decreased signs and symptoms of anxiety. * Met:  * Target date: at discharge * As evidenced by: Patient will utilize self rating of anxiety at 3 or below and demonstrated decreased signs of anxiety, or be deemed stable for discharge by MD    Attendees: Patient:  Elizabeth Cherry 6/21/20172:56 PM  Family:   6/21/20172:56 PM  Physician:   Dr. Bary Leriche  6/21/20172:56 PM  Nursing:   Floyde Parkins, RN 6/21/20172:56 PM  Case Manager:   6/21/20172:56 PM  Counselor:   6/21/20172:56 PM  Other:  Wray Kearns, LCSWA 6/21/20172:56 PM  Other:  Everitt Amber, [place]  6/21/20172:56 PM  Other:   6/21/20172:56 PM  Other:  6/21/20172:56 PM  Other:  6/21/20172:56 PM  Other:  6/21/20172:56 PM  Other:  6/21/20172:56 PM  Other:  6/21/20172:56 PM  Other:  6/21/20172:56 PM  Other:   6/21/20172:56 PM   Scribe for Treatment Team:   Wray Kearns MSW, [place] , 11/08/2015, 2:56 PM

## 2015-11-09 MED ORDER — OLANZAPINE 15 MG PO TABS: 15.0000 mg | ORAL_TABLET | Freq: Every day | ORAL | Status: DC

## 2015-11-09 MED ORDER — DOCUSATE SODIUM 100 MG PO CAPS: 200.0000 mg | ORAL_CAPSULE | Freq: Two times a day (BID) | ORAL | Status: DC

## 2015-11-09 MED ORDER — PANTOPRAZOLE SODIUM 40 MG PO TBEC: 40.0000 mg | DELAYED_RELEASE_TABLET | Freq: Every day | ORAL | Status: DC

## 2015-11-09 MED ORDER — POLYETHYLENE GLYCOL 3350 17 G PO PACK: 17.0000 g | PACK | Freq: Every day | ORAL | Status: DC | PRN

## 2015-11-09 MED ORDER — SERTRALINE HCL 100 MG PO TABS: 100.0000 mg | ORAL_TABLET | Freq: Every day | ORAL | Status: DC

## 2015-11-09 MED ORDER — QUETIAPINE FUMARATE 50 MG PO TABS: 50.0000 mg | ORAL_TABLET | Freq: Every day | ORAL | Status: DC

## 2015-11-09 MED ORDER — KETOCONAZOLE 2 % EX CREA: TOPICAL_CREAM | Freq: Two times a day (BID) | CUTANEOUS | Status: DC

## 2015-11-09 MED ORDER — ALBUTEROL SULFATE HFA 108 (90 BASE) MCG/ACT IN AERS: 1.0000 | INHALATION_SPRAY | Freq: Four times a day (QID) | RESPIRATORY_TRACT | Status: DC | PRN

## 2015-11-09 MED ORDER — HYDROXYZINE HCL 50 MG PO TABS: 50.0000 mg | ORAL_TABLET | Freq: Every day | ORAL | Status: DC

## 2015-11-09 MED ORDER — CLONAZEPAM 0.5 MG PO TABS: 0.5000 mg | ORAL_TABLET | Freq: Three times a day (TID) | ORAL | Status: AC | PRN

## 2015-11-09 MED ORDER — CLONAZEPAM 0.5 MG PO TABS
0.5000 mg | ORAL_TABLET | Freq: Three times a day (TID) | ORAL | Status: DC | PRN
  Administered 2015-11-09 – 2015-11-10 (×3): 0.5 mg via ORAL
  Filled 2015-11-09 (×3): qty 1

## 2015-11-09 NOTE — Progress Notes (Signed)
Recreation Therapy Notes  Date: 06.22.17 Time: 9:30 am Location: Craft Room  Group Topic: Leisure Education  Goal Area(s) Addresses:  Patient will identify things they are grateful for. Patient will be educated on why it is important to be grateful.  Behavioral Response: Intermittently Attentive, Interactive, Off topic  Intervention: Grateful Wheel  Activity: Patients were given an I Am Grateful For worksheet and instructed to write things they were grateful for under each category.  Education: LRT educated patients on why it is important to be grateful.  Education Outcome: Acknowledges education/In group clarification offered  Clinical Observations/Feedback: Patient kept asking about the markers and why the patients cannot have the markers in the day room. LRT informed patient it was a rule that the patients had to be watched when using the markers. Patient kept asking why it was a rule. LRT informed patient it was a rule that we had to follow. Patient wrote down things she was grateful for. Patient talked to nursing students. Patient contributed to group discussion by stating what she is grateful for and why it is important to be grateful.  Leonette Monarch, LRT/CTRS 11/09/2015 10:22 AM

## 2015-11-09 NOTE — BHH Group Notes (Signed)
[facility] Group Notes:  (Nursing/MHT/Case Management/Adjunct)  Date:  11/09/2015  Time:  3:54 PM  Type of Therapy:  Psychoeducational Skills  Participation Level:  Minimal  Participation Quality:  Attentive  Affect:  Flat  Cognitive:  Alert  Insight:  Limited  Engagement in Group:  Limited  Modes of Intervention:  Activity, Discussion, Education and Socialization  Summary of Progress/Problems:  Elizabeth Cherry 11/09/2015, 3:54 PM

## 2015-11-09 NOTE — BHH Group Notes (Signed)
[place] LCSW Group Therapy   11/09/2015 1pm   Type of Therapy: Group Therapy   Participation Level: Active   Participation Quality: Attentive, Sharing and Supportive   Affect: Appropriate   Cognitive: Alert and Oriented   Insight: Developing/Improving and Engaged   Engagement in Therapy: Developing/Improving and Engaged   Modes of Intervention: Clarification, Confrontation, Discussion, Education, Exploration, Limit-setting, Orientation, Problem-solving, Rapport Building, Art therapist, Socialization and Support   Summary of Progress/Problems: The topic for group was balance in life. Today's group focused on defining balance in one's own words, identifying things that can knock one off balance, and exploring healthy ways to maintain balance in life. Group members were asked to provide an example of a time when they felt off balance, describe how they handled that situation, and process healthier ways to regain balance in the future. Group members were asked to share the most important tool for maintaining balance that they learned while at [facility] and how they plan to apply this method after discharge. Pt shared with other group members that the pt has become discouraged at the prospect of never regaining the pt's balance.  Pt shared that for the pt feeling off-balance means making feeling nervous, anxious and fearful.  Pt shared that the pt would take the pt's meds, consider doing therapy and that this would assist the pt in regaining the pt's balance, but then the pt would feel better and rethink participating in therapy.  Pt was polite and cooperative with the CSW and other group members and focused and attentive to the topics discussed and the sharing of others.  Pt shared that the pt once felt as if the pt were feeling better and then stopped taking the pt's medications which led to self-harm for the pt.  Pt shared when the pt began taking the pt's medications again, the pt began to regain the pt's  balance. Pt shared the pt intended to keep taking the pt's meds and begin therapy in the future and thus, avoid future hospitalizations.

## 2015-11-09 NOTE — Progress Notes (Signed)
[facility] MD Progress Note  11/09/2015 12:35 PM Elizabeth Cherry  MRN:  [id number]  Subjective:  Elizabeth Cherry is rather upbeat today. She is on the phone with her friend who reportedly will provide housing. Our social worker was unable to talk to this friend today as of yet to confirm it. The patient denies any symptoms of depression or psychosis. She is somewhat anxious and has been taking as needed Ativan. There are no somatic complaints today. There is good group participation. Her hygiene is still terrible she has not taken a shower since admission and reportedly for 2 years prior.  Principal Problem: Severe recurrent major depression without psychotic features ([place]) Diagnosis:   Patient Active Problem List   Diagnosis Date Noted  . Tobacco use disorder [F17.200] 10/24/2015  . Benign neoplasm of colon [D12.6] 10/24/2015  . Alcohol use disorder, severe, in sustained remission ([place]) [F10.21] 10/24/2015  . Borderline personality disorder [F60.3] 10/24/2015  . Severe recurrent major depression without psychotic features ([place]) [F33.2] 10/23/2015  . Hypertension [I10] 10/23/2015  . Self neglect [R46.89] 10/23/2015  . Psoriasis [L40.9] 02/11/2012  . Chronic obstructive pulmonary disease ([place]) [J44.9] 11/15/2011  . Fibromyalgia [M79.7] 11/15/2011  . Chronic pain [G89.29] 10/24/2011   Total Time spent with patient: 20 minutes  Past Psychiatric History: Depression.  Past Medical History:  Past Medical History  Diagnosis Date  . Hypertension   . Depression   . Multiple gastric ulcers    History reviewed. No pertinent past surgical history. Family History: History reviewed. No pertinent family history. Family Psychiatric  History: See H&P. Social History:  History  Alcohol Use No     History  Drug Use No    Social History   Social History  . Marital Status: Single    Spouse Name: N/A  . Number of Children: N/A  . Years of Education: N/A   Social History Main Topics  . Smoking  status: Current Every Day Smoker -- 1.00 packs/day    Types: Cigarettes  . Smokeless tobacco: Never Used  . Alcohol Use: No  . Drug Use: No  . Sexual Activity: Not Asked   Other Topics Concern  . None   Social History Narrative   Additional Social History:                         Sleep: Fair  Appetite:  Fair  Current Medications: Current Facility-Administered Medications  Medication Dose Route Frequency Provider Last Rate Last Dose  . acetaminophen (TYLENOL) tablet 650 mg  650 mg Oral Q6H PRN Gonzella Lex, MD      . albuterol (PROVENTIL HFA;VENTOLIN HFA) 108 (90 Base) MCG/ACT inhaler 1-2 puff  1-2 puff Inhalation Q6H PRN Hildred Priest, MD   2 puff at 11/04/15 1327  . alum & mag hydroxide-simeth (MAALOX/MYLANTA) 200-200-20 MG/5ML suspension 30 mL  30 mL Oral Q4H PRN Gonzella Lex, MD      . clonazePAM (KLONOPIN) tablet 0.25 mg  0.25 mg Oral TID PRN Clovis Fredrickson, MD   0.25 mg at 11/09/15 0852  . docusate sodium (COLACE) capsule 200 mg  200 mg Oral BID Hildred Priest, MD   100 mg at 11/08/15 1128  . feeding supplement (ENSURE ENLIVE) (ENSURE ENLIVE) liquid 237 mL  237 mL Oral TID BM Hildred Priest, MD   237 mL at 11/09/15 1400  . hydrOXYzine (ATARAX/VISTARIL) tablet 50 mg  50 mg Oral QHS Hildred Priest, MD   50 mg at 11/07/15 2113  .  ketoconazole (NIZORAL) 2 % cream   Topical BID Hildred Priest, MD      . magnesium hydroxide (MILK OF MAGNESIA) suspension 30 mL  30 mL Oral Daily PRN Gonzella Lex, MD      . OLANZapine (ZYPREXA) tablet 15 mg  15 mg Oral QHS Hildred Priest, MD   15 mg at 11/07/15 2113  . pantoprazole (PROTONIX) EC tablet 40 mg  40 mg Oral Daily Hildred Priest, MD   40 mg at 11/09/15 0849  . polyethylene glycol (MIRALAX / GLYCOLAX) packet 17 g  17 g Oral Daily PRN Hildred Priest, MD      . promethazine (PHENERGAN) tablet 12.5 mg  12.5 mg Oral Q6H PRN Hildred Priest, MD   12.5 mg at 11/08/15 1238  . QUEtiapine (SEROQUEL) tablet 50 mg  50 mg Oral QHS [place] Nabor B Kaeden Mester, MD   50 mg at 11/07/15 2113  . sertraline (ZOLOFT) tablet 100 mg  100 mg Oral Q breakfast Jrue Yambao B Cletus Paris, MD   100 mg at 11/09/15 [id number]    Lab Results: No results found for this or any previous visit (from the past 48 hour(s)).  Blood Alcohol level:  Lab Results  Component Value Date   ETH <5 10/23/2015    Physical Findings: AIMS: Facial and Oral Movements Muscles of Facial Expression: None, normal Lips and Perioral Area: None, normal Jaw: None, normal Tongue: None, normal,Extremity Movements Upper (arms, wrists, hands, fingers): None, normal Lower (legs, knees, ankles, toes): None, normal, Trunk Movements Neck, shoulders, hips: None, normal, Overall Severity Severity of abnormal movements (highest score from questions above): None, normal Incapacitation due to abnormal movements: None, normal Patient's awareness of abnormal movements (rate only patient's report): No Awareness, Dental Status Current problems with teeth and/or dentures?: No Does patient usually wear dentures?: No  CIWA:  CIWA-Ar Total: 0 COWS:  COWS Total Score: 0  Musculoskeletal: Strength & Muscle Tone: within normal limits Gait & Station: normal Patient leans: N/A  Psychiatric Specialty Exam: Physical Exam  Nursing note and vitals reviewed.   Review of Systems  Psychiatric/Behavioral: The patient is nervous/anxious.   All other systems reviewed and are negative.   Blood pressure 113/74, pulse 87, temperature 98.1 F (36.7 C), temperature source Oral, resp. rate 18, height 5\' 6"  (1.676 m), weight 54.432 kg (120 lb), SpO2 93 %.Body mass index is 19.38 kg/(m^2).  General Appearance: Bizarre and Disheveled  Eye Contact:  Good  Speech:  Clear and Coherent  Volume:  Normal  Mood:  Anxious  Affect:  Appropriate  Thought Process:  Goal Directed  Orientation:  Full (Time,  Place, and Person)  Thought Content:  WDL  Suicidal Thoughts:  No  Homicidal Thoughts:  No  Memory:  Immediate;   Fair Recent;   Fair Remote;   Fair  Judgement:  Poor  Insight:  Lacking  Psychomotor Activity:  Normal  Concentration:  Concentration: Fair and Attention Span: Fair  Recall:  [employer] of Knowledge:  Fair  Language:  Fair  Akathisia:  No  Handed:  Right  AIMS (if indicated):     Assets:  Communication Skills Desire for Improvement Financial Resources/Insurance Physical Health Resilience Social Support  ADL's:  Intact  Cognition:  WNL  Sleep:  Number of Hours: 8.3     Treatment Plan Summary: Daily contact with patient to assess and evaluate symptoms and progress in treatment and Medication management   Elizabeth Cherry is a 60 year old female with a history of severe depression admitted for  worsening of depression and self neglect.   1. Suicidal ideation. The patient threatened suicide if we were to discharge her to structure environment of the group home.  2. Mood. She has been treated with a combination of Zoloft and Zyprexa for depression. I will increase Zoloft to 100 mg. a not  3. Anxiety. She's on low-dose clonazepam. I would like to discontinue it as I anticipate she will have difficulties obtaining it from outpatient provider. I will cut her dose in half.  4. Insomnia. She gets Vistaril at night. It helps her fall asleep but has not help maintaining it. She did not respond to trazodone or Ambien. We started Seroquel with improvement.  5. Social. Her case was referred to DSS and she has assigned DSS worker. Guardianship hearing is pending. We have not been able to contact her DSS worker.  6. Constipation. She is o bowel regimen.  7. GERD. She is on Protonix. Phenergan is available for nausea.   8. Failure to thrive. She gets ensure will check her weight in the morning.  9. Skin changes. We will continue Nizoral. The patient refuses to shower.  10.  UTI. She completed a course of Macrobid.  11. COPD. Albuterol is available.  12. Smoking. The patient declined a nicotine patch.  13. Metabolic syndrome monitoring. Lipid profile, TSH, hemoglobin A1c are normal. PRL 8.5.  14. Disposition. To be established. The patient refuses group home placement. Guardianship hearing pending. The patient wants to stay with her friend. Social worker is trying to arrange for a meeting.  11/09/2015  Orson Slick, MD 11/09/2015, 12:35 PM

## 2015-11-09 NOTE — [facility]HH Suicide Risk Assessment (Signed)
[facility] Discharge Suicide Risk Assessment   Principal Problem: Severe recurrent major depression without psychotic features [facility]) Discharge Diagnoses:  Patient Active Problem List   Diagnosis Date Noted  . Tobacco use disorder [F17.200] 10/24/2015  . Benign neoplasm of colon [D12.6] 10/24/2015  . Alcohol use disorder, severe, in sustained remission ([place]) [F10.21] 10/24/2015  . Borderline personality disorder [F60.3] 10/24/2015  . Severe recurrent major depression without psychotic features ([place]) [F33.2] 10/23/2015  . Hypertension [I10] 10/23/2015  . Self neglect [R46.89] 10/23/2015  . Psoriasis [L40.9] 02/11/2012  . Chronic obstructive pulmonary disease ([place]) [J44.9] 11/15/2011  . Fibromyalgia [M79.7] 11/15/2011  . Chronic pain [G89.29] 10/24/2011    Total Time spent with patient: 30 minutes  Musculoskeletal: Strength & Muscle Tone: within normal limits Gait & Station: normal Patient leans: N/A  Psychiatric Specialty Exam: Review of Systems  Psychiatric/Behavioral: The patient is nervous/anxious.   All other systems reviewed and are negative.   Blood pressure 113/74, pulse 87, temperature 98.1 F (36.7 C), temperature source Oral, resp. rate 18, height 5\' 6"  (1.676 m), weight 54.432 kg (120 lb), SpO2 93 %.Body mass index is 19.38 kg/(m^2).  General Appearance: Bizarre and Disheveled  Eye Contact::  Good  Speech:  Clear and [id number]  Volume:  Normal  Mood:  Anxious  Affect:  Appropriate  Thought Process:  Goal Directed  Orientation:  Full (Time, Place, and Person)  Thought Content:  WDL  Suicidal Thoughts:  No  Homicidal Thoughts:  No  Memory:  Immediate;   Fair Recent;   Fair Remote;   Fair  Judgement:  Impaired  Insight:  Shallow  Psychomotor Activity:  Normal  Concentration:  Fair  Recall:  [place]  Language: Fair  Akathisia:  No  Handed:  Right  AIMS (if indicated):     Assets:  Communication Skills Desire for Improvement Financial  Resources/Insurance Physical Health Resilience Social Support  Sleep:  Number of Hours: 8.3  Cognition: WNL  ADL's:  Intact   Mental Status Per Nursing Assessment::   On Admission:  Suicidal ideation indicated by patient  Demographic Factors:  Caucasian  Loss Factors: Financial problems/change in socioeconomic status  Historical Factors: Impulsivity  Risk Reduction Factors:   Positive social support  Continued Clinical Symptoms:  Depression:   Insomnia  Cognitive Features That Contribute To Risk:  None    Suicide Risk:  Minimal: No identifiable suicidal ideation.  Patients presenting with no risk factors but with morbid ruminations; may be classified as minimal risk based on the severity of the depressive symptoms  Follow-up Information    Schedule an appointment as soon as possible for a visit on 11/15/2015 to follow up.   Why:  Follow up appointment  for skin doctor  at 10:45 AM Please come 15 minutes early    Contact information:   Dr. [facility] [address] [place]      Follow up with [facility].   Why:  If you experience a crisis please call the crisis line at ph: [phone]   Contact information:   [facility] Team [address] [place], [place] [address] Ph: [place]: [phone] Fax: [phone]      Plan Of Care/Follow-up recommendations:  Activity:  as tolerated. Diet:  regular. Other:  keep follow up appintments.  Orson Slick, MD 11/09/2015, 6:17 PM

## 2015-11-09 NOTE — Progress Notes (Signed)
Patient irritable and perseverating on upcoming discharge. Patient with frequent requests from multiple staff that can not be met. Patient manipulative and demanding. Nurse manager in for one on one with little effect. Patient upset doctor is seeing other patients. Patient focused on discharge tomorrow. Safety maintained.  
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
## 2015-11-10 MED ORDER — HYDROXYZINE HCL 50 MG PO TABS: 50.0000 mg | ORAL_TABLET | Freq: Every day | ORAL | Status: DC

## 2015-11-10 MED ORDER — DOCUSATE SODIUM 100 MG PO CAPS: 200.0000 mg | ORAL_CAPSULE | Freq: Two times a day (BID) | ORAL | Status: DC

## 2015-11-10 MED ORDER — PANTOPRAZOLE SODIUM 40 MG PO TBEC: 40.0000 mg | DELAYED_RELEASE_TABLET | Freq: Every day | ORAL | Status: DC

## 2015-11-10 MED ORDER — OLANZAPINE 15 MG PO TABS: 15.0000 mg | ORAL_TABLET | Freq: Every day | ORAL | Status: DC

## 2015-11-10 MED ORDER — QUETIAPINE FUMARATE 50 MG PO TABS: 50.0000 mg | ORAL_TABLET | Freq: Every day | ORAL | Status: DC

## 2015-11-10 MED ORDER — SERTRALINE HCL 100 MG PO TABS: 100.0000 mg | ORAL_TABLET | Freq: Every day | ORAL | Status: DC

## 2015-11-10 NOTE — [facility] Group Notes (Signed)
[place] Group Notes:  (Nursing/MHT/Case Management/Adjunct)  Date:  11/10/2015  Time:  4:08 AM  Type of Therapy:  Group Therapy  Participation Level:  Active  Participation Quality:  Appropriate and Attentive  Affect:  Appropriate  Cognitive:  Alert, Appropriate and Oriented  Insight:  Good  Engagement in Group:  Engaged  Modes of Intervention:  Discussion  Summary of Progress/Problems:  Reyaan Thoma Shyler Analeise Mccleery 11/10/2015, 4:08 AM

## 2015-11-10 NOTE — Progress Notes (Signed)
Patient ID: [place], female   DOB: 10/19/1955, 60 y.o.   MRN: [id number]  CSW attempted to call [facility], APS at [phone] to inform him of patients's discharge. CSW left messages on 11/09/15 and 11/10/15 requesting call back. Pt is going to stay with a friend named [name] [phone] at [place], [place] .   [place] MSW, [place]  11/10/2015 11:38 AM

## 2015-11-10 NOTE — Progress Notes (Signed)
Pt is alert and oriented. She appears very irritable and argumentative. Reports sore throat that she believes is strep throat. Afebrile and now white spots noted on throat. 7.5/10 pain level. Tylenol given.

## 2015-11-10 NOTE — Progress Notes (Signed)
D: Pt denies SI/HI/AVH, affect is flat but brightens upon approach. Pt is pleasant and cooperative. Patient thoughts are less disorganized; less psychosis noted.  Pt appears less anxious and she is interacting with peers and staff appropriately.  A: Pt was offered support and encouragement. Pt was given scheduled medications. Pt was encouraged to attend groups. Q 15 minute checks were done for safety.  R:Pt attends groups and interacts well with peers and staff. Pt is taking medication. Pt has no complaints.Pt receptive to treatment and safety maintained on unit.

## 2015-11-10 NOTE — Progress Notes (Signed)
  [facility] Adult Case Management Discharge Plan :  Will you be returning to the same living situation after discharge:  No. At discharge, do you have transportation home?: Yes,  friend  Do you have the ability to pay for your medications: Yes,  Insurance, Income   Release of information consent forms completed and in the chart;  Patient's signature needed at discharge.  Patient to Follow up at: Follow-up Information    Follow up with Dr. Sarina Ser. Schedule an appointment as soon as possible for a visit on 11/15/2015.   Why:  Follow up appointment  for skin doctor  at 10:45 AM Please come 15 minutes early    Contact information:   [place], [place] [address] Phone: [phone]      Follow up with [facility]  On 11/14/2015.   Why:  Your hospital follow up is on Tuesday June 27th at 9:00am. Please bring your ID, Social Security card, insurance card, and proof of income.    Contact information:   [address]  [place], [place] [address] Phone: [phone] Fax: [phone]      Next level of care provider has access to [place] and Suicide Prevention discussed: Yes,  with patient and friend.   Have you used any form of tobacco in the last 30 days? (Cigarettes, Smokeless Tobacco, Cigars, and/or Pipes): Yes  Has patient been referred to the Quitline?: Patient refused referral  Patient has been referred for addiction treatment: [place] MSW, [place]  11/10/2015, 11:32 AM

## 2015-11-28 ENCOUNTER — Other Ambulatory Visit: Payer: Self-pay | Admitting: Family Medicine

## 2015-11-28 DIAGNOSIS — N6314 Unspecified lump in the right breast, lower inner quadrant: Secondary | ICD-10-CM

## 2015-12-22 ENCOUNTER — Other Ambulatory Visit: Payer: Self-pay

## 2016-01-03 ENCOUNTER — Other Ambulatory Visit: Payer: Self-pay | Admitting: Radiology

## 2016-01-05 ENCOUNTER — Telehealth: Payer: Self-pay | Admitting: *Deleted

## 2016-01-05 NOTE — Telephone Encounter (Signed)
Left message for a return phone call to schedule for [facility]. 

## 2016-01-08 ENCOUNTER — Telehealth: Payer: Self-pay | Admitting: *Deleted

## 2016-01-08 DIAGNOSIS — C50211 Malignant neoplasm of upper-inner quadrant of right female breast: Secondary | ICD-10-CM | POA: Insufficient documentation

## 2016-01-08 HISTORY — DX: Malignant neoplasm of upper-inner quadrant of right female breast: C50.211

## 2016-01-08 NOTE — Telephone Encounter (Signed)
Confirmed [facility] for 01/10/16 at 1215pm .  Instructions and contact information given. 

## 2016-01-10 ENCOUNTER — Ambulatory Visit (HOSPITAL_BASED_OUTPATIENT_CLINIC_OR_DEPARTMENT_OTHER): Payer: Medicare Other | Admitting: Hematology and Oncology

## 2016-01-10 ENCOUNTER — Ambulatory Visit: Payer: Medicare Other | Attending: General Surgery | Admitting: Physical Therapy

## 2016-01-10 ENCOUNTER — Encounter: Payer: Self-pay | Admitting: Physical Therapy

## 2016-01-10 ENCOUNTER — Encounter: Payer: Self-pay | Admitting: Hematology and Oncology

## 2016-01-10 ENCOUNTER — Other Ambulatory Visit: Payer: Self-pay | Admitting: General Surgery

## 2016-01-10 ENCOUNTER — Other Ambulatory Visit (HOSPITAL_BASED_OUTPATIENT_CLINIC_OR_DEPARTMENT_OTHER): Payer: Medicare Other

## 2016-01-10 ENCOUNTER — Ambulatory Visit
Admission: RE | Admit: 2016-01-10 | Discharge: 2016-01-10 | Disposition: A | Discharge status: Home / Self Care | Payer: No Typology Code available for payment source | Source: Ambulatory Visit | Attending: Radiation Oncology | Admitting: Radiation Oncology

## 2016-01-10 DIAGNOSIS — M25612 Stiffness of left shoulder, not elsewhere classified: Secondary | ICD-10-CM | POA: Insufficient documentation

## 2016-01-10 DIAGNOSIS — C50211 Malignant neoplasm of upper-inner quadrant of right female breast: Secondary | ICD-10-CM | POA: Insufficient documentation

## 2016-01-10 DIAGNOSIS — M25611 Stiffness of right shoulder, not elsewhere classified: Secondary | ICD-10-CM | POA: Diagnosis present

## 2016-01-10 DIAGNOSIS — Z170 Estrogen receptor positive status [ER+]: Secondary | ICD-10-CM | POA: Diagnosis not present

## 2016-01-10 DIAGNOSIS — C50911 Malignant neoplasm of unspecified site of right female breast: Secondary | ICD-10-CM

## 2016-01-10 DIAGNOSIS — R293 Abnormal posture: Secondary | ICD-10-CM | POA: Diagnosis present

## 2016-01-10 LAB — CBC WITH DIFFERENTIAL/PLATELET
BASO%: 0.4 % (ref 0.0–2.0)
BASOS ABS: 0 10*3/uL (ref 0.0–0.1)
EOS%: 3.7 % (ref 0.0–7.0)
Eosinophils Absolute: 0.3 10*3/uL (ref 0.0–0.5)
HEMATOCRIT: 42.2 % (ref 34.8–46.6)
HEMOGLOBIN: 13.9 g/dL (ref 11.6–15.9)
LYMPH#: 1.6 10*3/uL (ref 0.9–3.3)
LYMPH%: 17.3 % (ref 14.0–49.7)
MCH: 29.6 pg (ref 25.1–34.0)
MCHC: 32.9 g/dL (ref 31.5–36.0)
MCV: 90 fL (ref 79.5–101.0)
MONO#: 1 10*3/uL — ABNORMAL HIGH (ref 0.1–0.9)
MONO%: 11.1 % (ref 0.0–14.0)
NEUT%: 67.5 % (ref 38.4–76.8)
NEUTROS ABS: 6.2 10*3/uL (ref 1.5–6.5)
Platelets: 293 10*3/uL (ref 145–400)
RBC: 4.69 10*6/uL (ref 3.70–5.45)
RDW: 15.1 % — AB (ref 11.2–14.5)
WBC: 9.2 10*3/uL (ref 3.9–10.3)

## 2016-01-10 LAB — COMPREHENSIVE METABOLIC PANEL
ALBUMIN: 3.5 g/dL (ref 3.5–5.0)
ALK PHOS: 98 U/L (ref 40–150)
ALT: 13 U/L (ref 0–55)
AST: 17 U/L (ref 5–34)
Anion Gap: 10 mEq/L (ref 3–11)
BUN: 9.8 mg/dL (ref 7.0–26.0)
CALCIUM: 9.9 mg/dL (ref 8.4–10.4)
CO2: 29 mEq/L (ref 22–29)
CREATININE: 0.8 mg/dL (ref 0.6–1.1)
Chloride: 104 mEq/L (ref 98–109)
EGFR: 86 mL/min/{1.73_m2} — ABNORMAL LOW (ref >90)
GLUCOSE: 111 mg/dL (ref 70–140)
Potassium: 3.8 mEq/L (ref 3.5–5.1)
Sodium: 143 mEq/L (ref 136–145)
TOTAL PROTEIN: 7.1 g/dL (ref 6.4–8.3)

## 2016-01-10 NOTE — Patient Instructions (Signed)

## 2016-01-10 NOTE — Progress Notes (Signed)
Radiation Oncology         [phone] ________________________________  Name: [place] MRN: [id number]  Date: 01/10/2016  DOB: 27-Mar-1956  YP:[id number], [place], FNP  Excell Seltzer, MD     REFERRING PHYSICIAN: Excell Seltzer, MD   DIAGNOSIS: The encounter diagnosis was Breast cancer of upper-inner quadrant of right female breast ([place]).   HISTORY OF PRESENT ILLNESS: [place] is a 60 y.o. female seen in the multidisciplinary breast clinic for a new diagnosis of breast cancer. She was found to have a palpable mass of the right breast. Diagnostic imaging revealed a normal mammogram, but ultrasound detected a 1.6 cm irregular mass. No axillary adenopathy was noted on ultrasound,a nd on 01/03/16, a biopsy was performed of the right breast mass revealing ER/PR positive, HER2 negative grade 1 invasive lobular carcinoma. She comes today for further discussion of the role of radiotherapy with Dr. Lisbeth Renshaw.    PREVIOUS RADIATION THERAPY: No   PAST MEDICAL HISTORY:  Past Medical History:  Diagnosis Date  . Asthma   . Borderline personality disorder   . COPD (chronic obstructive pulmonary disease) ([place])   . Depression   . Fibromyalgia   . Generalized anxiety disorder   . Hypertension   . Multiple gastric ulcers   . Sciatica        PAST SURGICAL HISTORY: Past Surgical History:  Procedure Laterality Date  . DILATION AND CURETTAGE OF UTERUS    . GYNECOLOGIC CRYOSURGERY       FAMILY HISTORY:  Family History  Problem Relation Age of Onset  . Colon cancer Maternal Grandmother      SOCIAL HISTORY:  reports that she has been smoking Cigarettes.  She has been smoking about 2.00 packs per day. She has never used smokeless tobacco. She reports that she does not drink alcohol or use drugs. The patient was recently hospitalized for suicidal ideation at [facility] with [facility]. She was offered placement in a group home, but declined and is now living with a  friend.    ALLERGIES: Bactrim [sulfamethoxazole-trimethoprim]; Diazepam; Prozac [fluoxetine]; Tetracycline; Aspirin; Cefdinir; Nsaids; Risperidone; Telmisartan; Trazodone; and Clarithromycin   MEDICATIONS:  Current Outpatient Prescriptions  Medication Sig Dispense Refill  . albuterol (PROVENTIL HFA;VENTOLIN HFA) 108 (90 Base) MCG/ACT inhaler Inhale 1-2 puffs into the lungs every 6 (six) hours as needed for wheezing or shortness of breath. 1 Inhaler 0  . clonazePAM (KLONOPIN) 0.5 MG tablet Take 1 tablet (0.5 mg total) by mouth 3 (three) times daily as needed (anxiety or sleep). 90 tablet 0  . docusate sodium (COLACE) 100 MG capsule Take 2 capsules (200 mg total) by mouth 2 (two) times daily. 60 capsule 0  . hydrOXYzine (ATARAX/VISTARIL) 50 MG tablet Take 1 tablet (50 mg total) by mouth at bedtime. 30 tablet 0  . ketoconazole (NIZORAL) 2 % cream Apply topically 2 (two) times daily. 15 g 0  . OLANZapine (ZYPREXA) 15 MG tablet Take 1 tablet (15 mg total) by mouth at bedtime. 30 tablet 0  . pantoprazole (PROTONIX) 40 MG tablet Take 1 tablet (40 mg total) by mouth daily. 30 tablet 0  . polyethylene glycol (MIRALAX / GLYCOLAX) packet Take 17 g by mouth daily as needed for moderate constipation. 14 each 0  . QUEtiapine (SEROQUEL) 50 MG tablet Take 1 tablet (50 mg total) by mouth at bedtime. 30 tablet 0  . sertraline (ZOLOFT) 100 MG tablet Take 1 tablet (100 mg total) by mouth daily with breakfast. 30 tablet 0   No  current facility-administered medications for this encounter.      REVIEW OF SYSTEMS: On review of systems, the patient reports that she is doing well overall. She denies any concerns with her breast since her biopsy. She reports that she is in a good place right now and her depression is being managed. No other complaints are verbalized.     PHYSICAL EXAM:   Pain scale 0/10 In general this is a female who appears her stated age in no acute distress. She is alert and oriented x4 and  appropriate throughout the examination. She's alert and oriented x4 and appropriate throughout the examination. Cardiopulmonary assessment is negative for acute distress and she exhibits normal effort. Breast exam is deferred.   ECOG = 0  0 - Asymptomatic (Fully active, able to carry on all predisease activities without restriction)  1 - Symptomatic but completely ambulatory (Restricted in physically strenuous activity but ambulatory and able to carry out work of a light or sedentary nature. For example, light housework, office work)  2 - Symptomatic, <50% in bed during the day (Ambulatory and capable of all self care but unable to carry out any work activities. Up and about more than 50% of waking hours)  3 - Symptomatic, >50% in bed, but not bedbound (Capable of only limited self-care, confined to bed or chair 50% or more of waking hours)  4 - Bedbound (Completely disabled. Cannot carry on any self-care. Totally confined to bed or chair)  5 - Death   Eustace Pen MM, Creech RH, Tormey DC, et al. [phone]). "Toxicity and response criteria of the [facility]". [place] Oncol. 5 (6): 649-55    LABORATORY DATA:  Lab Results  Component Value Date   WBC 9.2 01/10/2016   HGB 13.9 01/10/2016   HCT 42.2 01/10/2016   MCV 90.0 01/10/2016   PLT 293 01/10/2016   Lab Results  Component Value Date   NA 143 01/10/2016   K 3.8 01/10/2016   CL 104 10/23/2015   CO2 29 01/10/2016   Lab Results  Component Value Date   ALT 13 01/10/2016   AST 17 01/10/2016   ALKPHOS 98 01/10/2016   BILITOT <0.30 01/10/2016      RADIOGRAPHY: No results found.     IMPRESSION/PLAN: 1. Stage IA, cT1c, Nx, M0 ER/PR positive, HER2 negative invasive lobular carcinoma of the right breast. Dr. Lisbeth Renshaw discusses the pathology findings and reviews the nature of invasive breast disease. The consensus from the breast conference included MRI to evaluate the extent of disease and if appropriate, breast  conservation with lumpectomy with  sentinel evaluation. Oncotype will be performed to determine if there is a role for chemotherapy. Provided that chemotherapy is not indicated, the patient's course would then be followed by external radiotherapy to the breast followed by antiestrogen therapy. We discussed the risks, benefits, short, and long term effects of radiotherapy, and the patient is interested in proceeding. Dr. Lisbeth Renshaw discusses the delivery and logistics of radiotherapy, and following lumpectomy, would recommend 6 1/2 weeks of radiotherapy over 33 fractions. We will see her back about 2 weeks after surgery to move forward with the simulation and planning process and anticipate starting radiotherapy about 4 weeks after surgery.    The above documentation reflects my direct findings during this shared patient visit. Please see the separate note by Dr. Lisbeth Renshaw on this date for the remainder of the patient's plan of care.    Carola Rhine, PAC

## 2016-01-10 NOTE — Therapy (Signed)
[place], [place], [address] Phone: [phone]   Fax:  [phone]  Physical Therapy Evaluation  Patient Details  Name: Montzerrat Brunell MRN: [id number] Date of Birth: 1955-09-15 Referring Provider: Dr. Excell Seltzer  Encounter Date: 01/10/2016      PT End of Session - 01/10/16 1606    Visit Number 1   Number of Visits 1   PT Start Time 4174   PT Stop Time 1500   PT Time Calculation (min) 31 min   Activity Tolerance Patient tolerated treatment well   Behavior During Therapy [facility] for tasks assessed/performed      Past Medical History:  Diagnosis Date  . Asthma   . Borderline personality disorder   . COPD (chronic obstructive pulmonary disease) ([place])   . Depression   . Fibromyalgia   . Generalized anxiety disorder   . Hypertension   . Multiple gastric ulcers   . Sciatica     Past Surgical History:  Procedure Laterality Date  . DILATION AND CURETTAGE OF UTERUS    . GYNECOLOGIC CRYOSURGERY      There were no vitals filed for this visit.       Subjective Assessment - 01/10/16 1606    Subjective Patient reports she is here today to be seen by a multidisciplinary team for her newly diagnosed right breast cancer.   Pertinent History Patient was diagnosed on 12/21/15 with right grade 1 invasive lobular carcinoma breast cancer.  It measures 1.6 cm located in the upper inner quadrant, is ER/PR positive and HER2 negative, and has a Ki67 of 10%.  She hsa also recently been hospitalized for severe depression.   Patient Stated Goals reduce lymphedema risk and learn post op shoulder ROM HEP            [facility] PT Assessment - 01/10/16 0001      Assessment   Medical Diagnosis Right breast cancer   Referring Provider Dr. Excell Seltzer   Onset Date/Surgical Date 12/21/15   Hand Dominance Right   Prior Therapy none     Precautions   Precautions Other (comment)   Precaution Comments Active  cancer; recent hospitalization for mental illness; osteoporosis     Restrictions   Weight Bearing Restrictions No     Balance Screen   Has the patient fallen in the past 6 months No   Has the patient had a decrease in activity level because of a fear of falling?  No   Is the patient reluctant to leave their home because of a fear of falling?  No     Home Environment   Living Environment Private residence   Living Arrangements Non-relatives/Friends  Roommate   Available Help at Discharge Friend(s)     Prior Function   Level of Independence Independent   Vocation On disability   Leisure She does not exercise     Cognition   Overall Cognitive Status Within Functional Limits for tasks assessed     Posture/Postural Control   Posture/Postural Control Postural limitations   Postural Limitations Rounded Shoulders;Forward head;Decreased thoracic kyphosis     ROM / Strength   AROM / PROM / Strength AROM;Strength     AROM   Overall AROM Comments Bilateral shoulders limited by increased thoracic kyphosis and osteoporosis   AROM Assessment Site Shoulder;Cervical   Right/Left Shoulder Right;Left   Right Shoulder Extension 38 Degrees   Right Shoulder Flexion 112 Degrees   Right Shoulder ABduction 128 Degrees   Right  Shoulder Internal Rotation 53 Degrees   Right Shoulder External Rotation 65 Degrees   Left Shoulder Extension 40 Degrees   Left Shoulder Flexion 102 Degrees  with c/o pain   Left Shoulder ABduction 81 Degrees   Left Shoulder Internal Rotation 47 Degrees   Left Shoulder External Rotation 67 Degrees   Cervical Flexion WNL   Cervical Extension 90% limited   Cervical - Right Side Bend 50% limited   Cervical - Left Side Bend 50% limited   Cervical - Right Rotation 50% limited   Cervical - Left Rotation 50% limited     Strength   Overall Strength Other (comment)  Not tested due to osteoporosis           LYMPHEDEMA/ONCOLOGY QUESTIONNAIRE - 01/10/16 1604      Type    Cancer Type Right breast cancer     Lymphedema Assessments   Lymphedema Assessments Upper extremities     Right Upper Extremity Lymphedema   10 cm Proximal to Ulnar Styloid Process 24.6 cm   Just Proximal to Ulnar Styloid Process 21.9 cm   At Base of 2nd Digit 19 cm   At Base of Thumb 14 cm   Other 17.8   Other 6.3     Left Upper Extremity Lymphedema   10 cm Proximal to Olecranon Process 24.3 cm   Olecranon Process 21.6 cm   10 cm Proximal to Ulnar Styloid Process 18.4 cm   Just Proximal to Ulnar Styloid Process 14.3 cm   Across Hand at [employer] 17.4 cm   At [place] of 2nd Digit 6 cm          Patient was instructed today in a home exercise program today for post op shoulder range of motion. These included active assist shoulder flexion in sitting, scapular retraction, wall walking with shoulder abduction, and hands behind head external rotation.  She was encouraged to do these twice a day, holding 3 seconds and repeating 5 times when permitted by her physician.                  PT Education - 01/10/16 1605    Education provided Yes   Education Details Lymphedema risk reduction and post op shoulder ROM HEP   Person(s) Educated Patient;Caregiver(s)   Methods Explanation;Demonstration;Handout   Comprehension Returned demonstration;Verbalized understanding               Lung Clinic Goals - 01/10/16 1614      Patient will be able to verbalize understanding of the benefit of exercise to decrease fatigue.   Time 1   Period Days   Status Achieved     Patient will be able to verbalize the importance of posture.   Time 1   Period Days   Status Achieved     Patient will be able to demonstrate diaphragmatic breathing for improved lung function.   Time 1   Period Days   Status Achieved             Plan - 01/10/16 1607    Clinical Impression Statement Patient was diagnosed on 12/21/15 with right grade 1 invasive lobular carcinoma breast  cancer.  It measures 1.6 cm located in the upper inner quadrant, is ER/PR positive and HER2 negative, and has a Ki67 of 10%.  She has also recently been hospitalized for severe depression.  When she was admitted, she weighed 106 pounds and has now increased to 140 pounds.  During her time prior to her hospitalization,  she was very depressed and inactive resulting in osteoporosis.  She did not shower for over a year during that time and was unable to care for herself.  Over the past 2 months, she has been doing much better and today appears to be doing quite well.  She had a friend with her who she reports is her main support, but her friend appeared very anxious and hyper.  Her medical team met prior to her assessment to determine a recommended treatment plan.  She is planning to have a right lumpectomy and sentinel node biopsy followed by radiation and anti-estrogen therapy.  She may benefit from post op PT to regain shoulder ROM and reduce lymphedema risk.  Her overall posture and range of motion is limited due to her inactivity over the past year while depressed so she may benefit from PT to improve overall activity tolerance.  Due to her personal social factors and medical comorbidities, her eval is of moderate complexity.   Rehab Potential Good   Clinical Impairments Affecting Rehab Potential Mental health   PT Frequency One time visit   PT Treatment/Interventions Patient/family education;Therapeutic exercise   PT Next Visit Plan Will f/u after surgery to determine needs   PT Home Exercise Plan Post op shoulder ROM HEP   Consulted and Agree with Plan of Care Patient;Family member/caregiver   Family Member Consulted friend      Patient will benefit from skilled therapeutic intervention in order to improve the following deficits and impairments:  Decreased strength, Pain, Postural dysfunction, Decreased knowledge of precautions, Impaired UE functional use, Decreased range of motion  Visit  Diagnosis: Carcinoma of upper-inner quadrant of female breast, right ([place]) - Plan: PT plan of care cert/re-cert  Abnormal posture - Plan: PT plan of care cert/re-cert  Stiffness of left shoulder, not elsewhere classified - Plan: PT plan of care cert/re-cert  Stiffness of right shoulder, not elsewhere classified - Plan: PT plan of care cert/re-cert      G-Codes - 38/32/91 1615    Functional Assessment Tool Used Clinical Judgement   Functional Limitation Other PT primary   Other PT Primary Current Status (B1660) At least 1 percent but less than 20 percent impaired, limited or restricted   Other PT Primary Goal Status (A0045) At least 1 percent but less than 20 percent impaired, limited or restricted   Other PT Primary Discharge Status (T9774) At least 1 percent but less than 20 percent impaired, limited or restricted     Patient will follow up at outpatient cancer rehab if needed following surgery.  If the patient requires physical therapy at that time, a specific plan will be dictated and sent to the referring physician for approval. The patient was educated today on appropriate basic range of motion exercises to begin post operatively and the importance of attending the After Breast Cancer class following surgery.  Patient was educated today on lymphedema risk reduction practices as it pertains to recommendations that will benefit the patient immediately following surgery.  She verbalized good understanding.  No additional physical therapy is indicated at this time.     Problem List Patient Active Problem List   Diagnosis Date Noted  . Breast cancer of upper-inner quadrant of right female breast ([place]) 01/08/2016  . Tobacco use disorder 10/24/2015  . Benign neoplasm of colon 10/24/2015  . Alcohol use disorder, severe, in sustained remission ([place]) 10/24/2015  . Borderline personality disorder 10/24/2015  . Severe recurrent major depression without psychotic features ([place]) 10/23/2015  .  Hypertension 10/23/2015  . Self neglect 10/23/2015  . Psoriasis 02/11/2012  . Chronic obstructive pulmonary disease ([place]) 11/15/2011  . Fibromyalgia 11/15/2011  . Chronic pain 10/24/2011    Annia Friendly, PT 01/10/16 4:17 PM  [place], [place], [address] Phone: [phone]   Fax:  [phone]  Name: Tammee Thielke MRN: [id number] Date of Birth: 1956-03-02

## 2016-01-10 NOTE — Assessment & Plan Note (Signed)
01/03/2016: Palpable right breast abnormality 1.6 cm at 2:00 position axilla negative ultrasound biopsy invasive lobular cancer grade 1, ER 95%, PR 95%, Ki-67 10%, HER-2 negative ratio 1.31, T1 cN0 stage IA clinical stage  Pathology and radiology counseling:Discussed with the patient, the details of pathology including the type of breast cancer,the clinical staging, the significance of ER, PR and HER-2/neu receptors and the implications for treatment. After reviewing the pathology in detail, we proceeded to discuss the different treatment options between surgery, radiation, chemotherapy, antiestrogen therapies.  Recommendations: Breast MRI because this is a lobular phenotype 1. Breast conserving surgery followed by 2. Oncotype DX testing to determine if chemotherapy would be of any benefit followed by 3. Adjuvant radiation therapy followed by 4. Adjuvant antiestrogen therapy  Oncotype counseling: I discussed Oncotype DX test. I explained to the patient that this is a 21 gene panel to evaluate patient tumors DNA to calculate recurrence score. This would help determine whether patient has high risk or intermediate risk or low risk breast cancer. She understands that if her tumor was found to be high risk, she would benefit from systemic chemotherapy. If low risk, no need of chemotherapy. If she was found to be intermediate risk, we would need to evaluate the score as well as other risk factors and determine if an abbreviated chemotherapy may be of benefit.  Return to clinic after surgery to discuss final pathology report and then determine if Oncotype DX testing will need to be sent.

## 2016-01-10 NOTE — Progress Notes (Signed)
Adjuvant and initial normal [place] NOTE  Patient Care Team: Judge Stall, FNP as PCP - General (Family Medicine) Excell Seltzer, MD as Consulting Physician (General Surgery) Nicholas Lose, MD as Consulting Physician (Hematology and Oncology) Kyung Rudd, MD as Consulting Physician (Radiation Oncology)  CHIEF COMPLAINTS/PURPOSE OF CONSULTATION:  Newly diagnosed breast cancer  HISTORY OF PRESENTING ILLNESS:  Elizabeth Cherry 60 y.o. female is here because of recent diagnosis of right breast cancer. Patient felt a palpable abnormality in the right breasts. She went to her primary care physician asking to be put on estrogen replacement therapy. Her primary care physician refused to put her on this and instead got a mammogram. The mammogram revealed an abnormality at the site of the palpable area of concern measuring 1.6 cm by ultrasound. This was biopsied by ultrasound which revealed invasive lobular cancer grade 1 that was ER/PR positive HER-2 negative with a Ki-67 of 10%. She was presented this morning of the multidisciplinary tumor board and she is here today to discuss a treatment plan.  I reviewed her records extensively and collaborated the history with the patient.  SUMMARY OF ONCOLOGIC HISTORY:   Breast cancer of upper-inner quadrant of right female breast ([place])   01/03/2016 Initial Diagnosis    Palpable right breast abnormality 1.6 cm at 2:00 position axilla negative ultrasound biopsy invasive lobular cancer grade 1, ER 95%, PR 95%, Ki-67 10%, HER-2 negative ratio 1.31, T1 cN0 stage IA clinical stage      MEDICAL HISTORY:  Past Medical History:  Diagnosis Date  . Asthma   . Borderline personality disorder   . COPD (chronic obstructive pulmonary disease) ([place])   . Depression   . Fibromyalgia   . Generalized anxiety disorder   . Hypertension   . Multiple gastric ulcers   . Sciatica     SURGICAL HISTORY: Past Surgical History:  Procedure Laterality  Date  . DILATION AND CURETTAGE OF UTERUS    . GYNECOLOGIC CRYOSURGERY      SOCIAL HISTORY: Social History   Social History  . Marital status: Single    Spouse name: N/A  . Number of children: N/A  . Years of education: N/A   Occupational History  . Not on file.   Social History Main Topics  . Smoking status: Current Every Day Smoker    Packs/day: 2.00    Types: Cigarettes  . Smokeless tobacco: Never Used  . Alcohol use No  . Drug use: No  . Sexual activity: Not on file   Other Topics Concern  . Not on file   Social History Narrative  . No narrative on file    FAMILY HISTORY: Family History  Problem Relation Age of Onset  . Colon cancer Maternal Grandmother     ALLERGIES:  is allergic to bactrim [sulfamethoxazole-trimethoprim]; diazepam; prozac [fluoxetine]; tetracycline; aspirin; cefdinir; nsaids; risperidone; telmisartan; trazodone; and clarithromycin.  MEDICATIONS:  Current Outpatient Prescriptions  Medication Sig Dispense Refill  . albuterol (PROVENTIL HFA;VENTOLIN HFA) 108 (90 Base) MCG/ACT inhaler Inhale 1-2 puffs into the lungs every 6 (six) hours as needed for wheezing or shortness of breath. 1 Inhaler 0  . clonazePAM (KLONOPIN) 0.5 MG tablet Take 1 tablet (0.5 mg total) by mouth 3 (three) times daily as needed (anxiety or sleep). 90 tablet 0  . docusate sodium (COLACE) 100 MG capsule Take 2 capsules (200 mg total) by mouth 2 (two) times daily. 60 capsule 0  . hydrOXYzine (ATARAX/VISTARIL) 50 MG tablet Take 1 tablet (50 mg total)  by mouth at bedtime. 30 tablet 0  . ketoconazole (NIZORAL) 2 % cream Apply topically 2 (two) times daily. 15 g 0  . OLANZapine (ZYPREXA) 15 MG tablet Take 1 tablet (15 mg total) by mouth at bedtime. 30 tablet 0  . pantoprazole (PROTONIX) 40 MG tablet Take 1 tablet (40 mg total) by mouth daily. 30 tablet 0  . polyethylene glycol (MIRALAX / GLYCOLAX) packet Take 17 g by mouth daily as needed for moderate constipation. 14 each 0  .  QUEtiapine (SEROQUEL) 50 MG tablet Take 1 tablet (50 mg total) by mouth at bedtime. 30 tablet 0  . sertraline (ZOLOFT) 100 MG tablet Take 1 tablet (100 mg total) by mouth daily with breakfast. 30 tablet 0   No current facility-administered medications for this visit.     REVIEW OF SYSTEMS:   Constitutional: Denies fevers, chills or abnormal night sweats Eyes: Denies blurriness of vision, double vision or watery eyes Ears, nose, mouth, throat, and face: Denies mucositis or sore throat Respiratory: Denies cough, dyspnea or wheezes Cardiovascular: Denies palpitation, chest discomfort or lower extremity swelling Gastrointestinal:  Denies nausea, heartburn or change in bowel habits Skin: Denies abnormal skin rashes Lymphatics: Denies new lymphadenopathy or easy bruising Neurological:Denies numbness, tingling or new weaknesses Behavioral/Psych: Patient had a previous problem with major depression and had apparently not taken a shower for 17 months. She has improved significantly on medications. She had also previously attempted suicide by drinking antifreeze. She is here today accompanied by a friend who appears to be extremely anxious and appears more panicked than our patient.  Breast:  Palpable area of concern in the right breast All other systems were reviewed with the patient and are negative.  PHYSICAL EXAMINATION: ECOG PERFORMANCE STATUS: 1 - Symptomatic but completely ambulatory  Vitals:   01/10/16 1305  BP: (!) 138/93  Pulse: (!) 102  Resp: 18  Temp: 98.5 F (36.9 C)   Filed Weights   01/10/16 1305  Weight: 140 lb 14.4 oz (63.9 kg)    GENERAL:alert, no distress and comfortable SKIN: skin color, texture, turgor are normal, no rashes or significant lesions EYES: normal, conjunctiva are pink and non-injected, sclera clear OROPHARYNX:no exudate, no erythema and lips, buccal mucosa, and tongue normal  NECK: supple, thyroid normal size, non-tender, without nodularity LYMPH:  no  palpable lymphadenopathy in the cervical, axillary or inguinal LUNGS: clear to auscultation and percussion with normal breathing effort HEART: regular rate & rhythm and no murmurs and no lower extremity edema ABDOMEN:abdomen soft, non-tender and normal bowel sounds Musculoskeletal:no cyanosis of digits and no clubbing  PSYCH: alert & oriented x 3 with fluent speech, Patient appears to be emotionally stable and has asked very relevant questions NEURO: no focal motor/sensory deficits BREAST:Postbiopsy changes. No palpable axillary or supraclavicular lymphadenopathy (exam performed in the presence of a chaperone)   LABORATORY DATA:  I have reviewed the data as listed Lab Results  Component Value Date   WBC 9.2 01/10/2016   HGB 13.9 01/10/2016   HCT 42.2 01/10/2016   MCV 90.0 01/10/2016   PLT 293 01/10/2016   Lab Results  Component Value Date   NA 143 01/10/2016   K 3.8 01/10/2016   CL 104 10/23/2015   CO2 29 01/10/2016    RADIOGRAPHIC STUDIES: I have personally reviewed the radiological reports and agreed with the findings in the report.  ASSESSMENT AND PLAN:  Breast cancer of upper-inner quadrant of right female breast ([place]) 01/03/2016: Palpable right breast abnormality 1.6 cm at 2:00  position axilla negative ultrasound biopsy invasive lobular cancer grade 1, ER 95%, PR 95%, Ki-67 10%, HER-2 negative ratio 1.31, T1 cN0 stage IA clinical stage  Pathology and radiology counseling:Discussed with the patient, the details of pathology including the type of breast cancer,the clinical staging, the significance of ER, PR and HER-2/neu receptors and the implications for treatment. After reviewing the pathology in detail, we proceeded to discuss the different treatment options between surgery, radiation, chemotherapy, antiestrogen therapies.  Recommendations: Breast MRI because this is a lobular phenotype 1. Breast conserving surgery followed by 2. Adjuvant radiation therapy followed by 3.  Adjuvant antiestrogen therapy  I will not order Oncotype DX testing because the patient is not interested in chemotherapy. Return to clinic after surgery to discuss final pathology   All questions were answered. The patient knows to call the clinic with any problems, questions or concerns.    Rulon Eisenmenger, MD 01/10/16

## 2016-01-13 ENCOUNTER — Other Ambulatory Visit: Payer: Self-pay | Admitting: Hematology and Oncology

## 2016-01-13 ENCOUNTER — Ambulatory Visit (HOSPITAL_COMMUNITY)
Admission: RE | Admit: 2016-01-13 | Discharge: 2016-01-13 | Disposition: A | Discharge status: Home / Self Care | Payer: Medicare Other | Source: Ambulatory Visit | Attending: General Surgery | Admitting: General Surgery

## 2016-01-13 DIAGNOSIS — C50211 Malignant neoplasm of upper-inner quadrant of right female breast: Secondary | ICD-10-CM

## 2016-01-17 ENCOUNTER — Telehealth: Payer: Self-pay | Admitting: *Deleted

## 2016-01-17 NOTE — Telephone Encounter (Signed)
Spoke to pt concerning [place] from 01/10/16. Denies questions or concerns regarding dx or care plan. Encourage pt to call with needs. Received verbal understanding.

## 2016-01-26 ENCOUNTER — Ambulatory Visit
Admission: RE | Admit: 2016-01-26 | Discharge: 2016-01-26 | Disposition: A | Discharge status: Home / Self Care | Payer: Medicare Other | Source: Ambulatory Visit | Attending: General Surgery | Admitting: General Surgery

## 2016-01-26 ENCOUNTER — Ambulatory Visit: Admission: RE | Admit: 2016-01-26 | Payer: Medicare Other | Source: Ambulatory Visit

## 2016-01-26 ENCOUNTER — Inpatient Hospital Stay
Admission: RE | Admit: 2016-01-26 | Discharge: 2016-01-26 | Disposition: A | Discharge status: Home / Self Care | Payer: Self-pay | Source: Ambulatory Visit | Attending: General Surgery | Admitting: General Surgery

## 2016-01-26 ENCOUNTER — Other Ambulatory Visit: Payer: Self-pay | Admitting: General Surgery

## 2016-01-26 DIAGNOSIS — C801 Malignant (primary) neoplasm, unspecified: Principal | ICD-10-CM

## 2016-01-26 DIAGNOSIS — C50219 Malignant neoplasm of upper-inner quadrant of unspecified female breast: Secondary | ICD-10-CM

## 2016-01-26 DIAGNOSIS — C7981 Secondary malignant neoplasm of breast: Secondary | ICD-10-CM

## 2016-01-26 MED ORDER — GADOBENATE DIMEGLUMINE 529 MG/ML IV SOLN
13.0000 mL | Freq: Once | INTRAVENOUS | Status: AC | PRN
  Administered 2016-01-26: 13 mL via INTRAVENOUS

## 2016-02-07 ENCOUNTER — Encounter (HOSPITAL_BASED_OUTPATIENT_CLINIC_OR_DEPARTMENT_OTHER): Payer: Self-pay | Admitting: *Deleted

## 2016-02-13 NOTE — Progress Notes (Signed)
Pt given 8 oz carton of boost breeze with verbal and written instruction to drink by 9 am morning of surgery and nothing else after midnight. Teach back and pt voiced understanding

## 2016-02-14 ENCOUNTER — Encounter (HOSPITAL_BASED_OUTPATIENT_CLINIC_OR_DEPARTMENT_OTHER)
Admission: RE | Disposition: A | Discharge status: Home / Self Care | Payer: Self-pay | Source: Ambulatory Visit | Attending: General Surgery

## 2016-02-14 ENCOUNTER — Ambulatory Visit (HOSPITAL_BASED_OUTPATIENT_CLINIC_OR_DEPARTMENT_OTHER)
Admission: RE | Admit: 2016-02-14 | Discharge: 2016-02-14 | Disposition: A | Discharge status: Home / Self Care | Payer: Medicare Other | Source: Ambulatory Visit | Attending: General Surgery | Admitting: General Surgery

## 2016-02-14 ENCOUNTER — Ambulatory Visit (HOSPITAL_BASED_OUTPATIENT_CLINIC_OR_DEPARTMENT_OTHER): Payer: Medicare Other | Admitting: Anesthesiology

## 2016-02-14 ENCOUNTER — Encounter (HOSPITAL_BASED_OUTPATIENT_CLINIC_OR_DEPARTMENT_OTHER): Payer: Self-pay | Admitting: Anesthesiology

## 2016-02-14 ENCOUNTER — Ambulatory Visit (HOSPITAL_COMMUNITY)
Admission: RE | Admit: 2016-02-14 | Discharge: 2016-02-14 | Disposition: A | Discharge status: Home / Self Care | Payer: Medicare Other | Source: Ambulatory Visit | Attending: General Surgery | Admitting: General Surgery

## 2016-02-14 DIAGNOSIS — Z79899 Other long term (current) drug therapy: Secondary | ICD-10-CM | POA: Diagnosis not present

## 2016-02-14 DIAGNOSIS — C50911 Malignant neoplasm of unspecified site of right female breast: Secondary | ICD-10-CM

## 2016-02-14 DIAGNOSIS — M797 Fibromyalgia: Secondary | ICD-10-CM | POA: Diagnosis not present

## 2016-02-14 DIAGNOSIS — F329 Major depressive disorder, single episode, unspecified: Secondary | ICD-10-CM | POA: Diagnosis not present

## 2016-02-14 DIAGNOSIS — I10 Essential (primary) hypertension: Secondary | ICD-10-CM | POA: Insufficient documentation

## 2016-02-14 DIAGNOSIS — Z170 Estrogen receptor positive status [ER+]: Secondary | ICD-10-CM | POA: Diagnosis not present

## 2016-02-14 DIAGNOSIS — F17200 Nicotine dependence, unspecified, uncomplicated: Secondary | ICD-10-CM | POA: Diagnosis not present

## 2016-02-14 DIAGNOSIS — C50211 Malignant neoplasm of upper-inner quadrant of right female breast: Secondary | ICD-10-CM | POA: Insufficient documentation

## 2016-02-14 DIAGNOSIS — J449 Chronic obstructive pulmonary disease, unspecified: Secondary | ICD-10-CM | POA: Diagnosis not present

## 2016-02-14 HISTORY — DX: Edema, unspecified: R60.9

## 2016-02-14 HISTORY — PX: BREAST LUMPECTOMY WITH RADIOACTIVE SEED AND SENTINEL LYMPH NODE BIOPSY: SHX6550

## 2016-02-14 LAB — POCT I-STAT, CHEM 8
BUN: 8 mg/dL (ref 6–20)
CREATININE: 0.6 mg/dL (ref 0.44–1.00)
Calcium, Ion: 1.12 mmol/L — ABNORMAL LOW (ref 1.15–1.40)
Chloride: 99 mmol/L — ABNORMAL LOW (ref 101–111)
GLUCOSE: 83 mg/dL (ref 65–99)
HEMATOCRIT: 44 % (ref 36.0–46.0)
Hemoglobin: 15 g/dL (ref 12.0–15.0)
POTASSIUM: 3.4 mmol/L — AB (ref 3.5–5.1)
Sodium: 139 mmol/L (ref 135–145)
TCO2: 27 mmol/L (ref 0–100)

## 2016-02-14 SURGERY — BREAST LUMPECTOMY WITH RADIOACTIVE SEED AND SENTINEL LYMPH NODE BIOPSY
Anesthesia: Regional | Site: Breast | Laterality: Right

## 2016-02-14 MED ORDER — TECHNETIUM TC 99M SULFUR COLLOID FILTERED
1.0000 | Freq: Once | INTRAVENOUS | Status: AC | PRN
  Administered 2016-02-14: 1 via INTRADERMAL

## 2016-02-14 MED ORDER — ALBUTEROL SULFATE (2.5 MG/3ML) 0.083% IN NEBU
INHALATION_SOLUTION | RESPIRATORY_TRACT | Status: AC
  Filled 2016-02-14: qty 3

## 2016-02-14 MED ORDER — DEXAMETHASONE SODIUM PHOSPHATE 10 MG/ML IJ SOLN
INTRAMUSCULAR | Status: AC
  Filled 2016-02-14: qty 1

## 2016-02-14 MED ORDER — CIPROFLOXACIN IN D5W 400 MG/200ML IV SOLN: 400.0000 mg | INTRAVENOUS | Status: DC

## 2016-02-14 MED ORDER — LIDOCAINE 2% (20 MG/ML) 5 ML SYRINGE
INTRAMUSCULAR | Status: AC
Start: 2016-02-14 — End: 2016-02-14
  Filled 2016-02-14: qty 5

## 2016-02-14 MED ORDER — ACETAMINOPHEN 500 MG PO TABS
ORAL_TABLET | ORAL | Status: AC
  Filled 2016-02-14: qty 2

## 2016-02-14 MED ORDER — SCOPOLAMINE 1 MG/3DAYS TD PT72: 1.0000 | MEDICATED_PATCH | Freq: Once | TRANSDERMAL | Status: DC | PRN

## 2016-02-14 MED ORDER — MIDAZOLAM HCL 2 MG/2ML IJ SOLN
1.0000 mg | INTRAMUSCULAR | Status: DC | PRN
  Administered 2016-02-14 (×2): 1 mg via INTRAVENOUS

## 2016-02-14 MED ORDER — HYDROMORPHONE HCL 1 MG/ML IJ SOLN
INTRAMUSCULAR | Status: AC
  Filled 2016-02-14: qty 1

## 2016-02-14 MED ORDER — ACETAMINOPHEN 500 MG PO TABS
1000.0000 mg | ORAL_TABLET | ORAL | Status: AC
  Administered 2016-02-14: 1000 mg via ORAL

## 2016-02-14 MED ORDER — CHLORHEXIDINE GLUCONATE CLOTH 2 % EX PADS: 6.0000 | MEDICATED_PAD | Freq: Once | CUTANEOUS | Status: DC

## 2016-02-14 MED ORDER — PROPOFOL 10 MG/ML IV BOLUS
INTRAVENOUS | Status: DC | PRN
  Administered 2016-02-14: 150 mg via INTRAVENOUS

## 2016-02-14 MED ORDER — ALBUTEROL SULFATE (2.5 MG/3ML) 0.083% IN NEBU
2.5000 mg | INHALATION_SOLUTION | Freq: Once | RESPIRATORY_TRACT | Status: AC
  Administered 2016-02-14: 2.5 mg via RESPIRATORY_TRACT

## 2016-02-14 MED ORDER — CIPROFLOXACIN IN D5W 400 MG/200ML IV SOLN
INTRAVENOUS | Status: AC
  Filled 2016-02-14: qty 200

## 2016-02-14 MED ORDER — VANCOMYCIN HCL IN DEXTROSE 1-5 GM/200ML-% IV SOLN
INTRAVENOUS | Status: AC
  Filled 2016-02-14: qty 200

## 2016-02-14 MED ORDER — BUPIVACAINE-EPINEPHRINE (PF) 0.5% -1:200000 IJ SOLN
INTRAMUSCULAR | Status: DC | PRN
  Administered 2016-02-14: 30 mL

## 2016-02-14 MED ORDER — FENTANYL CITRATE (PF) 100 MCG/2ML IJ SOLN
INTRAMUSCULAR | Status: AC
  Filled 2016-02-14: qty 2

## 2016-02-14 MED ORDER — MIDAZOLAM HCL 2 MG/2ML IJ SOLN
INTRAMUSCULAR | Status: AC
  Filled 2016-02-14: qty 2

## 2016-02-14 MED ORDER — FENTANYL CITRATE (PF) 100 MCG/2ML IJ SOLN
50.0000 ug | INTRAMUSCULAR | Status: DC | PRN
  Administered 2016-02-14: 100 ug via INTRAVENOUS

## 2016-02-14 MED ORDER — MIDAZOLAM HCL 5 MG/5ML IJ SOLN
INTRAMUSCULAR | Status: DC | PRN
  Administered 2016-02-14: 2 mg via INTRAVENOUS

## 2016-02-14 MED ORDER — LACTATED RINGERS IV SOLN
INTRAVENOUS | Status: DC
  Administered 2016-02-14 (×2): via INTRAVENOUS

## 2016-02-14 MED ORDER — GLYCOPYRROLATE 0.2 MG/ML IJ SOLN: 0.2000 mg | Freq: Once | INTRAMUSCULAR | Status: DC | PRN

## 2016-02-14 MED ORDER — HYDROMORPHONE HCL 1 MG/ML IJ SOLN
0.2500 mg | INTRAMUSCULAR | Status: DC | PRN
  Administered 2016-02-14 (×3): 0.5 mg via INTRAVENOUS

## 2016-02-14 MED ORDER — HYDROCODONE-ACETAMINOPHEN 5-325 MG PO TABS: 1.0000 | ORAL_TABLET | ORAL | 0 refills | Status: DC | PRN

## 2016-02-14 MED ORDER — GABAPENTIN 300 MG PO CAPS: 300.0000 mg | ORAL_CAPSULE | ORAL | Status: DC

## 2016-02-14 MED ORDER — ONDANSETRON HCL 4 MG/2ML IJ SOLN
INTRAMUSCULAR | Status: AC
  Filled 2016-02-14: qty 2

## 2016-02-14 MED ORDER — BUPIVACAINE-EPINEPHRINE (PF) 0.5% -1:200000 IJ SOLN
INTRAMUSCULAR | Status: DC | PRN
  Administered 2016-02-14: 20 mL via PERINEURAL

## 2016-02-14 MED ORDER — SODIUM CHLORIDE 0.9 % IV SOLN
INTRAVENOUS | Status: DC | PRN
  Administered 2016-02-14: 1000 mg via INTRAVENOUS

## 2016-02-14 MED ORDER — DEXAMETHASONE SODIUM PHOSPHATE 4 MG/ML IJ SOLN
INTRAMUSCULAR | Status: DC | PRN
  Administered 2016-02-14: 10 mg via INTRAVENOUS

## 2016-02-14 MED ORDER — FENTANYL CITRATE (PF) 100 MCG/2ML IJ SOLN
INTRAMUSCULAR | Status: DC | PRN
  Administered 2016-02-14: 100 ug via INTRAVENOUS
  Administered 2016-02-14: 25 ug via INTRAVENOUS

## 2016-02-14 MED ORDER — PROPOFOL 10 MG/ML IV BOLUS
INTRAVENOUS | Status: AC
  Filled 2016-02-14: qty 20

## 2016-02-14 SURGICAL SUPPLY — 53 items
BINDER BREAST LRG (GAUZE/BANDAGES/DRESSINGS) IMPLANT
BINDER BREAST MEDIUM (GAUZE/BANDAGES/DRESSINGS) IMPLANT
BINDER BREAST XLRG (GAUZE/BANDAGES/DRESSINGS) IMPLANT
BINDER BREAST XXLRG (GAUZE/BANDAGES/DRESSINGS) IMPLANT
BLADE SURG 15 STRL LF DISP TIS (BLADE) ×1 IMPLANT
BLADE SURG 15 STRL SS (BLADE) ×2
CANISTER SUC SOCK COL 7IN (MISCELLANEOUS) IMPLANT
CANISTER SUCT 1200ML W/VALVE (MISCELLANEOUS) IMPLANT
CHLORAPREP W/TINT 26ML (MISCELLANEOUS) ×3 IMPLANT
CLIP TI WIDE RED SMALL 6 (CLIP) ×6 IMPLANT
COVER BACK TABLE 60X90IN (DRAPES) ×3 IMPLANT
COVER MAYO STAND STRL (DRAPES) ×3 IMPLANT
COVER PROBE W GEL 5X96 (DRAPES) ×3 IMPLANT
DECANTER SPIKE VIAL GLASS SM (MISCELLANEOUS) IMPLANT
DERMABOND ADVANCED (GAUZE/BANDAGES/DRESSINGS) ×2
DERMABOND ADVANCED .7 DNX12 (GAUZE/BANDAGES/DRESSINGS) ×1 IMPLANT
DEVICE DUBIN W/COMP PLATE 8390 (MISCELLANEOUS) ×3 IMPLANT
DRAPE LAPAROSCOPIC ABDOMINAL (DRAPES) ×3 IMPLANT
DRAPE UTILITY XL STRL (DRAPES) ×3 IMPLANT
ELECT COATED BLADE 2.86 ST (ELECTRODE) ×3 IMPLANT
ELECT REM PT RETURN 9FT ADLT (ELECTROSURGICAL) ×3
ELECTRODE REM PT RTRN 9FT ADLT (ELECTROSURGICAL) ×1 IMPLANT
GLOVE BIO SURGEON STRL SZ 6.5 (GLOVE) ×4 IMPLANT
GLOVE BIO SURGEONS STRL SZ 6.5 (GLOVE) ×2
GLOVE BIOGEL PI IND STRL 6.5 (GLOVE) ×2 IMPLANT
GLOVE BIOGEL PI IND STRL 7.0 (GLOVE) ×1 IMPLANT
GLOVE BIOGEL PI IND STRL 8 (GLOVE) ×1 IMPLANT
GLOVE BIOGEL PI INDICATOR 6.5 (GLOVE) ×4
GLOVE BIOGEL PI INDICATOR 7.0 (GLOVE) ×2
GLOVE BIOGEL PI INDICATOR 8 (GLOVE) ×2
GLOVE ECLIPSE 7.5 STRL STRAW (GLOVE) ×3 IMPLANT
GOWN STRL REUS W/ TWL LRG LVL3 (GOWN DISPOSABLE) ×1 IMPLANT
GOWN STRL REUS W/ TWL XL LVL3 (GOWN DISPOSABLE) ×1 IMPLANT
GOWN STRL REUS W/TWL LRG LVL3 (GOWN DISPOSABLE) ×2
GOWN STRL REUS W/TWL XL LVL3 (GOWN DISPOSABLE) ×2
ILLUMINATOR WAVEGUIDE N/F (MISCELLANEOUS) IMPLANT
KIT MARKER MARGIN INK (KITS) ×3 IMPLANT
NDL SAFETY ECLIPSE 18X1.5 (NEEDLE) ×1 IMPLANT
NEEDLE HYPO 18GX1.5 SHARP (NEEDLE) ×2
NEEDLE HYPO 25X1 1.5 SAFETY (NEEDLE) ×6 IMPLANT
NS IRRIG 1000ML POUR BTL (IV SOLUTION) IMPLANT
PACK BASIN DAY SURGERY FS (CUSTOM PROCEDURE TRAY) ×3 IMPLANT
PENCIL BUTTON HOLSTER BLD 10FT (ELECTRODE) ×3 IMPLANT
SLEEVE SCD COMPRESS KNEE MED (MISCELLANEOUS) ×3 IMPLANT
SPONGE LAP 4X18 X RAY DECT (DISPOSABLE) ×3 IMPLANT
SUT MON AB 5-0 PS2 18 (SUTURE) ×3 IMPLANT
SUT VICRYL 3-0 CR8 SH (SUTURE) ×3 IMPLANT
SYR CONTROL 10ML LL (SYRINGE) ×6 IMPLANT
TOWEL OR 17X24 6PK STRL BLUE (TOWEL DISPOSABLE) ×3 IMPLANT
TOWEL OR NON WOVEN STRL DISP B (DISPOSABLE) ×3 IMPLANT
TUBE CONNECTING 20'X1/4 (TUBING)
TUBE CONNECTING 20X1/4 (TUBING) IMPLANT
YANKAUER SUCT BULB TIP NO VENT (SUCTIONS) IMPLANT

## 2016-02-14 NOTE — Op Note (Signed)
Preoperative Diagnosis: RIGHT BREAST CANCER  Postoprative Diagnosis: RIGHT BREAST CANCER  Procedure: Procedure(s): BREAST LUMPECTOMY WITH RADIOACTIVE SEED AND SENTINEL LYMPH NODE BIOPSY   Surgeon: Excell Seltzer T   Assistants: None  Anesthesia:  General LMA anesthesia  Indications: Patient is a 60 year old female who presents with a palpable lump in the upper inner right breast. Ultrasound showed a 1.6 cm mass and biopsy has revealed invasive lobular carcinoma. MRI shows an approximately 2.6 cm mass. Nodes were negative on imaging. After preoperative workup and discussion detailed elsewhere with elected to proceed with radioactive seed localized right breast lumpectomy and axillary sentinel lymph node biopsy is initial surgical treatment.    Procedure Detail:  Patient had previously undergone accurate placement of a radioactive seed at the tumor and clip site. C placement was confirmed in the holding area. She underwent a pectoral block by anesthesia. She underwent injection of 1 mCi of technetium sulfur colloid intradermally around the right nipple preoperatively. She was brought to the operating room, placed in the supine position on the operating table, and laryngeal mask general anesthesia induced. The right breast and axilla were widely sterilely prepped and draped. She received preoperative IV antibiotics. PAS were in place. Patient timeout was performed and correct procedure verified. Due to the size of the tumor and fairly superficial location and then breast tissue in the upper inner quadrant I used a transverse incision in a skin crease directly over the area of high counts. Dissection was carried down into the superficial subcutaneous tissue. There was an associated palpable abnormality. Fairly thin skin and subcutaneous flaps were developed short distance in all directions. Using the palpable abnormality and the neoprobe for guidance the somewhat oblong transversely oriented mass  was completely excised with cautery with surrounding normal breast tissue. The posterior margin was carried down to the chest wall. An approximately 5 x 3 x 2 cm specimen was excised. This was inked for margins. Specimen x-ray showed the seton marking clip centrally located within the specimen. To facilitate closure skin and subcutaneous flaps were raised superiorly and inferiorly and breast tissue was mobilized up off the chest wall superiorly and inferiorly. Hemostasis was assured. The lumpectomy cavity was marked with clips. The breast tissue was then closed with interrupted 3-0 Vicryl as was the subcutaneous tissue. Attention was turned to the sentinel node. A definite hot area in the right axilla was identified and a small transverse incision made. Dissection was carried down through the saphenous tissue with cautery and the clavipectoral fascia was incised. Using the neoprobe for guidance I dissected down onto a slightly enlarged lymph node which was elevated and completely excised with cautery. Ex vivo the node had counts of 2500. I could not feel any other palpable adenopathy in this thin patient. Background counts in the axilla were no greater than 20. This was sent as a single hot right axillary sentinel lymph node. Hemostasis was assured in the deep subcutaneous tissue closed with interrupted 3-0 Vicryl. Skin at both incisions was infiltrated with Marcaine. Skin at both sites was closed with subcuticular 5-0 Monocryl and Dermabond. Sponge needle and instrument counts were correct.    Findings:  As above   Estimated Blood Loss:  Minimal         Drains: None  Blood Given: none          Specimens: #1 right breast lumpectomy   #2 right axillary sentinel lymph node        Complications:  * No complications entered in  OR log *         Disposition: PACU - hemodynamically stable.         Condition: stable

## 2016-02-14 NOTE — Progress Notes (Signed)
Emotional support during breast injections °

## 2016-02-14 NOTE — Discharge Instructions (Signed)
[facility] °Office Phone Number [phone] ° °BREAST BIOPSY/ PARTIAL MASTECTOMY: POST OP INSTRUCTIONS ° °Always review your discharge instruction sheet given to you by the facility where your surgery was performed. ° °IF YOU HAVE DISABILITY OR FAMILY LEAVE FORMS, YOU MUST BRING THEM TO THE OFFICE FOR PROCESSING.  DO NOT GIVE THEM TO YOUR DOCTOR. ° °1. A prescription for pain medication may be given to you upon discharge.  Take your pain medication as prescribed, if needed.  If narcotic pain medicine is not needed, then you may take acetaminophen (Tylenol) or ibuprofen (Advil) as needed. °2. Take your usually prescribed medications unless otherwise directed °3. If you need a refill on your pain medication, please contact your pharmacy.  They will contact our office to request authorization.  Prescriptions will not be filled after 5pm or on week-ends. °4. You should eat very light the first 24 hours after surgery, such as soup, crackers, pudding, etc.  Resume your normal diet the day after surgery. °5. Most patients will experience some swelling and bruising in the breast.  Ice packs and a good support bra will help.  Swelling and bruising can take several days to resolve.  °6. It is common to experience some constipation if taking pain medication after surgery.  Increasing fluid intake and taking a stool softener will usually help or prevent this problem from occurring.  A mild laxative (Milk of Magnesia or Miralax) should be taken according to package directions if there are no bowel movements after 48 hours. °7. Unless discharge instructions indicate otherwise, you may remove your bandages 24-48 hours after surgery, and you may shower at that time.  You may have steri-strips (small skin tapes) in place directly over the incision.  These strips should be left on the skin for 7-10 days.  If your surgeon used skin glue on the incision, you may shower in 24 hours.  The glue will flake off over the  next 2-3 weeks.  Any sutures or staples will be removed at the office during your follow-up visit. °8. ACTIVITIES:  You may resume regular daily activities (gradually increasing) beginning the next day.  Wearing a good support bra or sports bra minimizes pain and swelling.  You may have sexual intercourse when it is comfortable. °a. You may drive when you no longer are taking prescription pain medication, you can comfortably wear a seatbelt, and you can safely maneuver your car and apply brakes. °b. RETURN TO WORK:  ______________________________________________________________________________________ °9. You should see your doctor in the office for a follow-up appointment approximately two weeks after your surgery.  Your doctor’s nurse will typically make your follow-up appointment when she calls you with your pathology report.  Expect your pathology report 2-3 business days after your surgery.  You may call to check if you do not hear from us after three days. °10. OTHER INSTRUCTIONS: _______________________________________________________________________________________________ _____________________________________________________________________________________________________________________________________ °_____________________________________________________________________________________________________________________________________ °_____________________________________________________________________________________________________________________________________ ° °WHEN TO CALL YOUR DOCTOR: °1. Fever over 101.0 °2. Nausea and/or vomiting. °3. Extreme swelling or bruising. °4. Continued bleeding from incision. °5. Increased pain, redness, or drainage from the incision. ° °The clinic staff is available to answer your questions during regular business hours.  Please don’t hesitate to call and ask to speak to one of the nurses for clinical concerns.  If you have a medical emergency, go to the nearest  emergency room or call 911.  A surgeon from [facility] is always on call at the hospital. ° °For further questions, please visit [email or web address]  ° ° ° °  Post Anesthesia Home Care Instructions ° °Activity: °Get plenty of rest for the remainder of the day. A responsible adult should stay with you for 24 hours following the procedure.  °For the next 24 hours, DO NOT: °-Drive a car °-Operate machinery °-Drink alcoholic beverages °-Take any medication unless instructed by your physician °-Make any legal decisions or sign important papers. ° °Meals: °Start with liquid foods such as gelatin or soup. Progress to regular foods as tolerated. Avoid greasy, spicy, heavy foods. If nausea and/or vomiting occur, drink only clear liquids until the nausea and/or vomiting subsides. Call your physician if vomiting continues. ° °Special Instructions/Symptoms: °Your throat may feel dry or sore from the anesthesia or the breathing tube placed in your throat during surgery. If this causes discomfort, gargle with warm salt water. The discomfort should disappear within 24 hours. ° °If you had a scopolamine patch placed behind your ear for the management of post- operative nausea and/or vomiting: ° °1. The medication in the patch is effective for 72 hours, after which it should be removed.  Wrap patch in a tissue and discard in the trash. Wash hands thoroughly with soap and water. °2. You may remove the patch earlier than 72 hours if you experience unpleasant side effects which may include dry mouth, dizziness or visual disturbances. °3. Avoid touching the patch. Wash your hands with soap and water after contact with the patch. °  ° °

## 2016-02-14 NOTE — Progress Notes (Signed)
Assisted Dr. Edmond Fitzgerald with right, ultrasound guided, pectoralis block. Side rails up, monitors on throughout procedure. See vital signs in flow sheet. Tolerated Procedure well. 

## 2016-02-14 NOTE — Anesthesia Postprocedure Evaluation (Signed)
Anesthesia Post Note  Patient: Elizabeth Cherry  Procedure(s) Performed: Procedure(s) (LRB): BREAST LUMPECTOMY WITH RADIOACTIVE SEED AND SENTINEL LYMPH NODE BIOPSY (Right)  Patient location during evaluation: PACU Anesthesia Type: General and Regional Level of consciousness: awake and alert Pain management: pain level controlled Vital Signs Assessment: post-procedure vital signs reviewed and stable Respiratory status: spontaneous breathing, nonlabored ventilation and respiratory function stable Cardiovascular status: blood pressure returned to baseline and stable Postop Assessment: no signs of nausea or vomiting Anesthetic complications: no    Last Vitals:  Vitals:   02/14/16 1430 02/14/16 1445  BP: 132/88 129/86  Pulse: 90 87  Resp: 12 11  Temp:      Last Pain:  Vitals:   02/14/16 1430  TempSrc:   PainSc: 2                  Karem Farha,W. EDMOND

## 2016-02-14 NOTE — Transfer of Care (Signed)
Immediate Anesthesia Transfer of Care Note  Patient: Breyanna Caldon  Procedure(s) Performed: Procedure(s): BREAST LUMPECTOMY WITH RADIOACTIVE SEED AND SENTINEL LYMPH NODE BIOPSY (Right)  Patient Location: PACU  Anesthesia Type:GA combined with regional for post-op pain  Level of Consciousness: sedated  Airway & Oxygen Therapy: Patient Spontanous Breathing and Patient connected to face mask oxygen  Post-op Assessment: Report given to RN and Post -op Vital signs reviewed and stable  Post vital signs: Reviewed and stable  Last Vitals:  Vitals:   02/14/16 1112 02/14/16 1113  BP: 119/77   Pulse: 83 70  Resp: 14 11  Temp:      Last Pain:  Vitals:   02/14/16 1032  TempSrc: Oral      Patients Stated Pain Goal: 0 ([id number])  Complications: No apparent anesthesia complications

## 2016-02-14 NOTE — H&P (Signed)
History of Present Illness The patient is a 60 year old female who presents with breast cancer. She is a post menopausal female referred by Dr. Elige Radon for evaluation of recently diagnosed carcinoma of the right breast. she states she has been able to feel a lump in her upper right breast for about a year which has not really changed. She was having a lot of problems with depression and did not follow up on this until recently. She presented for imaging at [facility]. Subsequent imaging included diagnostic mamogram showing dense breast tissue but no mass and ultrasound showing a 1.6 cm irregular mass in the upper inner right breast at 2 o'clock. An ultrasound guided breast biopsy was performed on 01/03/2016 with pathology revealing invasive lobular carcinoma of the breast. She is seen now in [facility] for initial treatment planning. She has experienced a lump as abe or crustingin or skin changes or nipple discharge or crusting. She does not have a personal history of a left needle biopsy at [facility] showing apparent atypia but no cancer per patient history.  Findings at that time were the following: Tumor size: 1.6 cm Tumor grade: 1 Estrogen Receptor: positive Progesterone Receptor: positive Her-2 neu: negative Lymph node status: negative    Other Problems Alcohol Abuse Anxiety Disorder Arthritis Bladder Problems Cancer Chest pain Chronic Obstructive Lung Disease Depression Gastric Ulcer High blood pressure Kidney Stone Lump In Breast  Past Surgical History  Breast Biopsy Bilateral.  Diagnostic Studies History  Colonoscopy never Mammogram within last year Pap Smear 1-5 years ago  Medication History Proventil HFA (108 (90 Base)MCG/ACT Aerosol Soln, Inhalation) Active. KlonoPIN (0.5MG Tablet, one tablet Oral three times a day as needed for anxiety or sleep) Active. Colace (100MG Capsule, Oral) Active. HydrOXYzine HCl (50MG Tablet, 1 tablet Oral at bedtime)  Active. Nizoral (2% Cream, External two times daily) Active. ZyPREXA (15MG Tablet, one tablet Oral at bedtime) Active. Pantoprazole Sodium (40MG Tablet DR, Oral daily) Active. MiraLax (Oral daily, as needed) Active. SEROquel (50MG Tablet, one tablet Oral at bedtime) Active. Zoloft (100MG Tablet, Oral daily) Active. Medications Reconciled  Social History  Caffeine use Coffee. No alcohol use No drug use Tobacco use Current every day smoker.  Family History  Alcohol Abuse Family Members In General. Arthritis Mother. Cerebrovascular Accident Family Members In [place] Members In General. Depression Family Members In General, Father. Diabetes Mellitus Family Members In General. Heart Disease Father. Heart disease in female family member before age 21 Hypertension Father, Mother. Migraine Headache Family Members In General. Respiratory Condition Family Members In General. Thyroid problems Family Members In General.  Pregnancy / Birth History  Age at menarche 16 years. Age of menopause 53-55 Contraceptive History Oral contraceptives. Gravida 1 Maternal age 61-30 Para 0    Review of Systems  General Present- Fatigue, Night Sweats and Weight Gain. Not Present- Appetite Loss, Chills, Fever and Weight Loss. Skin Present- Dryness. Not Present- Change in Wart/Mole, Hives, Jaundice, New Lesions, Non-Healing Wounds, Rash and Ulcer. HEENT Present- Hoarseness, Ringing in the Ears, Sinus Pain and Wears glasses/contact lenses. Not Present- Earache, Hearing Loss, Nose Bleed, Oral Ulcers, Seasonal Allergies, Sore Throat, Visual Disturbances and Yellow Eyes. Respiratory Present- Chronic Cough and Wheezing. Not Present- Bloody sputum, Difficulty Breathing and Snoring. Breast Present- Breast Mass. Not Present- Breast Pain, Nipple Discharge and Skin Changes. Cardiovascular Present- Difficulty Breathing Lying Down, Palpitations and Swelling of  Extremities. Not Present- Chest Pain, Leg Cramps, Rapid Heart Rate and Shortness of Breath. Gastrointestinal Present- Abdominal Pain,  Bloody Stool and Change in Bowel Habits. Not Present- Bloating, Chronic diarrhea, Constipation, Difficulty Swallowing, Excessive gas, Gets full quickly at meals, Hemorrhoids, Indigestion, Nausea, Rectal Pain and Vomiting. Female Genitourinary Present- Frequency, Nocturia and Urgency. Not Present- Painful Urination and Pelvic Pain. Musculoskeletal Present- Back Pain, Joint Pain, Muscle Pain and Muscle Weakness. Not Present- Joint Stiffness and Swelling of Extremities. Neurological Present- Decreased Memory, Numbness, Tingling and Weakness. Not Present- Fainting, Headaches, Seizures, Tremor and Trouble walking. Psychiatric Present- Anxiety and Depression. Not Present- Bipolar, Change in Sleep Pattern, Fearful and Frequent crying. Endocrine Not Present- Cold Intolerance, Excessive Hunger, Hair Changes, Heat Intolerance, Hot flashes and New Diabetes. Hematology Not Present- Blood Thinners, Easy Bruising, Excessive bleeding, Gland problems, HIV and Persistent Infections.   Physical Exam Elizabeth Kitchen T. Nuri Branca MD;  The physical exam findings are as follows: Note:General: Alert, thin Caucasian female, in no distress Skin: Warm and dry without rash or infection. HEENT: No palpable masses or thyromegaly. Sclera nonicteric. Pupils equal round and reactive. Lymph nodes: No cervical, supraclavicular, nodes palpable. Breasts: There is an approximately 1.5 cm oblong freely movable mass palpable in the upper inner right breast at the 2:00 position. No fixation or skin changes. Lungs: bilateral mild wheezing and rhonchi without increased work of breathing. Cardiovascular: Regular rate and rhythm without murmer. No JVD or edema. Peripheral pulses intact. No carotid bruits. Abdomen: Nondistended. Soft and nontender. No masses palpable. No organomegaly. No palpable  hernias. Extremities: No edema or joint swelling or deformity. No chronic venous stasis changes. Neurologic: Alert and fully oriented. Gait normal. No focal weakness. Psychiatric: Normal mood and affect. Thought content appropriate with normal judgement and insight    Assessment & Plan  STAGE I BREAST CANCER, RIGHT (C50.911) Impression: 60 year old female with a new diagnosis of cancer of the right breast, upper inner quadrant. Clinical stage 1 a, ER positive, PR positive, HER-2 negative, E-cadherin negative lobular. I discussed with the patient her friend present today initial surgical treatment options. We discussed options of breast conservation with lumpectomy or total mastectomy and sentinal lymph node biopsy/dissection. she [email or web address] to be a good candidate for breast conservation. After discussion they have elected to proceed with rright breast lumpectomy and axillary sentinel lymph node biopsy. I have recommended preoperative MRI due to dense breast tissue and lobular phenotype. We discussed the indications and nature of the procedure, and expected recovery, in detail. Surgical risks including anesthetic complications, cardiorespiratory complications, bleeding, infection, wound healing complications, blood clots, lymphedema, local and distant recurrence and possible need for further surgery based on the final pathology was discussed and understood. Chemotherapy, hormonal therapy and radiation therapy have been discussed. They have been provided with literature regarding the treatment of breast cancer. All questions were answered. They understand and agree to proceed and we will go ahead with scheduling. she has a recent history of severe depression but seems to be doing well at this visit and understands I believe all issues very thoroughly. She also has ongoing asthma and possible bronchitis and I have asked her to return to her primary doctor for a preoperative checkup regarding her pulmonary  status.  Current Plans MRI, BOTH BREASTS ([id number])-This showed the biopsy proven malignancy, 2.3 cm, and no other abmormalities Pt Education - [facility] Breast Biopsy HCI: discussed with patient and provided information. radioactive seed localized right breast lumpectomy and axillary sentinel lymph node biopsy under general anesthesia as an outpatient

## 2016-02-14 NOTE — Anesthesia Preprocedure Evaluation (Addendum)
Anesthesia Evaluation  Patient identified by MRN, date of birth, ID band Patient awake    Reviewed: Allergy & Precautions, H&P , NPO status , Patient's Chart, lab work & pertinent test results  Airway Mallampati: II  TM Distance: >3 FB Neck ROM: Full    Dental no notable dental hx. (+) Teeth Intact, Dental Advisory Given   Pulmonary asthma , COPD,  COPD inhaler, Current Smoker,    Pulmonary exam normal  + wheezing      Cardiovascular hypertension,  Rhythm:Regular Rate:Normal     Neuro/Psych Depression negative neurological ROS     GI/Hepatic Neg liver ROS, PUD,   Endo/Other  negative endocrine ROS  Renal/GU negative Renal ROS  negative genitourinary   Musculoskeletal  (+) Fibromyalgia -  Abdominal   Peds  Hematology negative hematology ROS (+)   Anesthesia Other Findings   Reproductive/Obstetrics negative OB ROS                            Anesthesia Physical Anesthesia Plan  ASA: II  Anesthesia Plan: General and Regional   Post-op Pain Management: GA combined w/ Regional for post-op pain   Induction: Intravenous  Airway Management Planned: LMA  Additional Equipment:   Intra-op Plan:   Post-operative Plan: Extubation in OR  Informed Consent: I have reviewed the patients History and Physical, chart, labs and discussed the procedure including the risks, benefits and alternatives for the proposed anesthesia with the patient or authorized representative who has indicated his/her understanding and acceptance.   Dental advisory given  Plan Discussed with: CRNA  Anesthesia Plan Comments:         Anesthesia Quick Evaluation

## 2016-02-14 NOTE — Anesthesia Procedure Notes (Addendum)
Anesthesia Regional Block:  Pectoralis block  Pre-Anesthetic Checklist: ,, timeout performed, Correct Patient, Correct Site, Correct Laterality, Correct Procedure, Correct Position, site marked, Risks and benefits discussed, pre-op evaluation,  At surgeon's request and post-op pain management  Laterality: Right  Prep: Maximum Sterile Barrier Precautions used, chloraprep       Needles:  Injection technique: Single-shot  Needle Type: Echogenic Stimulator Needle     Needle Length: 9cm 9 cm Needle Gauge: 21 and 21 G    Additional Needles:  Procedures: ultrasound guided (picture in chart) Pectoralis block Narrative:  Start time: 02/14/2016 11:05 AM End time: 02/14/2016 11:15 AM Injection made incrementally with aspirations every 5 mL. Anesthesiologist: Roderic Palau  Additional Notes: 2% Lidocaine skin wheel.

## 2016-02-14 NOTE — Anesthesia Procedure Notes (Signed)
Procedure Name: LMA Insertion Date/Time: 02/14/2016 12:11 PM Performed by: Toula Moos L Pre-anesthesia Checklist: Patient identified, Emergency Drugs available, Suction available, Patient being monitored and Timeout performed Patient Re-evaluated:Patient Re-evaluated prior to inductionOxygen Delivery Method: Circle system utilized Preoxygenation: Pre-oxygenation with 100% oxygen Intubation Type: IV induction Ventilation: Mask ventilation without difficulty LMA: LMA inserted LMA Size: 4.0 Number of attempts: 1 Airway Equipment and Method: Bite block Placement Confirmation: positive ETCO2 Tube secured with: Tape Dental Injury: Teeth and Oropharynx as per pre-operative assessment

## 2016-02-15 ENCOUNTER — Encounter: Payer: Self-pay | Admitting: General Practice

## 2016-02-15 NOTE — Progress Notes (Signed)
Elizabeth Cherry presented to  [facility], receiving full packet of [place] team/resources. The patient scored a 2 on the Psychosocial Distress Thermometer which indicates mild distress. Followed up by phone to assess distress and other psychosocial needs.   ONCBCN DISTRESS SCREENING 02/15/2016  Screening Type Initial Screening  Distress experienced in past week (1-10) 2  Family Problem type Other (comment)  Physical Problem type Pain;Sleep/insomnia;Tingling hands/feet  Referral to support programs Yes   Was able to reach Elizabeth Cherry by phone for assessment and check-in.  She reports strong support from her AA group, therapist, and psychiatrist.  She reports little distress from cancer because she knows that "depression is much more likely to kill me than cancer; I've been through hell already, and now I'm back."  Per pt, she is feeling good after lumpectomy (very little pain and much more comfortable than after her colonoscopy). Elizabeth Cherry was upbeat and positive during this phone encounter.  Follow up needed: No.  Elizabeth Cherry is aware of ongoing [place] team/programming availability and states that she has no cancer-related needs and concerns at this time.   [place], [place], [facility] Pager [phone] Voicemail [phone]

## 2016-02-16 ENCOUNTER — Encounter (HOSPITAL_BASED_OUTPATIENT_CLINIC_OR_DEPARTMENT_OTHER): Payer: Self-pay | Admitting: General Surgery

## 2016-02-23 ENCOUNTER — Ambulatory Visit (HOSPITAL_BASED_OUTPATIENT_CLINIC_OR_DEPARTMENT_OTHER): Payer: Medicare Other | Admitting: Hematology and Oncology

## 2016-02-23 ENCOUNTER — Encounter: Payer: Self-pay | Admitting: Hematology and Oncology

## 2016-02-23 DIAGNOSIS — C50211 Malignant neoplasm of upper-inner quadrant of right female breast: Secondary | ICD-10-CM

## 2016-02-23 DIAGNOSIS — Z170 Estrogen receptor positive status [ER+]: Secondary | ICD-10-CM

## 2016-02-23 NOTE — Assessment & Plan Note (Signed)
Right lumpectomy 02/14/2016: Invasive lobular cancer, grade 1, 1.8 cm, margins negative but close, 0/1 lymph node negative, T1 CN 0 stage IA, ER 95%, PR 95%, Ki-67 10%, HER-2 negative ratio 1.31  Pathology counseling: I discussed the final pathology report of the patient provided  a copy of this report. I discussed the margins as well as lymph node surgeries. We also discussed the final staging along with previously performed ER/PR and HER-2/neu testing.  Recommendation: 1. Adjuvant radiation therapy followed by 2. Adjuvant antiestrogen therapy  I will not order Oncotype DX testing because the patient is not interested in chemotherapy. Return to clinic after radiation to start antiestrogen therapy in 3 months.

## 2016-02-23 NOTE — Progress Notes (Signed)
Patient Care Team: Judge Stall, FNP as PCP - General (Family Medicine) Excell Seltzer, MD as Consulting Physician (General Surgery) Nicholas Lose, MD as Consulting Physician (Hematology and Oncology) Kyung Rudd, MD as Consulting Physician (Radiation Oncology)  DIAGNOSIS: Breast cancer of upper-inner quadrant of right female breast [facility])   Staging form: Breast, AJCC 7th Edition   - Clinical stage from 01/10/2016: Stage IA (T1c, N0, M0) - Unsigned  SUMMARY OF ONCOLOGIC HISTORY:   Breast cancer of upper-inner quadrant of right female breast ([place])   01/03/2016 Initial Diagnosis    Palpable right breast abnormality 1.6 cm at 2:00 position axilla negative ultrasound biopsy invasive lobular cancer grade 1, ER 95%, PR 95%, Ki-67 10%, HER-2 negative ratio 1.31, T1 cN0 stage IA clinical stage      02/14/2016 Surgery    Right lumpectomy: Invasive lobular cancer, grade 1, 1.8 cm, margins negative but close, 0/1 lymph node negative, T1 CN 0 stage IA, ER 95%, PR 95%, Ki-67 10%, HER-2 negative ratio 1.31       CHIEF COMPLIANT: Follow-up after recent lumpectomy  INTERVAL HISTORY: Naraya Stoneberg is a 60 year old with above-mentioned history of right breast cancer underwent lumpectomy and is here today to discuss the pathology report. She is doing quite well from the surgery standpoint. She does have very minimal discomfort.  REVIEW OF SYSTEMS:   Constitutional: Denies fevers, chills or abnormal weight loss Eyes: Denies blurriness of vision Ears, nose, mouth, throat, and face: Denies mucositis or sore throat Respiratory: Denies cough, dyspnea or wheezes Cardiovascular: Denies palpitation, chest discomfort Gastrointestinal:  Denies nausea, heartburn or change in bowel habits Skin: Denies abnormal skin rashes Lymphatics: Denies new lymphadenopathy or easy bruising Neurological:Denies numbness, tingling or new weaknesses Behavioral/Psych: Mood is stable, no new changes  Extremities: No lower  extremity edema Breast: Recent right lumpectomy All other systems were reviewed with the patient and are negative.  I have reviewed the past medical history, past surgical history, social history and family history with the patient and they are unchanged from previous note.  ALLERGIES:  is allergic to bactrim [sulfamethoxazole-trimethoprim]; diazepam; prozac [fluoxetine]; tetracycline; aspirin; cefdinir; nsaids; risperidone; telmisartan; trazodone; clarithromycin; and oxycodone.  MEDICATIONS:  Current Outpatient Prescriptions  Medication Sig Dispense Refill  . acetaminophen (TYLENOL) 500 MG tablet Take 1,000 mg by mouth every 6 (six) hours as needed.    Marland Kitchen albuterol (PROVENTIL HFA;VENTOLIN HFA) 108 (90 Base) MCG/ACT inhaler Inhale 1-2 puffs into the lungs every 6 (six) hours as needed for wheezing or shortness of breath. 1 Inhaler 0  . clonazePAM (KLONOPIN) 0.5 MG tablet Take 1 tablet (0.5 mg total) by mouth 3 (three) times daily as needed (anxiety or sleep). 90 tablet 0  . HYDROCHLOROTHIAZIDE PO Take by mouth.    Marland Kitchen HYDROcodone-acetaminophen (NORCO/VICODIN) 5-325 MG tablet Take 1-2 tablets by mouth every 4 (four) hours as needed for moderate pain or severe pain. 20 tablet 0  . hydrOXYzine (ATARAX/VISTARIL) 50 MG tablet Take 1 tablet (50 mg total) by mouth at bedtime. 30 tablet 0  . ketoconazole (NIZORAL) 2 % cream Apply topically 2 (two) times daily. 15 g 0  . OLANZapine (ZYPREXA) 15 MG tablet Take 1 tablet (15 mg total) by mouth at bedtime. 30 tablet 0  . pantoprazole (PROTONIX) 40 MG tablet Take 1 tablet (40 mg total) by mouth daily. 30 tablet 0  . polyethylene glycol (MIRALAX / GLYCOLAX) packet Take 17 g by mouth daily as needed for moderate constipation. 14 each 0  . QUEtiapine (SEROQUEL) 50 MG  tablet Take 1 tablet (50 mg total) by mouth at bedtime. 30 tablet 0  . sertraline (ZOLOFT) 100 MG tablet Take 1 tablet (100 mg total) by mouth daily with breakfast. 30 tablet 0  . traMADol (ULTRAM)  50 MG tablet Take by mouth every 6 (six) hours as needed.     No current facility-administered medications for this visit.     PHYSICAL EXAMINATION: ECOG PERFORMANCE STATUS: 1 - Symptomatic but completely ambulatory  Vitals:   02/23/16 1106  BP: 117/80  Pulse: 93  Resp: 18  Temp: 97.9 F (36.6 C)   Filed Weights   02/23/16 1106  Weight: 149 lb 1.6 oz (67.6 kg)    GENERAL:alert, no distress and comfortable SKIN: skin color, texture, turgor are normal, no rashes or significant lesions EYES: normal, Conjunctiva are pink and non-injected, sclera clear OROPHARYNX:no exudate, no erythema and lips, buccal mucosa, and tongue normal  NECK: supple, thyroid normal size, non-tender, without nodularity LYMPH:  no palpable lymphadenopathy in the cervical, axillary or inguinal LUNGS: clear to auscultation and percussion with normal breathing effort HEART: regular rate & rhythm and no murmurs and no lower extremity edema ABDOMEN:abdomen soft, non-tender and normal bowel sounds MUSCULOSKELETAL:no cyanosis of digits and no clubbing  NEURO: alert & oriented x 3 with fluent speech, no focal motor/sensory deficits EXTREMITIES: No lower extremity edema  LABORATORY DATA:  I have reviewed the data as listed   Chemistry      Component Value Date/Time   NA 139 02/14/2016 1048   NA 143 01/10/2016 1233   K 3.4 (L) 02/14/2016 1048   K 3.8 01/10/2016 1233   CL 99 (L) 02/14/2016 1048   CO2 29 01/10/2016 1233   BUN 8 02/14/2016 1048   BUN 9.8 01/10/2016 1233   CREATININE 0.60 02/14/2016 1048   CREATININE 0.8 01/10/2016 1233      Component Value Date/Time   CALCIUM 9.9 01/10/2016 1233   ALKPHOS 98 01/10/2016 1233   AST 17 01/10/2016 1233   ALT 13 01/10/2016 1233   BILITOT <0.30 01/10/2016 1233       Lab Results  Component Value Date   WBC 9.2 01/10/2016   HGB 15.0 02/14/2016   HCT 44.0 02/14/2016   MCV 90.0 01/10/2016   PLT 293 01/10/2016   NEUTROABS 6.2 01/10/2016      ASSESSMENT & PLAN:  Breast cancer of upper-inner quadrant of right female breast ([place]) Right lumpectomy 02/14/2016: Invasive lobular cancer, grade 1, 1.8 cm, margins negative but close, 0/1 lymph node negative, T1 CN 0 stage IA, ER 95%, PR 95%, Ki-67 10%, HER-2 negative ratio 1.31  Pathology counseling: I discussed the final pathology report of the patient provided  a copy of this report. I discussed the margins as well as lymph node surgeries. We also discussed the final staging along with previously performed ER/PR and HER-2/neu testing.  Recommendation: 1. Adjuvant radiation therapy  (at [facility]) followed by 2. Adjuvant antiestrogen therapy  I will not order Oncotype DX testing because the patient is not interested in chemotherapy. Return to clinic after radiation to start antiestrogen therapy in 3 months.   No orders of the defined types were placed in this encounter.  The patient has a good understanding of the overall plan. she agrees with it. she will call with any problems that may develop before the next visit here.   Rulon Eisenmenger, MD 02/23/16

## 2016-03-25 ENCOUNTER — Encounter: Payer: Self-pay | Admitting: *Deleted

## 2016-05-03 ENCOUNTER — Telehealth: Payer: Self-pay | Admitting: *Deleted

## 2016-05-03 NOTE — Telephone Encounter (Signed)
  Oncology Nurse Navigator Documentation  Navigator Location: [facility] (05/03/16 1000)   )Navigator Encounter Type: Telephone (05/03/16 1000) Telephone: [facility] Call (05/03/16 1000)       Genetic Counseling Date:  (None) (05/03/16 1000) Genetic Counseling Type:  (None) (05/03/16 1000) Plastic Surgery Consult Date:  (None) (05/03/16 1000)       Patient Visit Type: [id number] (05/03/16 1000) Treatment Phase: Final Radiation Tx (05/03/16 1000) Barriers/Navigation Needs: No barriers at this time;No Questions;No Needs (05/03/16 1000)   Interventions: None required (05/03/16 1000)            Acuity: Level 1 (05/03/16 1000)         Time Spent with Patient: 15 (05/03/16 1000)

## 2016-06-25 ENCOUNTER — Ambulatory Visit (HOSPITAL_BASED_OUTPATIENT_CLINIC_OR_DEPARTMENT_OTHER): Payer: Medicare Other | Admitting: Hematology and Oncology

## 2016-06-25 ENCOUNTER — Encounter: Payer: Self-pay | Admitting: Hematology and Oncology

## 2016-06-25 DIAGNOSIS — Z170 Estrogen receptor positive status [ER+]: Secondary | ICD-10-CM

## 2016-06-25 DIAGNOSIS — Z79810 Long term (current) use of selective estrogen receptor modulators (SERMs): Secondary | ICD-10-CM

## 2016-06-25 DIAGNOSIS — C50211 Malignant neoplasm of upper-inner quadrant of right female breast: Secondary | ICD-10-CM

## 2016-06-25 NOTE — Addendum Note (Signed)
Addended by: Jaci Carrel A on: 06/25/2016 02:05 PM   Modules accepted: Orders

## 2016-06-25 NOTE — Progress Notes (Signed)
Patient Care Team: Judge Stall, FNP as PCP - General (Family Medicine) Excell Seltzer, MD as Consulting Physician (General Surgery) Nicholas Lose, MD as Consulting Physician (Hematology and Oncology) Kyung Rudd, MD as Consulting Physician (Radiation Oncology)  DIAGNOSIS:  Encounter Diagnosis  Name Primary?  . Malignant neoplasm of upper-inner quadrant of right breast in female, estrogen receptor positive ([place])     SUMMARY OF ONCOLOGIC HISTORY:   Breast cancer of upper-inner quadrant of right female breast ([place])   01/03/2016 Initial Diagnosis    Palpable right breast abnormality 1.6 cm at 2:00 position axilla negative ultrasound biopsy invasive lobular cancer grade 1, ER 95%, PR 95%, Ki-67 10%, HER-2 negative ratio 1.31, T1 cN0 stage IA clinical stage      02/14/2016 Surgery    Right lumpectomy: Invasive lobular cancer, grade 1, 1.8 cm, margins negative but close, 0/1 lymph node negative, T1 CN 0 stage IA, ER 95%, PR 95%, Ki-67 10%, HER-2 negative ratio 1.31      03/20/2016 - 05/08/2016 Radiation Therapy    Adjuvant radiation therapy at [facility]      05/20/2016 -  Anti-estrogen oral therapy    Anastrozole 1 mg by mouth daily       CHIEF COMPLIANT: Follow-up on anastrozole  INTERVAL HISTORY: Elizabeth Cherry is a 61 year old with above-mentioned history of right breast cancer treated with lumpectomy and radiation is currently on anastrozole therapy. She is tolerating anastrozole extremely well. She does complain of hot flashes and sweats but they're manageable. She denies any myalgias or arthralgias. She did very well with radiation therapy. She continues to smoke cigarettes.  REVIEW OF SYSTEMS:   Constitutional: Denies fevers, chills or abnormal weight loss Eyes: Denies blurriness of vision Ears, nose, mouth, throat, and face: Denies mucositis or sore throat Respiratory: Denies cough, dyspnea or wheezes Cardiovascular: Denies palpitation, chest discomfort Gastrointestinal:   Denies nausea, heartburn or change in bowel habits Skin: Denies abnormal skin rashes Lymphatics: Denies new lymphadenopathy or easy bruising Neurological:Denies numbness, tingling or new weaknesses Behavioral/Psych: Mood is stable, no new changes  Extremities: No lower extremity edema Breast:  denies any pain or lumps or nodules in either breasts All other systems were reviewed with the patient and are negative.  I have reviewed the past medical history, past surgical history, social history and family history with the patient and they are unchanged from previous note.  ALLERGIES:  is allergic to bactrim [sulfamethoxazole-trimethoprim]; diazepam; prozac [fluoxetine]; tetracycline; aspirin; cefdinir; nsaids; risperidone; telmisartan; trazodone; clarithromycin; and oxycodone.  MEDICATIONS:  Current Outpatient Prescriptions  Medication Sig Dispense Refill  . acetaminophen (TYLENOL) 500 MG tablet Take 1,000 mg by mouth every 6 (six) hours as needed.    Marland Kitchen albuterol (PROVENTIL HFA;VENTOLIN HFA) 108 (90 Base) MCG/ACT inhaler Inhale 1-2 puffs into the lungs every 6 (six) hours as needed for wheezing or shortness of breath. 1 Inhaler 0  . clonazePAM (KLONOPIN) 0.5 MG tablet Take 1 tablet (0.5 mg total) by mouth 3 (three) times daily as needed (anxiety or sleep). 90 tablet 0  . HYDROCHLOROTHIAZIDE PO Take by mouth.    Marland Kitchen HYDROcodone-acetaminophen (NORCO/VICODIN) 5-325 MG tablet Take 1-2 tablets by mouth every 4 (four) hours as needed for moderate pain or severe pain. 20 tablet 0  . hydrOXYzine (ATARAX/VISTARIL) 50 MG tablet Take 1 tablet (50 mg total) by mouth at bedtime. 30 tablet 0  . ketoconazole (NIZORAL) 2 % cream Apply topically 2 (two) times daily. 15 g 0  . OLANZapine (ZYPREXA) 15 MG tablet Take 1 tablet (  15 mg total) by mouth at bedtime. 30 tablet 0  . pantoprazole (PROTONIX) 40 MG tablet Take 1 tablet (40 mg total) by mouth daily. 30 tablet 0  . polyethylene glycol (MIRALAX / GLYCOLAX) packet  Take 17 g by mouth daily as needed for moderate constipation. 14 each 0  . QUEtiapine (SEROQUEL) 50 MG tablet Take 1 tablet (50 mg total) by mouth at bedtime. 30 tablet 0  . sertraline (ZOLOFT) 100 MG tablet Take 1 tablet (100 mg total) by mouth daily with breakfast. 30 tablet 0  . traMADol (ULTRAM) 50 MG tablet Take by mouth every 6 (six) hours as needed.     No current facility-administered medications for this visit.     PHYSICAL EXAMINATION: ECOG PERFORMANCE STATUS: 1 - Symptomatic but completely ambulatory  Vitals:   06/25/16 1147  BP: 116/73  Pulse: 98  Resp: 18  Temp: 97.8 F (36.6 C)   Filed Weights   06/25/16 1147  Weight: 175 lb 6.4 oz (79.6 kg)    GENERAL:alert, no distress and comfortable SKIN: skin color, texture, turgor are normal, no rashes or significant lesions EYES: normal, Conjunctiva are pink and non-injected, sclera clear OROPHARYNX:no exudate, no erythema and lips, buccal mucosa, and tongue normal  NECK: supple, thyroid normal size, non-tender, without nodularity LYMPH:  no palpable lymphadenopathy in the cervical, axillary or inguinal LUNGS: clear to auscultation and percussion with normal breathing effort HEART: regular rate & rhythm and no murmurs and no lower extremity edema ABDOMEN:abdomen soft, non-tender and normal bowel sounds MUSCULOSKELETAL:no cyanosis of digits and no clubbing  NEURO: alert & oriented x 3 with fluent speech, no focal motor/sensory deficits EXTREMITIES: No lower extremity edema   LABORATORY DATA:  I have reviewed the data as listed   Chemistry      Component Value Date/Time   NA 139 02/14/2016 1048   NA 143 01/10/2016 1233   K 3.4 (L) 02/14/2016 1048   K 3.8 01/10/2016 1233   CL 99 (L) 02/14/2016 1048   CO2 29 01/10/2016 1233   BUN 8 02/14/2016 1048   BUN 9.8 01/10/2016 1233   CREATININE 0.60 02/14/2016 1048   CREATININE 0.8 01/10/2016 1233      Component Value Date/Time   CALCIUM 9.9 01/10/2016 1233   ALKPHOS 98  01/10/2016 1233   AST 17 01/10/2016 1233   ALT 13 01/10/2016 1233   BILITOT <0.30 01/10/2016 1233       Lab Results  Component Value Date   WBC 9.2 01/10/2016   HGB 15.0 02/14/2016   HCT 44.0 02/14/2016   MCV 90.0 01/10/2016   PLT 293 01/10/2016   NEUTROABS 6.2 01/10/2016    ASSESSMENT & PLAN:  Breast cancer of upper-inner quadrant of right female breast ([place]) Right lumpectomy 02/14/2016: Invasive lobular cancer, grade 1, 1.8 cm, margins negative but close, 0/1 lymph node negative, T1 CN 0 stage IA, ER 95%, PR 95%, Ki-67 10%, HER-2 negative ratio 1.31 We did not order Oncotype DX testing because the patient is not interested in chemotherapy.  Treatment summary: 1. Adjuvant radiation therapy (at [facility]) completed 05/08/2016 2. Adjuvant antiestrogen therapy with anastrozole 1 mg daily since 05/20/2016  Anastrozole toxicities: Denies any myalgias. Does have occasional hot flashes.  Return to clinic in 6 months for toxicity check  I spent 25 minutes talking to the patient of which more than half was spent in counseling and coordination of care.  No orders of the defined types were placed in this encounter.  The patient has  a good understanding of the overall plan. she agrees with it. she will call with any problems that may develop before the next visit here.   Rulon Eisenmenger, MD 06/25/16

## 2016-06-25 NOTE — Assessment & Plan Note (Addendum)
Right lumpectomy 02/14/2016: Invasive lobular cancer, grade 1, 1.8 cm, margins negative but close, 0/1 lymph node negative, T1 CN 0 stage IA, ER 95%, PR 95%, Ki-67 10%, HER-2 negative ratio 1.31 We did not order Oncotype DX testing because the patient is not interested in chemotherapy.  Treatment summary: 1. Adjuvant radiation therapy (at [facility])  2. Adjuvant antiestrogen therapy  Letrozole counseling:We discussed the risks and benefits of anti-estrogen therapy with aromatase inhibitors. These include but not limited to insomnia, hot flashes, mood changes, vaginal dryness, bone density loss, and weight gain. We strongly believe that the benefits far outweigh the risks. Patient understands these risks and consented to starting treatment. Planned treatment duration is 5 years.  Return to clinic in 3 months for toxicity check

## 2016-06-29 ENCOUNTER — Telehealth: Payer: Self-pay | Admitting: Hematology and Oncology

## 2016-06-29 NOTE — Telephone Encounter (Signed)
Appointments scheduled per 2/6 LOS. Patient notified. °

## 2016-11-13 ENCOUNTER — Encounter: Payer: Self-pay | Admitting: Adult Health

## 2016-11-26 ENCOUNTER — Encounter: Payer: Self-pay | Admitting: Adult Health

## 2016-11-26 ENCOUNTER — Ambulatory Visit (HOSPITAL_BASED_OUTPATIENT_CLINIC_OR_DEPARTMENT_OTHER): Payer: Medicare Other | Admitting: Adult Health

## 2016-11-26 VITALS — BP 117/80 | HR 101 | Temp 97.9°F | Resp 17 | Ht 68.0 in | Wt 194.3 lb

## 2016-11-26 DIAGNOSIS — C50211 Malignant neoplasm of upper-inner quadrant of right female breast: Secondary | ICD-10-CM | POA: Diagnosis not present

## 2016-11-26 DIAGNOSIS — Z79811 Long term (current) use of aromatase inhibitors: Secondary | ICD-10-CM | POA: Diagnosis not present

## 2016-11-26 DIAGNOSIS — Z170 Estrogen receptor positive status [ER+]: Secondary | ICD-10-CM | POA: Diagnosis not present

## 2016-11-26 NOTE — Progress Notes (Signed)
CLINIC:  Survivorship   REASON FOR VISIT:  Routine follow-up post-treatment for a recent history of breast cancer.  BRIEF ONCOLOGIC HISTORY:    Breast cancer of upper-inner quadrant of right female breast ([place])   01/03/2016 Initial Diagnosis    Palpable right breast abnormality 1.6 cm at 2:00 position axilla negative ultrasound biopsy invasive lobular cancer grade 1, ER 95%, PR 95%, Ki-67 10%, HER-2 negative ratio 1.31, T1 cN0 stage IA clinical stage      02/14/2016 Surgery    Right lumpectomy: Invasive lobular cancer, grade 1, 1.8 cm, margins negative but close, 0/1 lymph node negative, T1 CN 0 stage IA, ER 95%, PR 95%, Ki-67 10%, HER-2 negative ratio 1.31      03/20/2016 - 05/08/2016 Radiation Therapy    Adjuvant radiation therapy at [facility]      05/20/2016 -  Anti-estrogen oral therapy    Anastrozole 1 mg by mouth daily       INTERVAL HISTORY:  Ms. Coghlan presents to the [facility] today for our initial meeting to review her survivorship care plan detailing her treatment course for breast cancer, as well as monitoring long-term side effects of that treatment, education regarding health maintenance, screening, and overall wellness and health promotion.     Overall, Ms. Vanepps reports feeling quite well.  She is taking Anastrozole daily and is tolerating it well.      REVIEW OF SYSTEMS:  Review of Systems  Constitutional: Negative for appetite change, chills, fatigue, fever and unexpected weight change.  HENT:   Negative for hearing loss.   Eyes: Negative for eye problems and icterus.  Respiratory: Negative for chest tightness, cough and shortness of breath.   Cardiovascular: Negative for chest pain, leg swelling and palpitations.  Gastrointestinal: Negative for abdominal distention and abdominal pain.  Endocrine: Positive for hot flashes (taking black cohosh for these).  Genitourinary: Negative for difficulty urinating.   Musculoskeletal: Negative for  arthralgias.  Skin: Negative for itching and rash.  Neurological: Negative for dizziness, extremity weakness, headaches and numbness.  Hematological: Negative for adenopathy. Does not bruise/bleed easily.  Psychiatric/Behavioral: Positive for depression (chronic and controlled). The patient is nervous/anxious.    Breast: Denies any new nodularity, masses, tenderness, nipple changes, or nipple discharge.      ONCOLOGY TREATMENT TEAM:  1. Surgeon:  Dr. Excell Seltzer at [facility] 2. Medical Oncologist: Dr. Lindi Adie  3. Radiation Oncologist: Dr. Lisbeth Renshaw    PAST MEDICAL/SURGICAL HISTORY:  Past Medical History:  Diagnosis Date  . Asthma   . Borderline personality disorder   . COPD (chronic obstructive pulmonary disease) ([place])   . Depression   . Edema    due to psychiatric meds per patient, on a diuretic  . Fibromyalgia   . Generalized anxiety disorder   . Hypertension    no meds now  . Multiple gastric ulcers   . Sciatica    Past Surgical History:  Procedure Laterality Date  . BREAST LUMPECTOMY WITH RADIOACTIVE SEED AND SENTINEL LYMPH NODE BIOPSY Right 02/14/2016   Procedure: BREAST LUMPECTOMY WITH RADIOACTIVE SEED AND SENTINEL LYMPH NODE BIOPSY;  Surgeon: Excell Seltzer, MD;  Location: [place];  Service: General;  Laterality: Right;  . DIAGNOSTIC LAPAROSCOPY    . DILATION AND CURETTAGE OF UTERUS    . GYNECOLOGIC CRYOSURGERY       ALLERGIES:  Allergies  Allergen Reactions  . Bactrim [Sulfamethoxazole-Trimethoprim] Hives  . Diazepam     Other reaction(s): Other (See Comments) Other Reaction: severe depression  .  Prozac [Fluoxetine] Swelling    Pt reports she is allergic to filler in prozac, her throat swell.   . Tetracycline Hives and Rash  . Aspirin     Gastric ulcers   . Cefdinir     Other reaction(s): Other (See Comments) Other Reaction: EXTREME EXHAUSTION  . Nsaids Other (See Comments)    Gastric ulcers   . Risperidone Nausea And  Vomiting  . Telmisartan     Other reaction(s): Other (See Comments) Other Reaction: OTHER REACTION  . Trazodone Other (See Comments)    Dry eyes  . Clarithromycin     Other reaction(s): Unknown  . Oxycodone Nausea Only     CURRENT MEDICATIONS:  Outpatient Encounter Prescriptions as of 11/26/2016  Medication Sig Note  . acetaminophen (TYLENOL) 500 MG tablet Take 1,000 mg by mouth every 6 (six) hours as needed.   Marland Kitchen albuterol (PROVENTIL HFA;VENTOLIN HFA) 108 (90 Base) MCG/ACT inhaler Inhale 1-2 puffs into the lungs every 6 (six) hours as needed for wheezing or shortness of breath.   . anastrozole (ARIMIDEX) 1 MG tablet    . Biotin w/ Vitamins C & E (HAIR/SKIN/NAILS PO) Take by mouth.   . Black Cohosh 540 MG CAPS Take by mouth.   Marland Kitchen BREO ELLIPTA 200-25 MCG/INH AEPB    . Calcium Carbonate (CALTRATE 600 PO) Take by mouth.   . clonazePAM (KLONOPIN) 0.5 MG tablet Take 1 tablet (0.5 mg total) by mouth 3 (three) times daily as needed (anxiety or sleep).   . Cyanocobalamin (VITAMIN B-12 PO) Take 5,000 mg by mouth.   . Fluticasone Furoate-Vilanterol (BREO ELLIPTA IN) Inhale into the lungs.   Marland Kitchen HYDROCHLOROTHIAZIDE PO Take by mouth. 02/14/2016: Patient did not know dosage   . hydrOXYzine (ATARAX/VISTARIL) 50 MG tablet Take 1 tablet (50 mg total) by mouth at bedtime.   Marland Kitchen ketoconazole (NIZORAL) 2 % cream Apply topically 2 (two) times daily.   . Montelukast Sodium (SINGULAIR PO) Take by mouth.   . OLANZapine (ZYPREXA) 15 MG tablet Take 1 tablet (15 mg total) by mouth at bedtime.   . pantoprazole (PROTONIX) 40 MG tablet Take 1 tablet (40 mg total) by mouth daily.   . pravastatin (PRAVACHOL) 40 MG tablet    . Probiotic Product (SOLUBLE FIBER/PROBIOTICS PO) Take by mouth.   . QUEtiapine (SEROQUEL) 50 MG tablet Take 1 tablet (50 mg total) by mouth at bedtime.   . sertraline (ZOLOFT) 100 MG tablet Take 1 tablet (100 mg total) by mouth daily with breakfast.   . traMADol (ULTRAM) 50 MG tablet Take by mouth  every 6 (six) hours as needed.   Marland Kitchen VITAMIN E PO Take by mouth.    No facility-administered encounter medications on file as of 11/26/2016.      ONCOLOGIC FAMILY HISTORY:  Family History  Problem Relation Age of Onset  . Colon cancer Maternal Grandmother      SOCIAL HISTORY:  Kaidan Spengler is single an dlives in [place], [place].  She is currently smoking, not yet ready to quit, denies, alcohol, or illicit drug use.     PHYSICAL EXAMINATION:  Vital Signs:   Vitals:   11/26/16 1310  BP: 117/80  Pulse: (!) 101  Resp: 17  Temp: 97.9 F (36.6 C)   Filed Weights   11/26/16 1310  Weight: 194 lb 4.8 oz (88.1 kg)   General: Well-nourished, well-appearing female in no acute distress.  She is unaccompanied today.   HEENT: Head is normocephalic.  Pupils equal and reactive to light.  Conjunctivae clear without exudate.  Sclerae anicteric. Oral mucosa is pink, moist.  Oropharynx is pink without lesions or erythema.  Lymph: No cervical, supraclavicular, or infraclavicular lymphadenopathy noted on palpation.  Cardiovascular: Regular rate and rhythm.Marland Kitchen Respiratory: Clear to auscultation bilaterally. Chest expansion symmetric; breathing non-labored.  GI: Abdomen soft and round; non-tender, non-distended. Bowel sounds normoactive.  GU: Deferred.  Neuro: No focal deficits. Steady gait.  Psych: Mood and affect normal and appropriate for situation.  Extremities: No edema. MSK: No focal spinal tenderness to palpation.  Full range of motion in bilateral upper extremities Skin: Warm and dry.  LABORATORY DATA:  None for this visit.  DIAGNOSTIC IMAGING:  None for this visit.      ASSESSMENT AND PLAN:  Ms.. Pudlo is a pleasant 61 y.o. female with Stage IA right breast invasive lobular carcinoma, ER+/PR+/HER2-, diagnosed in 12/2015, treated with lumpectomy, adjuvant radiation therapy, and anti-estrogen therapy with Anastrozole starting in January, 2018.  She presents to the  Survivorship Clinic for our initial meeting and routine follow-up post-completion of treatment for breast cancer.    1. Stage IA right breast cancer:  Ms. Sidle is continuing to recover from definitive treatment for breast cancer. She will follow-up with her medical oncologist, Dr. Lindi Adie 12/2016 with history and physical exam per surveillance protocol.  She will continue her anti-estrogen therapy with Anastrozole. Thus far, she is tolerating the Anastrozole well, with minimal side effects. She was instructed to make Dr. Lindi Adie or myself aware if she begins to experience any worsening side effects of the medication and I could see her back in clinic to help manage those side effects, as needed. Today, a comprehensive survivorship care plan and treatment summary was reviewed with the patient today detailing her breast cancer diagnosis, treatment course, potential late/long-term effects of treatment, appropriate follow-up care with recommendations for the future, and patient education resources.  A copy of this summary, along with a letter will be sent to the patient's primary care provider via mail/fax/In Basket message after today's visit.    2. Bone health:  Given Ms. Renaud's age/history of breast cancer and her current treatment regimen including anti-estrogen therapy with Anastrozole, she is at risk for bone demineralization.  Her last DEXA scan was about a year ago and she says she thinks it may have been low.  She isn't sure, but will get the results and have them faxed to me.  She has my card and fax number.  In the meantime, she was encouraged to increase her consumption of foods rich in calcium, as well as increase her weight-bearing activities.  She was given education on specific activities to promote bone health.  3. Cancer screening:  Due to Ms. Zieske's history and her age, she should receive screening for skin cancers, colon cancer, and gynecologic cancers.  The information and  recommendations are listed on the patient's comprehensive care plan/treatment summary and were reviewed in detail with the patient.    4. Health maintenance and wellness promotion: Ms. Lengel was encouraged to consume 5-7 servings of fruits and vegetables per day. We reviewed the "Nutrition Rainbow" handout, as well as the handout "Take Control of Your Health and Reduce Your Cancer Risk" from the [place].  She was also encouraged to engage in moderate to vigorous exercise for 30 minutes per day most days of the week. We discussed the [employer] fitness program, which is designed for cancer survivors to help them become more physically fit after cancer treatments.  She  was instructed to limit her alcohol consumption and was encouraged stop smoking.     5. Support services/counseling: It is not uncommon for this period of the patient's cancer care trajectory to be one of many emotions and stressors.  We discussed an opportunity for her to participate in the next session of [facility] ("Finding Your New Normal") support group series designed for patients after they have completed treatment.   Ms. Bhardwaj was encouraged to take advantage of our many other support services programs, support groups, and/or counseling in coping with her new life as a cancer survivor after completing anti-cancer treatment.  She was offered support today through active listening and expressive supportive counseling.  She was given information regarding our available services and encouraged to contact me with any questions or for help enrolling in any of our support group/programs.    Dispo:   -Return to cancer center in August 2018 for follow up with Dr. Lindi Adie -Mammogram due in 01/2017 -Follow up with surgery in February, 2018? -She is welcome to return back to the Survivorship Clinic at any time; no additional follow-up needed at this time.  -Consider referral back to survivorship as a long-term survivor for  continued surveillance  A total of (30) minutes of face-to-face time was spent with this patient with greater than 50% of that time in counseling and care-coordination.   Gardenia Phlegm, NP Survivorship Program [facility] [phone]   Note: PRIMARY CARE PROVIDER Judge Stall, [place] [phone]

## 2016-12-27 ENCOUNTER — Ambulatory Visit: Payer: Self-pay | Admitting: Hematology and Oncology

## 2017-01-06 ENCOUNTER — Other Ambulatory Visit (HOSPITAL_BASED_OUTPATIENT_CLINIC_OR_DEPARTMENT_OTHER): Payer: Self-pay | Admitting: Internal Medicine

## 2017-01-06 DIAGNOSIS — I6523 Occlusion and stenosis of bilateral carotid arteries: Secondary | ICD-10-CM

## 2017-01-09 ENCOUNTER — Other Ambulatory Visit: Payer: Self-pay | Admitting: Family Medicine

## 2017-01-09 DIAGNOSIS — M25569 Pain in unspecified knee: Secondary | ICD-10-CM

## 2017-01-09 DIAGNOSIS — M25469 Effusion, unspecified knee: Secondary | ICD-10-CM

## 2017-01-11 ENCOUNTER — Ambulatory Visit (HOSPITAL_BASED_OUTPATIENT_CLINIC_OR_DEPARTMENT_OTHER)
Admission: RE | Admit: 2017-01-11 | Discharge: 2017-01-11 | Disposition: A | Discharge status: Home / Self Care | Payer: Medicare Other | Source: Ambulatory Visit | Attending: Family Medicine | Admitting: Family Medicine

## 2017-01-11 DIAGNOSIS — M25561 Pain in right knee: Secondary | ICD-10-CM | POA: Diagnosis present

## 2017-01-11 DIAGNOSIS — M25469 Effusion, unspecified knee: Secondary | ICD-10-CM

## 2017-01-11 DIAGNOSIS — M25569 Pain in unspecified knee: Secondary | ICD-10-CM

## 2017-01-11 DIAGNOSIS — M7121 Synovial cyst of popliteal space [Baker], right knee: Secondary | ICD-10-CM | POA: Diagnosis not present

## 2017-01-11 DIAGNOSIS — M25461 Effusion, right knee: Secondary | ICD-10-CM | POA: Diagnosis present

## 2017-02-10 ENCOUNTER — Ambulatory Visit (HOSPITAL_BASED_OUTPATIENT_CLINIC_OR_DEPARTMENT_OTHER)
Admission: RE | Admit: 2017-02-10 | Discharge: 2017-02-10 | Disposition: A | Discharge status: Home / Self Care | Payer: Medicare Other | Source: Ambulatory Visit | Attending: Internal Medicine | Admitting: Internal Medicine

## 2017-02-10 DIAGNOSIS — I6523 Occlusion and stenosis of bilateral carotid arteries: Secondary | ICD-10-CM | POA: Diagnosis not present

## 2017-02-17 ENCOUNTER — Telehealth: Payer: Self-pay

## 2017-02-17 ENCOUNTER — Ambulatory Visit (HOSPITAL_BASED_OUTPATIENT_CLINIC_OR_DEPARTMENT_OTHER): Payer: Medicare Other | Admitting: Hematology and Oncology

## 2017-02-17 DIAGNOSIS — Z170 Estrogen receptor positive status [ER+]: Secondary | ICD-10-CM | POA: Diagnosis not present

## 2017-02-17 DIAGNOSIS — C50211 Malignant neoplasm of upper-inner quadrant of right female breast: Secondary | ICD-10-CM | POA: Diagnosis not present

## 2017-02-17 DIAGNOSIS — M8580 Other specified disorders of bone density and structure, unspecified site: Secondary | ICD-10-CM

## 2017-02-17 DIAGNOSIS — Z79811 Long term (current) use of aromatase inhibitors: Secondary | ICD-10-CM

## 2017-02-17 MED ORDER — ALENDRONATE SODIUM 70 MG PO TABS: 70.0000 mg | ORAL_TABLET | ORAL | 3 refills | Status: DC

## 2017-02-17 NOTE — Telephone Encounter (Signed)
Printed patient avs and calender per 10/1 los. Return 64mon.

## 2017-02-17 NOTE — Assessment & Plan Note (Addendum)
Right lumpectomy 02/14/2016: Invasive lobular cancer, grade 1, 1.8 cm, margins negative but close, 0/1 lymph node negative, T1 CN 0 stage IA, ER 95%, PR 95%, Ki-67 10%, HER-2 negative ratio 1.31 We did not order Oncotype DX testing because the patient is not interested in chemotherapy.  Treatment summary: 1. Adjuvant radiation therapy (at [facility]) completed 05/08/2016 2. Adjuvant antiestrogen therapy with anastrozole 1 mg daily since 05/20/2016  Anastrozole toxicities: Denies any myalgias. Does have occasional hot flashes.  Breast cancer surveillance: 1. Breast exam done 1 2018: Benign 2. Mammograms annually: Ordered  Return to clinic in 1 year for toxicity check and follow up.

## 2017-02-17 NOTE — Progress Notes (Signed)
Patient Care Team: Glenford Bayley, DO as PCP - General (Family Medicine) Excell Seltzer, MD as Consulting Physician (General Surgery) Nicholas Lose, MD as Consulting Physician (Hematology and Oncology) Kyung Rudd, MD as Consulting Physician (Radiation Oncology) Delice Bison Charlestine Massed, NP as Nurse Practitioner (Hematology and Oncology)  DIAGNOSIS:  Encounter Diagnosis  Name Primary?  . Malignant neoplasm of upper-inner quadrant of right breast in female, estrogen receptor positive ([place])     SUMMARY OF ONCOLOGIC HISTORY:   Breast cancer of upper-inner quadrant of right female breast ([place])   01/03/2016 Initial Diagnosis    Palpable right breast abnormality 1.6 cm at 2:00 position axilla negative ultrasound biopsy invasive lobular cancer grade 1, ER 95%, PR 95%, Ki-67 10%, HER-2 negative ratio 1.31, T1 cN0 stage IA clinical stage      02/14/2016 Surgery    Right lumpectomy: Invasive lobular cancer, grade 1, 1.8 cm, margins negative but close, 0/1 lymph node negative, T1 CN 0 stage IA, ER 95%, PR 95%, Ki-67 10%, HER-2 negative ratio 1.31      03/20/2016 - 05/08/2016 Radiation Therapy    Adjuvant radiation therapy at [facility]      05/20/2016 -  Anti-estrogen oral therapy    Anastrozole 1 mg by mouth daily       CHIEF COMPLIANT:  Recent left ankle fracture and a right knee meniscus injury  INTERVAL HISTORY: Elizabeth Cherry is a  61 year old with above-mentioned history of some right breast cancer currently on antiestrogen therapy with anastrozole. She has been tolerating anastrozole extremely well. Recently she fell on her porch and tore her meniscus and a few days later she slipped and broke her ankle. She is an ankle boot and hopes to get it out the next couple weeks.She was told that she has osteoporosis and the leg. She is here today to discuss what to do about antiestrogen therapy with anastrozole.  REVIEW OF SYSTEMS:   Constitutional: Denies fevers, chills or abnormal weight  loss Eyes: Denies blurriness of vision Ears, nose, mouth, throat, and face: Denies mucositis or sore throat Respiratory: Denies cough, dyspnea or wheezes Cardiovascular: Denies palpitation, chest discomfort Gastrointestinal:  Denies nausea, heartburn or change in bowel habits Skin: Denies abnormal skin rashes Lymphatics: Denies new lymphadenopathy or easy bruising Neurological:Denies numbness, tingling or new weaknesses Behavioral/Psych: Mood is stable, no new changes  Extremities: left ankle fracture All other systems were reviewed with the patient and are negative.  I have reviewed the past medical history, past surgical history, social history and family history with the patient and they are unchanged from previous note.  ALLERGIES:  is allergic to bactrim [sulfamethoxazole-trimethoprim]; diazepam; prozac [fluoxetine]; tetracycline; aspirin; cefdinir; nsaids; risperidone; telmisartan; trazodone; clarithromycin; and oxycodone.  MEDICATIONS:  Current Outpatient Prescriptions  Medication Sig Dispense Refill  . alendronate (FOSAMAX) 70 MG tablet Take 1 tablet (70 mg total) by mouth once a week. full glass of water on an empty stomach. 12 tablet 3  . Cholecalciferol (VITAMIN D3) 2000 units TABS Take 1 tablet by mouth daily.    Marland Kitchen acetaminophen (TYLENOL) 500 MG tablet Take 1,000 mg by mouth every 6 (six) hours as needed.    Marland Kitchen albuterol (PROVENTIL HFA;VENTOLIN HFA) 108 (90 Base) MCG/ACT inhaler Inhale 1-2 puffs into the lungs every 6 (six) hours as needed for wheezing or shortness of breath. 1 Inhaler 0  . anastrozole (ARIMIDEX) 1 MG tablet   2  . Biotin w/ Vitamins C & E (HAIR/SKIN/NAILS PO) Take by mouth.    Renard Hamper Cohosh  540 MG CAPS Take by mouth.    Marland Kitchen BREO ELLIPTA 200-25 MCG/INH AEPB   4  . Calcium Carbonate (CALTRATE 600 PO) Take by mouth.    . clonazePAM (KLONOPIN) 0.5 MG tablet Take 1 tablet (0.5 mg total) by mouth 3 (three) times daily as needed (anxiety or sleep). 90 tablet 0  .  Cyanocobalamin (VITAMIN B-12 PO) Take 5,000 mg by mouth.    . Fluticasone Furoate-Vilanterol (BREO ELLIPTA IN) Inhale into the lungs.    Marland Kitchen HYDROCHLOROTHIAZIDE PO Take by mouth.    . hydrOXYzine (ATARAX/VISTARIL) 50 MG tablet Take 1 tablet (50 mg total) by mouth at bedtime. 30 tablet 0  . ketoconazole (NIZORAL) 2 % cream Apply topically 2 (two) times daily. 15 g 0  . Montelukast Sodium (SINGULAIR PO) Take by mouth.    . OLANZapine (ZYPREXA) 15 MG tablet Take 1 tablet (15 mg total) by mouth at bedtime. 30 tablet 0  . pantoprazole (PROTONIX) 40 MG tablet Take 1 tablet (40 mg total) by mouth daily. 30 tablet 0  . pravastatin (PRAVACHOL) 40 MG tablet   1  . Probiotic Product (SOLUBLE FIBER/PROBIOTICS PO) Take by mouth.    . QUEtiapine (SEROQUEL) 50 MG tablet Take 1 tablet (50 mg total) by mouth at bedtime. 30 tablet 0  . sertraline (ZOLOFT) 100 MG tablet Take 1 tablet (100 mg total) by mouth daily with breakfast. 30 tablet 0  . traMADol (ULTRAM) 50 MG tablet Take by mouth every 6 (six) hours as needed.    Marland Kitchen VITAMIN E PO Take by mouth.     No current facility-administered medications for this visit.     PHYSICAL EXAMINATION: ECOG PERFORMANCE STATUS: 1 - Symptomatic but completely ambulatory  Vitals:   02/17/17 1404  BP: 107/85  Pulse: 99  Resp: 18  Temp: 98 F (36.7 C)  SpO2: 95%   Filed Weights   02/17/17 1404  Weight: 199 lb 1.6 oz (90.3 kg)    GENERAL:alert, no distress and comfortable SKIN: skin color, texture, turgor are normal, no rashes or significant lesions EYES: normal, Conjunctiva are pink and non-injected, sclera clear OROPHARYNX:no exudate, no erythema and lips, buccal mucosa, and tongue normal  NECK: supple, thyroid normal size, non-tender, without nodularity LYMPH:  no palpable lymphadenopathy in the cervical, axillary or inguinal LUNGS: clear to auscultation and percussion with normal breathing effort HEART: regular rate & rhythm and no murmurs and no lower  extremity edema ABDOMEN:abdomen soft, non-tender and normal bowel sounds MUSCULOSKELETAL:no cyanosis of digits and no clubbing  NEURO: alert & oriented x 3 with fluent speech, no focal motor/sensory deficits EXTREMITIES: No lower extremity edema  LABORATORY DATA:  I have reviewed the data as listed   Chemistry      Component Value Date/Time   NA 139 02/14/2016 1048   NA 143 01/10/2016 1233   K 3.4 (L) 02/14/2016 1048   K 3.8 01/10/2016 1233   CL 99 (L) 02/14/2016 1048   CO2 29 01/10/2016 1233   BUN 8 02/14/2016 1048   BUN 9.8 01/10/2016 1233   CREATININE 0.60 02/14/2016 1048   CREATININE 0.8 01/10/2016 1233      Component Value Date/Time   CALCIUM 9.9 01/10/2016 1233   ALKPHOS 98 01/10/2016 1233   AST 17 01/10/2016 1233   ALT 13 01/10/2016 1233   BILITOT <0.30 01/10/2016 1233       Lab Results  Component Value Date   WBC 9.2 01/10/2016   HGB 15.0 02/14/2016   HCT 44.0 02/14/2016  MCV 90.0 01/10/2016   PLT 293 01/10/2016   NEUTROABS 6.2 01/10/2016    ASSESSMENT & PLAN:  Breast cancer of upper-inner quadrant of right female breast ([place]) Right lumpectomy 02/14/2016: Invasive lobular cancer, grade 1, 1.8 cm, margins negative but close, 0/1 lymph node negative, T1 CN 0 stage IA, ER 95%, PR 95%, Ki-67 10%, HER-2 negative ratio 1.31 We did not order Oncotype DX testing because the patient is not interested in chemotherapy.  Treatment summary: 1. Adjuvant radiation therapy (at [facility]) completed 05/08/2016 2. Adjuvant antiestrogen therapy with anastrozole 1 mg daily since 05/20/2016  Anastrozole toxicities: Denies any myalgias. Does have occasional hot flashes.  Breast cancer surveillance: 1. Breast exam done 1 2018: Benign 2. Mammograms annually: Ordered   Left ankle fracture with osteopenia :  Her last bone density test at [facility] in [facility] on 12/21/2015 revealed a T score of -2.4.  I recommended that she start Fosamax along with her calcium and vitamin D. I  discussed the pros and cons of switching her from anastrozole to tamoxifen. After lengthy discussion regarding tamoxifen, we decided to keep her on anastrozole and initiate therapy with Fosamax  Return to clinic in  6 months for toxicity check and follow up.  I spent 25 minutes talking to the patient of which more than half was spent in counseling and coordination of care.  No orders of the defined types were placed in this encounter.  The patient has a good understanding of the overall plan. she agrees with it. she will call with any problems that may develop before the next visit here.   Rulon Eisenmenger, MD 02/17/17

## 2017-02-24 ENCOUNTER — Other Ambulatory Visit: Payer: Self-pay | Admitting: Hematology and Oncology

## 2017-05-27 DIAGNOSIS — Z86010 Personal history of colonic polyps: Secondary | ICD-10-CM | POA: Diagnosis not present

## 2017-05-27 DIAGNOSIS — D126 Benign neoplasm of colon, unspecified: Secondary | ICD-10-CM | POA: Diagnosis not present

## 2017-05-27 DIAGNOSIS — K5730 Diverticulosis of large intestine without perforation or abscess without bleeding: Secondary | ICD-10-CM | POA: Diagnosis not present

## 2017-05-29 DIAGNOSIS — M9903 Segmental and somatic dysfunction of lumbar region: Secondary | ICD-10-CM | POA: Diagnosis not present

## 2017-05-29 DIAGNOSIS — M9901 Segmental and somatic dysfunction of cervical region: Secondary | ICD-10-CM | POA: Diagnosis not present

## 2017-05-29 DIAGNOSIS — M5430 Sciatica, unspecified side: Secondary | ICD-10-CM | POA: Diagnosis not present

## 2017-05-29 DIAGNOSIS — D126 Benign neoplasm of colon, unspecified: Secondary | ICD-10-CM | POA: Diagnosis not present

## 2017-05-29 DIAGNOSIS — M9902 Segmental and somatic dysfunction of thoracic region: Secondary | ICD-10-CM | POA: Diagnosis not present

## 2017-06-05 DIAGNOSIS — C50911 Malignant neoplasm of unspecified site of right female breast: Secondary | ICD-10-CM | POA: Diagnosis not present

## 2017-06-06 ENCOUNTER — Other Ambulatory Visit: Payer: Self-pay | Admitting: Hematology and Oncology

## 2017-06-19 DIAGNOSIS — M5430 Sciatica, unspecified side: Secondary | ICD-10-CM | POA: Diagnosis not present

## 2017-06-19 DIAGNOSIS — M9903 Segmental and somatic dysfunction of lumbar region: Secondary | ICD-10-CM | POA: Diagnosis not present

## 2017-06-19 DIAGNOSIS — M9901 Segmental and somatic dysfunction of cervical region: Secondary | ICD-10-CM | POA: Diagnosis not present

## 2017-06-19 DIAGNOSIS — M9902 Segmental and somatic dysfunction of thoracic region: Secondary | ICD-10-CM | POA: Diagnosis not present

## 2017-06-30 DIAGNOSIS — R0609 Other forms of dyspnea: Secondary | ICD-10-CM | POA: Diagnosis not present

## 2017-06-30 DIAGNOSIS — E7800 Pure hypercholesterolemia, unspecified: Secondary | ICD-10-CM | POA: Diagnosis not present

## 2017-06-30 DIAGNOSIS — R0989 Other specified symptoms and signs involving the circulatory and respiratory systems: Secondary | ICD-10-CM | POA: Diagnosis not present

## 2017-06-30 DIAGNOSIS — J449 Chronic obstructive pulmonary disease, unspecified: Secondary | ICD-10-CM | POA: Diagnosis not present

## 2017-07-21 DIAGNOSIS — R05 Cough: Secondary | ICD-10-CM | POA: Diagnosis not present

## 2017-07-21 DIAGNOSIS — J449 Chronic obstructive pulmonary disease, unspecified: Secondary | ICD-10-CM | POA: Diagnosis not present

## 2017-07-21 DIAGNOSIS — F17200 Nicotine dependence, unspecified, uncomplicated: Secondary | ICD-10-CM | POA: Diagnosis not present

## 2017-07-23 DIAGNOSIS — E7800 Pure hypercholesterolemia, unspecified: Secondary | ICD-10-CM | POA: Diagnosis not present

## 2017-08-07 DIAGNOSIS — M5430 Sciatica, unspecified side: Secondary | ICD-10-CM | POA: Diagnosis not present

## 2017-08-07 DIAGNOSIS — M9901 Segmental and somatic dysfunction of cervical region: Secondary | ICD-10-CM | POA: Diagnosis not present

## 2017-08-07 DIAGNOSIS — M9902 Segmental and somatic dysfunction of thoracic region: Secondary | ICD-10-CM | POA: Diagnosis not present

## 2017-08-07 DIAGNOSIS — M9903 Segmental and somatic dysfunction of lumbar region: Secondary | ICD-10-CM | POA: Diagnosis not present

## 2017-08-18 ENCOUNTER — Ambulatory Visit: Payer: Self-pay | Admitting: Hematology and Oncology

## 2017-08-21 DIAGNOSIS — M545 Low back pain: Secondary | ICD-10-CM | POA: Diagnosis not present

## 2017-08-21 DIAGNOSIS — E782 Mixed hyperlipidemia: Secondary | ICD-10-CM | POA: Diagnosis not present

## 2017-08-21 DIAGNOSIS — I10 Essential (primary) hypertension: Secondary | ICD-10-CM | POA: Diagnosis not present

## 2017-08-21 DIAGNOSIS — N390 Urinary tract infection, site not specified: Secondary | ICD-10-CM | POA: Diagnosis not present

## 2017-08-21 DIAGNOSIS — J449 Chronic obstructive pulmonary disease, unspecified: Secondary | ICD-10-CM | POA: Diagnosis not present

## 2017-08-25 ENCOUNTER — Ambulatory Visit: Payer: Self-pay | Admitting: Hematology and Oncology

## 2017-08-25 NOTE — Assessment & Plan Note (Deleted)
Right lumpectomy 02/14/2016: Invasive lobular cancer, grade 1, 1.8 cm, margins negative but close, 0/1 lymph node negative, T1 CN 0 stage IA, ER 95%, PR 95%, Ki-67 10%, HER-2 negative ratio 1.31 We did not order Oncotype DX testing because the patient is not interested in chemotherapy.  Treatment summary: 1. Adjuvant radiation therapy (at [facility]) completed 05/08/2016 2. Adjuvant antiestrogen therapy with anastrozole 1 mg daily since 05/20/2016  Anastrozole toxicities: Denies any myalgias. Does have occasional hot flashes.  Breast cancer surveillance: 1. Breast exam done 08/25/2017: Benign 2. Mammograms annually: We do not have current mammograms  Left ankle fracture with severe osteopenia :  Her last bone density test at [facility] in [facility] on 12/21/2015 revealed a T score of -2.4: On Fosamax with calcium and vitamin D  Return to clinic in 1 year for follow-up

## 2017-09-11 DIAGNOSIS — M9903 Segmental and somatic dysfunction of lumbar region: Secondary | ICD-10-CM | POA: Diagnosis not present

## 2017-09-11 DIAGNOSIS — M5430 Sciatica, unspecified side: Secondary | ICD-10-CM | POA: Diagnosis not present

## 2017-09-11 DIAGNOSIS — M9901 Segmental and somatic dysfunction of cervical region: Secondary | ICD-10-CM | POA: Diagnosis not present

## 2017-09-11 DIAGNOSIS — M9902 Segmental and somatic dysfunction of thoracic region: Secondary | ICD-10-CM | POA: Diagnosis not present

## 2017-09-23 DIAGNOSIS — R0989 Other specified symptoms and signs involving the circulatory and respiratory systems: Secondary | ICD-10-CM | POA: Diagnosis not present

## 2017-09-24 ENCOUNTER — Inpatient Hospital Stay: Payer: Medicare HMO | Attending: Hematology and Oncology | Admitting: Hematology and Oncology

## 2017-09-24 ENCOUNTER — Other Ambulatory Visit: Payer: Self-pay | Admitting: Hematology and Oncology

## 2017-09-24 ENCOUNTER — Telehealth: Payer: Self-pay | Admitting: Hematology and Oncology

## 2017-09-24 DIAGNOSIS — Z79899 Other long term (current) drug therapy: Secondary | ICD-10-CM | POA: Insufficient documentation

## 2017-09-24 DIAGNOSIS — M859 Disorder of bone density and structure, unspecified: Secondary | ICD-10-CM | POA: Diagnosis not present

## 2017-09-24 DIAGNOSIS — Z79811 Long term (current) use of aromatase inhibitors: Secondary | ICD-10-CM | POA: Diagnosis not present

## 2017-09-24 DIAGNOSIS — C50211 Malignant neoplasm of upper-inner quadrant of right female breast: Secondary | ICD-10-CM | POA: Diagnosis not present

## 2017-09-24 DIAGNOSIS — Z923 Personal history of irradiation: Secondary | ICD-10-CM | POA: Insufficient documentation

## 2017-09-24 DIAGNOSIS — Z170 Estrogen receptor positive status [ER+]: Secondary | ICD-10-CM | POA: Diagnosis not present

## 2017-09-24 MED ORDER — ATORVASTATIN CALCIUM 20 MG PO TABS: 20.0000 mg | ORAL_TABLET | Freq: Every day | ORAL | Status: DC

## 2017-09-24 MED ORDER — TIOTROPIUM BROMIDE MONOHYDRATE 18 MCG IN CAPS: 18.0000 ug | ORAL_CAPSULE | Freq: Every day | RESPIRATORY_TRACT | 12 refills | Status: DC

## 2017-09-24 MED ORDER — ALENDRONATE SODIUM 70 MG PO TABS: 70.0000 mg | ORAL_TABLET | ORAL | 3 refills | Status: DC

## 2017-09-24 MED ORDER — ANASTROZOLE 1 MG PO TABS: ORAL_TABLET | ORAL | 3 refills | Status: DC

## 2017-09-24 NOTE — Progress Notes (Signed)
Patient Care Team: Glenford Bayley, DO as PCP - General (Family Medicine) Excell Seltzer, MD as Consulting Physician (General Surgery) Nicholas Lose, MD as Consulting Physician (Hematology and Oncology) Kyung Rudd, MD as Consulting Physician (Radiation Oncology) Delice Bison Charlestine Massed, NP as Nurse Practitioner (Hematology and Oncology)  DIAGNOSIS:  Encounter Diagnosis  Name Primary?  . Malignant neoplasm of upper-inner quadrant of right breast in female, estrogen receptor positive ([place])     SUMMARY OF ONCOLOGIC HISTORY:   Breast cancer of upper-inner quadrant of right female breast ([place])   01/03/2016 Initial Diagnosis    Palpable right breast abnormality 1.6 cm at 2:00 position axilla negative ultrasound biopsy invasive lobular cancer grade 1, ER 95%, PR 95%, Ki-67 10%, HER-2 negative ratio 1.31, T1 cN0 stage IA clinical stage      02/14/2016 Surgery    Right lumpectomy: Invasive lobular cancer, grade 1, 1.8 cm, margins negative but close, 0/1 lymph node negative, T1 CN 0 stage IA, ER 95%, PR 95%, Ki-67 10%, HER-2 negative ratio 1.31      03/20/2016 - 05/08/2016 Radiation Therapy    Adjuvant radiation therapy at [facility]      05/20/2016 -  Anti-estrogen oral therapy    Anastrozole 1 mg by mouth daily       CHIEF COMPLIANT: Follow-up on anastrozole therapy  INTERVAL HISTORY: Elizabeth Cherry is a 62 year old with above-mentioned history of right breast cancer treated with lumpectomy radiation is currently on anastrozole therapy.  She is tolerating anastrozole fairly well.  She does have hot flashes intermittently.  She does have tenderness in the breast but denies any lumps or nodules.  REVIEW OF SYSTEMS:   Constitutional: Denies fevers, chills or abnormal weight loss Eyes: Denies blurriness of vision Ears, nose, mouth, throat, and face: Denies mucositis or sore throat Respiratory: Denies cough, dyspnea or wheezes Cardiovascular: Denies palpitation, chest  discomfort Gastrointestinal:  Denies nausea, heartburn or change in bowel habits Skin: Denies abnormal skin rashes Lymphatics: Denies new lymphadenopathy or easy bruising Neurological:Denies numbness, tingling or new weaknesses Behavioral/Psych: Mood is stable, no new changes  Extremities: No lower extremity edema Breast:  denies any pain or lumps or nodules in either breasts All other systems were reviewed with the patient and are negative.  I have reviewed the past medical history, past surgical history, social history and family history with the patient and they are unchanged from previous note.  ALLERGIES:  is allergic to bactrim [sulfamethoxazole-trimethoprim]; diazepam; prozac [fluoxetine]; tetracycline; aspirin; cefdinir; nsaids; risperidone; telmisartan; trazodone; clarithromycin; and oxycodone.  MEDICATIONS:  Current Outpatient Medications  Medication Sig Dispense Refill  . acetaminophen (TYLENOL) 500 MG tablet Take 1,000 mg by mouth every 6 (six) hours as needed.    Marland Kitchen albuterol (PROVENTIL HFA;VENTOLIN HFA) 108 (90 Base) MCG/ACT inhaler Inhale 1-2 puffs into the lungs every 6 (six) hours as needed for wheezing or shortness of breath. 1 Inhaler 0  . alendronate (FOSAMAX) 70 MG tablet Take 1 tablet (70 mg total) by mouth once a week. full glass of water on an empty stomach. 12 tablet 3  . anastrozole (ARIMIDEX) 1 MG tablet TAKE (1) TABLET BY MOUTH ONCE DAILY. 90 tablet 3  . atorvastatin (LIPITOR) 20 MG tablet Take 1 tablet (20 mg total) by mouth daily.    . Biotin w/ Vitamins C & E (HAIR/SKIN/NAILS PO) Take by mouth.    Marland Kitchen BREO ELLIPTA 200-25 MCG/INH AEPB   4  . Calcium Carbonate (CALTRATE 600 PO) Take by mouth.    . Cholecalciferol (VITAMIN  D3) 2000 units TABS Take 1 tablet by mouth daily.    . clonazePAM (KLONOPIN) 0.5 MG tablet Take 1 tablet (0.5 mg total) by mouth 3 (three) times daily as needed (anxiety or sleep). 90 tablet 0  . Cyanocobalamin (VITAMIN B-12 PO) Take 5,000 mg by  mouth.    . Fluticasone Furoate-Vilanterol (BREO ELLIPTA IN) Inhale into the lungs.    Marland Kitchen HYDROCHLOROTHIAZIDE PO Take by mouth.    . hydrOXYzine (ATARAX/VISTARIL) 50 MG tablet Take 1 tablet (50 mg total) by mouth at bedtime. 30 tablet 0  . Montelukast Sodium (SINGULAIR PO) Take by mouth.    . OLANZapine (ZYPREXA) 15 MG tablet Take 1 tablet (15 mg total) by mouth at bedtime. 30 tablet 0  . pantoprazole (PROTONIX) 40 MG tablet Take 1 tablet (40 mg total) by mouth daily. 30 tablet 0  . Probiotic Product (SOLUBLE FIBER/PROBIOTICS PO) Take by mouth.    . QUEtiapine (SEROQUEL) 50 MG tablet Take 1 tablet (50 mg total) by mouth at bedtime. 30 tablet 0  . sertraline (ZOLOFT) 100 MG tablet Take 1 tablet (100 mg total) by mouth daily with breakfast. 30 tablet 0  . tiotropium (SPIRIVA HANDIHALER) 18 MCG inhalation capsule Place 1 capsule (18 mcg total) into inhaler and inhale daily. 30 capsule 12  . traMADol (ULTRAM) 50 MG tablet Take by mouth every 6 (six) hours as needed.    Marland Kitchen VITAMIN E PO Take by mouth.     No current facility-administered medications for this visit.     PHYSICAL EXAMINATION: ECOG PERFORMANCE STATUS: 1 - Symptomatic but completely ambulatory  Vitals:   09/24/17 1447  BP: (!) 106/93  Pulse: (!) 102  Resp: 19  Temp: 98.6 F (37 C)  SpO2: 92%   Filed Weights   09/24/17 1447  Weight: 210 lb 1.6 oz (95.3 kg)    GENERAL:alert, no distress and comfortable SKIN: skin color, texture, turgor are normal, no rashes or significant lesions EYES: normal, Conjunctiva are pink and non-injected, sclera clear OROPHARYNX:no exudate, no erythema and lips, buccal mucosa, and tongue normal  NECK: supple, thyroid normal size, non-tender, without nodularity LYMPH:  no palpable lymphadenopathy in the cervical, axillary or inguinal LUNGS: clear to auscultation and percussion with normal breathing effort HEART: regular rate & rhythm and no murmurs and no lower extremity edema ABDOMEN:abdomen  soft, non-tender and normal bowel sounds MUSCULOSKELETAL:no cyanosis of digits and no clubbing  NEURO: alert & oriented x 3 with fluent speech, no focal motor/sensory deficits EXTREMITIES: No lower extremity edema BREAST: No palpable masses or nodules in either right or left breasts.  Tenderness in the breast.  No palpable axillary supraclavicular or infraclavicular adenopathy no breast tenderness or nipple discharge. (exam performed in the presence of a chaperone)  LABORATORY DATA:  I have reviewed the data as listed CMP Latest Ref Rng & Units 02/14/2016 01/10/2016 10/23/2015  Glucose 65 - 99 mg/dL 83 111 132(H)  BUN 6 - 20 mg/dL 8 9.8 10  Creatinine 0.44 - 1.00 mg/dL 0.60 0.8 0.80  Sodium 135 - 145 mmol/L 139 143 135  Potassium 3.5 - 5.1 mmol/L 3.4(L) 3.8 3.1(L)  Chloride 101 - 111 mmol/L 99(L) - 104  CO2 22 - 29 mEq/L - 29 22  Calcium 8.4 - 10.4 mg/dL - 9.9 9.4  Total Protein 6.4 - 8.3 g/dL - 7.1 7.0  Total Bilirubin 0.20 - 1.20 mg/dL - <0.30 1.2  Alkaline Phos 40 - 150 U/L - 98 63  AST 5 - 34 U/L -  17 16  ALT 0 - 55 U/L - 13 9(L)    Lab Results  Component Value Date   WBC 9.2 01/10/2016   HGB 15.0 02/14/2016   HCT 44.0 02/14/2016   MCV 90.0 01/10/2016   PLT 293 01/10/2016   NEUTROABS 6.2 01/10/2016    ASSESSMENT & PLAN:  Breast cancer of upper-inner quadrant of right female breast ([place]) Right lumpectomy 02/14/2016: Invasive lobular cancer, grade 1, 1.8 cm, margins negative but close, 0/1 lymph node negative, T1 CN 0 stage IA, ER 95%, PR 95%, Ki-67 10%, HER-2 negative ratio 1.31 We did not order Oncotype DX testing because the patient is not interested in chemotherapy.  Treatment summary: 1. Adjuvant radiation therapy (at [facility]) completed 05/08/2016 2. Adjuvant antiestrogen therapy with anastrozole 1 mg daily since 05/20/2016  Anastrozole toxicities: Denies any myalgias. Does have occasional hot flashes.  Breast cancer surveillance: 1. Breast exam done 09/24/2017:  Benign 2. Mammograms September 2018 at [facility] benign  Left ankle fracture with severe osteopenia :  Her last bone density test at [facility] in [place] on 12/21/2015 revealed a T score of -2.4: On Fosamax with calcium and vitamin D  I sent a refill for Fosamax and anastrozole Return to clinic in 1 year for follow-up      No orders of the defined types were placed in this encounter.  The patient has a good understanding of the overall plan. she agrees with it. she will call with any problems that may develop before the next visit here.   Harriette Ohara, MD 09/24/17

## 2017-09-24 NOTE — Assessment & Plan Note (Addendum)
Right lumpectomy 02/14/2016: Invasive lobular cancer, grade 1, 1.8 cm, margins negative but close, 0/1 lymph node negative, T1 CN 0 stage IA, ER 95%, PR 95%, Ki-67 10%, HER-2 negative ratio 1.31 We did not order Oncotype DX testing because the patient is not interested in chemotherapy.  Treatment summary: 1. Adjuvant radiation therapy (at [facility]) completed 05/08/2016 2. Adjuvant antiestrogen therapy with anastrozole 1 mg daily since 05/20/2016  Anastrozole toxicities: Denies any myalgias. Does have occasional hot flashes.  Breast cancer surveillance: 1. Breast exam done 09/24/2017: Benign 2. Mammograms September 2018 at [facility] benign  Left ankle fracture with severe osteopenia :  Her last bone density test at [facility] in [facility] on 12/21/2015 revealed a T score of -2.4: On Fosamax with calcium and vitamin D  I sent a refill for Fosamax and anastrozole Return to clinic in 1 year for follow-up

## 2017-09-24 NOTE — Telephone Encounter (Signed)
Gave patient AVs and calendar of upcoming may 2020 appointments °

## 2017-09-25 DIAGNOSIS — M9903 Segmental and somatic dysfunction of lumbar region: Secondary | ICD-10-CM | POA: Diagnosis not present

## 2017-09-25 DIAGNOSIS — M9902 Segmental and somatic dysfunction of thoracic region: Secondary | ICD-10-CM | POA: Diagnosis not present

## 2017-09-25 DIAGNOSIS — M5430 Sciatica, unspecified side: Secondary | ICD-10-CM | POA: Diagnosis not present

## 2017-09-25 DIAGNOSIS — M9901 Segmental and somatic dysfunction of cervical region: Secondary | ICD-10-CM | POA: Diagnosis not present

## 2017-09-30 DIAGNOSIS — B078 Other viral warts: Secondary | ICD-10-CM | POA: Diagnosis not present

## 2017-09-30 DIAGNOSIS — L728 Other follicular cysts of the skin and subcutaneous tissue: Secondary | ICD-10-CM | POA: Diagnosis not present

## 2017-09-30 DIAGNOSIS — Z1283 Encounter for screening for malignant neoplasm of skin: Secondary | ICD-10-CM | POA: Diagnosis not present

## 2017-10-09 DIAGNOSIS — M9903 Segmental and somatic dysfunction of lumbar region: Secondary | ICD-10-CM | POA: Diagnosis not present

## 2017-10-09 DIAGNOSIS — M5430 Sciatica, unspecified side: Secondary | ICD-10-CM | POA: Diagnosis not present

## 2017-10-09 DIAGNOSIS — M9901 Segmental and somatic dysfunction of cervical region: Secondary | ICD-10-CM | POA: Diagnosis not present

## 2017-10-09 DIAGNOSIS — M9902 Segmental and somatic dysfunction of thoracic region: Secondary | ICD-10-CM | POA: Diagnosis not present

## 2017-10-21 DIAGNOSIS — G720 Drug-induced myopathy: Secondary | ICD-10-CM | POA: Diagnosis not present

## 2017-10-21 DIAGNOSIS — M25571 Pain in right ankle and joints of right foot: Secondary | ICD-10-CM | POA: Diagnosis not present

## 2017-10-21 DIAGNOSIS — M8588 Other specified disorders of bone density and structure, other site: Secondary | ICD-10-CM | POA: Diagnosis not present

## 2017-10-21 DIAGNOSIS — L700 Acne vulgaris: Secondary | ICD-10-CM | POA: Diagnosis not present

## 2017-10-21 DIAGNOSIS — I10 Essential (primary) hypertension: Secondary | ICD-10-CM | POA: Diagnosis not present

## 2017-10-21 DIAGNOSIS — E782 Mixed hyperlipidemia: Secondary | ICD-10-CM | POA: Diagnosis not present

## 2017-10-23 DIAGNOSIS — M9902 Segmental and somatic dysfunction of thoracic region: Secondary | ICD-10-CM | POA: Diagnosis not present

## 2017-10-23 DIAGNOSIS — M9901 Segmental and somatic dysfunction of cervical region: Secondary | ICD-10-CM | POA: Diagnosis not present

## 2017-10-23 DIAGNOSIS — M5430 Sciatica, unspecified side: Secondary | ICD-10-CM | POA: Diagnosis not present

## 2017-10-23 DIAGNOSIS — M9903 Segmental and somatic dysfunction of lumbar region: Secondary | ICD-10-CM | POA: Diagnosis not present

## 2017-11-06 DIAGNOSIS — M9901 Segmental and somatic dysfunction of cervical region: Secondary | ICD-10-CM | POA: Diagnosis not present

## 2017-11-06 DIAGNOSIS — M5430 Sciatica, unspecified side: Secondary | ICD-10-CM | POA: Diagnosis not present

## 2017-11-06 DIAGNOSIS — M9902 Segmental and somatic dysfunction of thoracic region: Secondary | ICD-10-CM | POA: Diagnosis not present

## 2017-11-06 DIAGNOSIS — M9903 Segmental and somatic dysfunction of lumbar region: Secondary | ICD-10-CM | POA: Diagnosis not present

## 2017-11-13 DIAGNOSIS — R1030 Lower abdominal pain, unspecified: Secondary | ICD-10-CM | POA: Diagnosis not present

## 2017-11-13 DIAGNOSIS — R197 Diarrhea, unspecified: Secondary | ICD-10-CM | POA: Diagnosis not present

## 2017-11-14 ENCOUNTER — Other Ambulatory Visit: Payer: Self-pay | Admitting: Family Medicine

## 2017-11-14 DIAGNOSIS — R1030 Lower abdominal pain, unspecified: Secondary | ICD-10-CM

## 2017-11-15 ENCOUNTER — Ambulatory Visit (HOSPITAL_BASED_OUTPATIENT_CLINIC_OR_DEPARTMENT_OTHER): Payer: Medicare HMO

## 2017-11-18 DIAGNOSIS — M5430 Sciatica, unspecified side: Secondary | ICD-10-CM | POA: Diagnosis not present

## 2017-11-18 DIAGNOSIS — M9902 Segmental and somatic dysfunction of thoracic region: Secondary | ICD-10-CM | POA: Diagnosis not present

## 2017-11-18 DIAGNOSIS — M9901 Segmental and somatic dysfunction of cervical region: Secondary | ICD-10-CM | POA: Diagnosis not present

## 2017-11-18 DIAGNOSIS — M9903 Segmental and somatic dysfunction of lumbar region: Secondary | ICD-10-CM | POA: Diagnosis not present

## 2017-11-26 DIAGNOSIS — M8589 Other specified disorders of bone density and structure, multiple sites: Secondary | ICD-10-CM | POA: Diagnosis not present

## 2017-11-28 DIAGNOSIS — L03213 Periorbital cellulitis: Secondary | ICD-10-CM | POA: Diagnosis not present

## 2017-12-04 DIAGNOSIS — M5430 Sciatica, unspecified side: Secondary | ICD-10-CM | POA: Diagnosis not present

## 2017-12-04 DIAGNOSIS — M9902 Segmental and somatic dysfunction of thoracic region: Secondary | ICD-10-CM | POA: Diagnosis not present

## 2017-12-04 DIAGNOSIS — M9901 Segmental and somatic dysfunction of cervical region: Secondary | ICD-10-CM | POA: Diagnosis not present

## 2017-12-04 DIAGNOSIS — M9903 Segmental and somatic dysfunction of lumbar region: Secondary | ICD-10-CM | POA: Diagnosis not present

## 2017-12-09 DIAGNOSIS — I6523 Occlusion and stenosis of bilateral carotid arteries: Secondary | ICD-10-CM | POA: Diagnosis not present

## 2017-12-09 DIAGNOSIS — B3781 Candidal esophagitis: Secondary | ICD-10-CM | POA: Diagnosis not present

## 2017-12-09 DIAGNOSIS — E782 Mixed hyperlipidemia: Secondary | ICD-10-CM | POA: Diagnosis not present

## 2017-12-09 DIAGNOSIS — B370 Candidal stomatitis: Secondary | ICD-10-CM | POA: Diagnosis not present

## 2017-12-09 DIAGNOSIS — I10 Essential (primary) hypertension: Secondary | ICD-10-CM | POA: Diagnosis not present

## 2017-12-18 DIAGNOSIS — M9901 Segmental and somatic dysfunction of cervical region: Secondary | ICD-10-CM | POA: Diagnosis not present

## 2017-12-18 DIAGNOSIS — M9903 Segmental and somatic dysfunction of lumbar region: Secondary | ICD-10-CM | POA: Diagnosis not present

## 2017-12-18 DIAGNOSIS — M5430 Sciatica, unspecified side: Secondary | ICD-10-CM | POA: Diagnosis not present

## 2017-12-18 DIAGNOSIS — M9902 Segmental and somatic dysfunction of thoracic region: Secondary | ICD-10-CM | POA: Diagnosis not present

## 2017-12-25 DIAGNOSIS — R197 Diarrhea, unspecified: Secondary | ICD-10-CM | POA: Diagnosis not present

## 2017-12-25 DIAGNOSIS — R1030 Lower abdominal pain, unspecified: Secondary | ICD-10-CM | POA: Diagnosis not present

## 2018-01-08 DIAGNOSIS — E876 Hypokalemia: Secondary | ICD-10-CM | POA: Diagnosis not present

## 2018-01-08 DIAGNOSIS — A0472 Enterocolitis due to Clostridium difficile, not specified as recurrent: Secondary | ICD-10-CM | POA: Diagnosis not present

## 2018-01-08 DIAGNOSIS — R1013 Epigastric pain: Secondary | ICD-10-CM | POA: Diagnosis not present

## 2018-01-15 DIAGNOSIS — M5430 Sciatica, unspecified side: Secondary | ICD-10-CM | POA: Diagnosis not present

## 2018-01-15 DIAGNOSIS — M9903 Segmental and somatic dysfunction of lumbar region: Secondary | ICD-10-CM | POA: Diagnosis not present

## 2018-01-15 DIAGNOSIS — M9901 Segmental and somatic dysfunction of cervical region: Secondary | ICD-10-CM | POA: Diagnosis not present

## 2018-01-15 DIAGNOSIS — M9902 Segmental and somatic dysfunction of thoracic region: Secondary | ICD-10-CM | POA: Diagnosis not present

## 2018-02-04 DIAGNOSIS — A0472 Enterocolitis due to Clostridium difficile, not specified as recurrent: Secondary | ICD-10-CM | POA: Diagnosis not present

## 2018-02-04 DIAGNOSIS — K2950 Unspecified chronic gastritis without bleeding: Secondary | ICD-10-CM | POA: Diagnosis not present

## 2018-02-04 DIAGNOSIS — K589 Irritable bowel syndrome without diarrhea: Secondary | ICD-10-CM | POA: Diagnosis not present

## 2018-02-04 DIAGNOSIS — J449 Chronic obstructive pulmonary disease, unspecified: Secondary | ICD-10-CM | POA: Diagnosis not present

## 2018-02-04 DIAGNOSIS — E876 Hypokalemia: Secondary | ICD-10-CM | POA: Diagnosis not present

## 2018-02-04 DIAGNOSIS — R143 Flatulence: Secondary | ICD-10-CM | POA: Diagnosis not present

## 2018-02-04 DIAGNOSIS — E7800 Pure hypercholesterolemia, unspecified: Secondary | ICD-10-CM | POA: Diagnosis not present

## 2018-02-04 DIAGNOSIS — K219 Gastro-esophageal reflux disease without esophagitis: Secondary | ICD-10-CM | POA: Diagnosis not present

## 2018-02-04 DIAGNOSIS — I10 Essential (primary) hypertension: Secondary | ICD-10-CM | POA: Diagnosis not present

## 2018-02-05 DIAGNOSIS — M5430 Sciatica, unspecified side: Secondary | ICD-10-CM | POA: Diagnosis not present

## 2018-02-05 DIAGNOSIS — M9903 Segmental and somatic dysfunction of lumbar region: Secondary | ICD-10-CM | POA: Diagnosis not present

## 2018-02-05 DIAGNOSIS — M9902 Segmental and somatic dysfunction of thoracic region: Secondary | ICD-10-CM | POA: Diagnosis not present

## 2018-02-05 DIAGNOSIS — M9901 Segmental and somatic dysfunction of cervical region: Secondary | ICD-10-CM | POA: Diagnosis not present

## 2018-02-16 DIAGNOSIS — B351 Tinea unguium: Secondary | ICD-10-CM | POA: Diagnosis not present

## 2018-02-16 DIAGNOSIS — B078 Other viral warts: Secondary | ICD-10-CM | POA: Diagnosis not present

## 2018-02-17 DIAGNOSIS — R1013 Epigastric pain: Secondary | ICD-10-CM | POA: Diagnosis not present

## 2018-02-17 DIAGNOSIS — Z86010 Personal history of colonic polyps: Secondary | ICD-10-CM | POA: Diagnosis not present

## 2018-02-23 DIAGNOSIS — M9901 Segmental and somatic dysfunction of cervical region: Secondary | ICD-10-CM | POA: Diagnosis not present

## 2018-02-23 DIAGNOSIS — M9902 Segmental and somatic dysfunction of thoracic region: Secondary | ICD-10-CM | POA: Diagnosis not present

## 2018-02-23 DIAGNOSIS — M5430 Sciatica, unspecified side: Secondary | ICD-10-CM | POA: Diagnosis not present

## 2018-02-23 DIAGNOSIS — M9903 Segmental and somatic dysfunction of lumbar region: Secondary | ICD-10-CM | POA: Diagnosis not present

## 2018-02-24 DIAGNOSIS — H40033 Anatomical narrow angle, bilateral: Secondary | ICD-10-CM | POA: Diagnosis not present

## 2018-02-24 DIAGNOSIS — H04123 Dry eye syndrome of bilateral lacrimal glands: Secondary | ICD-10-CM | POA: Diagnosis not present

## 2018-02-26 ENCOUNTER — Encounter: Payer: Self-pay | Admitting: Hematology and Oncology

## 2018-02-26 DIAGNOSIS — R921 Mammographic calcification found on diagnostic imaging of breast: Secondary | ICD-10-CM | POA: Diagnosis not present

## 2018-02-26 DIAGNOSIS — Z853 Personal history of malignant neoplasm of breast: Secondary | ICD-10-CM | POA: Diagnosis not present

## 2018-03-09 DIAGNOSIS — M9901 Segmental and somatic dysfunction of cervical region: Secondary | ICD-10-CM | POA: Diagnosis not present

## 2018-03-09 DIAGNOSIS — M9903 Segmental and somatic dysfunction of lumbar region: Secondary | ICD-10-CM | POA: Diagnosis not present

## 2018-03-09 DIAGNOSIS — M9902 Segmental and somatic dysfunction of thoracic region: Secondary | ICD-10-CM | POA: Diagnosis not present

## 2018-03-09 DIAGNOSIS — M5430 Sciatica, unspecified side: Secondary | ICD-10-CM | POA: Diagnosis not present

## 2018-03-10 DIAGNOSIS — M797 Fibromyalgia: Secondary | ICD-10-CM | POA: Diagnosis not present

## 2018-03-10 DIAGNOSIS — Z124 Encounter for screening for malignant neoplasm of cervix: Secondary | ICD-10-CM | POA: Diagnosis not present

## 2018-03-10 DIAGNOSIS — E782 Mixed hyperlipidemia: Secondary | ICD-10-CM | POA: Diagnosis not present

## 2018-03-10 DIAGNOSIS — C50919 Malignant neoplasm of unspecified site of unspecified female breast: Secondary | ICD-10-CM | POA: Diagnosis not present

## 2018-03-10 DIAGNOSIS — I10 Essential (primary) hypertension: Secondary | ICD-10-CM | POA: Diagnosis not present

## 2018-03-10 DIAGNOSIS — Z0000 Encounter for general adult medical examination without abnormal findings: Secondary | ICD-10-CM | POA: Diagnosis not present

## 2018-03-10 DIAGNOSIS — Z23 Encounter for immunization: Secondary | ICD-10-CM | POA: Diagnosis not present

## 2018-03-10 DIAGNOSIS — J449 Chronic obstructive pulmonary disease, unspecified: Secondary | ICD-10-CM | POA: Diagnosis not present

## 2018-03-10 DIAGNOSIS — K219 Gastro-esophageal reflux disease without esophagitis: Secondary | ICD-10-CM | POA: Diagnosis not present

## 2018-03-10 DIAGNOSIS — F411 Generalized anxiety disorder: Secondary | ICD-10-CM | POA: Diagnosis not present

## 2018-03-10 DIAGNOSIS — Z7189 Other specified counseling: Secondary | ICD-10-CM | POA: Diagnosis not present

## 2018-03-11 ENCOUNTER — Other Ambulatory Visit: Payer: Self-pay | Admitting: Radiology

## 2018-03-11 DIAGNOSIS — N641 Fat necrosis of breast: Secondary | ICD-10-CM | POA: Diagnosis not present

## 2018-03-11 DIAGNOSIS — R921 Mammographic calcification found on diagnostic imaging of breast: Secondary | ICD-10-CM | POA: Diagnosis not present

## 2018-03-11 DIAGNOSIS — Z01818 Encounter for other preprocedural examination: Secondary | ICD-10-CM | POA: Diagnosis not present

## 2018-03-25 DIAGNOSIS — B078 Other viral warts: Secondary | ICD-10-CM | POA: Diagnosis not present

## 2018-03-26 DIAGNOSIS — M5430 Sciatica, unspecified side: Secondary | ICD-10-CM | POA: Diagnosis not present

## 2018-03-26 DIAGNOSIS — M9901 Segmental and somatic dysfunction of cervical region: Secondary | ICD-10-CM | POA: Diagnosis not present

## 2018-03-26 DIAGNOSIS — M9903 Segmental and somatic dysfunction of lumbar region: Secondary | ICD-10-CM | POA: Diagnosis not present

## 2018-03-26 DIAGNOSIS — M9902 Segmental and somatic dysfunction of thoracic region: Secondary | ICD-10-CM | POA: Diagnosis not present

## 2018-04-14 DIAGNOSIS — Z720 Tobacco use: Secondary | ICD-10-CM | POA: Diagnosis not present

## 2018-04-14 DIAGNOSIS — J441 Chronic obstructive pulmonary disease with (acute) exacerbation: Secondary | ICD-10-CM | POA: Diagnosis not present

## 2018-04-14 DIAGNOSIS — J069 Acute upper respiratory infection, unspecified: Secondary | ICD-10-CM | POA: Diagnosis not present

## 2018-04-15 DIAGNOSIS — M9901 Segmental and somatic dysfunction of cervical region: Secondary | ICD-10-CM | POA: Diagnosis not present

## 2018-04-15 DIAGNOSIS — M5430 Sciatica, unspecified side: Secondary | ICD-10-CM | POA: Diagnosis not present

## 2018-04-15 DIAGNOSIS — M9902 Segmental and somatic dysfunction of thoracic region: Secondary | ICD-10-CM | POA: Diagnosis not present

## 2018-04-15 DIAGNOSIS — M9903 Segmental and somatic dysfunction of lumbar region: Secondary | ICD-10-CM | POA: Diagnosis not present

## 2018-04-20 ENCOUNTER — Ambulatory Visit: Payer: Self-pay | Admitting: Cardiovascular Disease

## 2018-04-26 ENCOUNTER — Encounter: Payer: Self-pay | Admitting: Cardiovascular Disease

## 2018-04-26 NOTE — Progress Notes (Deleted)
Cardiology Office Note:    Date:  04/26/2018   ID:  Elizabeth Cherry, DOB 08/19/1955, MRN [id number]  PCP:  Glenford Bayley, DO  Cardiologist:  Verlee Pope  Electrophysiologist:  None   Referring MD: Glenford Bayley, DO   Chief Complaint  Patient presents with  . Hypertension  ***  History of Present Illness:    Elizabeth Cherry is a 62 y.o. female with a hx of ***  Past Medical History:  Diagnosis Date  . Asthma   . Benign neoplasm of colon 10/24/2015  . Borderline personality disorder ([employer])   . Breast cancer of upper-inner quadrant of right female breast ([place]) 01/08/2016  . Chronic obstructive pulmonary disease ([place]) 11/15/2011  . Chronic pain 10/24/2011   Overview:  Managed by [facility]. Gets Fentanyl Patches - sees them every other month.   Marland Kitchen COPD (chronic obstructive pulmonary disease) ([place])   . Depression   . Edema    due to psychiatric meds per patient, on a diuretic  . Fibromyalgia   . Generalized anxiety disorder   . Hypertension    no meds now  . Multiple gastric ulcers   . Psoriasis 02/11/2012  . Sciatica   . Self neglect 10/23/2015  . Severe recurrent major depression without psychotic features ([place]) 10/23/2015    Past Surgical History:  Procedure Laterality Date  . BREAST LUMPECTOMY WITH RADIOACTIVE SEED AND SENTINEL LYMPH NODE BIOPSY Right 02/14/2016   Procedure: BREAST LUMPECTOMY WITH RADIOACTIVE SEED AND SENTINEL LYMPH NODE BIOPSY;  Surgeon: Excell Seltzer, MD;  Location: [facility];  Service: General;  Laterality: Right;  . DIAGNOSTIC LAPAROSCOPY    . DILATION AND CURETTAGE OF UTERUS    . GYNECOLOGIC CRYOSURGERY      Current Medications: No outpatient medications have been marked as taking for the 04/27/18 encounter (Office Visit) with [name], Wonda Cheng, MD.     Allergies:   Bactrim [sulfamethoxazole-trimethoprim]; Diazepam; Prozac [fluoxetine]; Tetracycline; Aspirin; Cefdinir; Nsaids; Risperidone; Telmisartan; Trazodone;  Clarithromycin; and Oxycodone   Social History   Socioeconomic History  . Marital status: Single    Spouse name: Not on file  . Number of children: Not on file  . Years of education: Not on file  . Highest education level: Not on file  Occupational History  . Not on file  Social Needs  . Financial resource strain: Not on file  . Food insecurity:    Worry: Not on file    Inability: Not on file  . Transportation needs:    Medical: Not on file    Non-medical: Not on file  Tobacco Use  . Smoking status: Current Every Day Smoker    Packs/day: 1.50    Types: Cigarettes  . Smokeless tobacco: Never Used  Substance and Sexual Activity  . Alcohol use: No  . Drug use: No  . Sexual activity: Not on file  Lifestyle  . Physical activity:    Days per week: Not on file    Minutes per session: Not on file  . Stress: Not on file  Relationships  . Social connections:    Talks on phone: Not on file    Gets together: Not on file    Attends religious service: Not on file    Active member of club or organization: Not on file    Attends meetings of clubs or organizations: Not on file    Relationship status: Not on file  Other Topics Concern  . Not on file  Social  History Narrative  . Not on file     Family History: The patient's ***family history includes Colon cancer in her maternal grandmother.  ROS:   Please see the history of present illness.    *** All other systems reviewed and are negative.  EKGs/Labs/Other Studies Reviewed:    The following studies were reviewed today: ***  EKG:  EKG is *** ordered today.  The ekg ordered today demonstrates ***  Recent Labs: No results found for requested labs within last 8760 hours.  Recent Lipid Panel    Component Value Date/Time   CHOL 187 10/24/2015 1712   TRIG 124 10/24/2015 1712   HDL 51 10/24/2015 1712   CHOLHDL 3.7 10/24/2015 1712   VLDL 25 10/24/2015 1712   LDLCALC 111 (H) 10/24/2015 1712    Physical Exam:    VS:   There were no vitals taken for this visit.    Wt Readings from Last 3 Encounters:  09/24/17 210 lb 1.6 oz (95.3 kg)  02/17/17 199 lb 1.6 oz (90.3 kg)  11/26/16 194 lb 4.8 oz (88.1 kg)     GEN: *** Well nourished, well developed in no acute distress HEENT: Normal NECK: No JVD; No carotid bruits LYMPHATICS: No lymphadenopathy CARDIAC: ***RRR, no murmurs, rubs, gallops RESPIRATORY:  Clear to auscultation without rales, wheezing or rhonchi  ABDOMEN: Soft, non-tender, non-distended MUSCULOSKELETAL:  No edema; No deformity  SKIN: Warm and dry NEUROLOGIC:  Alert and oriented x 3 PSYCHIATRIC:  Normal affect   ASSESSMENT:    No diagnosis found. PLAN:    In order of problems listed above:  1. ***   Medication Adjustments/Labs and Tests Ordered: Current medicines are reviewed at length with the patient today.  Concerns regarding medicines are outlined above.  No orders of the defined types were placed in this encounter.  No orders of the defined types were placed in this encounter.   There are no Patient Instructions on file for this visit.   Signed, Mertie Moores, MD  04/26/2018 9:25 PM    [facility]  This encounter was created in error - please disregard.

## 2018-04-27 ENCOUNTER — Encounter: Payer: Medicare HMO | Admitting: Cardiovascular Disease

## 2018-04-28 DIAGNOSIS — J441 Chronic obstructive pulmonary disease with (acute) exacerbation: Secondary | ICD-10-CM | POA: Diagnosis not present

## 2018-04-28 DIAGNOSIS — R197 Diarrhea, unspecified: Secondary | ICD-10-CM | POA: Diagnosis not present

## 2018-04-29 DIAGNOSIS — M9903 Segmental and somatic dysfunction of lumbar region: Secondary | ICD-10-CM | POA: Diagnosis not present

## 2018-04-29 DIAGNOSIS — M9901 Segmental and somatic dysfunction of cervical region: Secondary | ICD-10-CM | POA: Diagnosis not present

## 2018-04-29 DIAGNOSIS — M9902 Segmental and somatic dysfunction of thoracic region: Secondary | ICD-10-CM | POA: Diagnosis not present

## 2018-04-29 DIAGNOSIS — M5430 Sciatica, unspecified side: Secondary | ICD-10-CM | POA: Diagnosis not present

## 2018-05-04 ENCOUNTER — Other Ambulatory Visit: Payer: Self-pay | Admitting: Family Medicine

## 2018-05-04 DIAGNOSIS — R911 Solitary pulmonary nodule: Secondary | ICD-10-CM

## 2018-05-11 DIAGNOSIS — J449 Chronic obstructive pulmonary disease, unspecified: Secondary | ICD-10-CM | POA: Diagnosis not present

## 2018-05-11 DIAGNOSIS — R05 Cough: Secondary | ICD-10-CM | POA: Diagnosis not present

## 2018-05-11 DIAGNOSIS — Z720 Tobacco use: Secondary | ICD-10-CM | POA: Diagnosis not present

## 2018-05-11 DIAGNOSIS — R197 Diarrhea, unspecified: Secondary | ICD-10-CM | POA: Diagnosis not present

## 2018-05-12 ENCOUNTER — Ambulatory Visit
Admission: RE | Admit: 2018-05-12 | Discharge: 2018-05-12 | Disposition: A | Discharge status: Home / Self Care | Payer: Medicare HMO | Source: Ambulatory Visit | Attending: Family Medicine | Admitting: Family Medicine

## 2018-05-12 DIAGNOSIS — R911 Solitary pulmonary nodule: Secondary | ICD-10-CM

## 2018-05-12 DIAGNOSIS — R918 Other nonspecific abnormal finding of lung field: Secondary | ICD-10-CM | POA: Diagnosis not present

## 2018-05-12 MED ORDER — IOPAMIDOL (ISOVUE-300) INJECTION 61%
75.0000 mL | Freq: Once | INTRAVENOUS | Status: AC | PRN
  Administered 2018-05-12: 75 mL via INTRAVENOUS

## 2018-05-19 ENCOUNTER — Encounter: Payer: Self-pay | Admitting: Cardiovascular Disease

## 2018-05-19 ENCOUNTER — Other Ambulatory Visit: Payer: Self-pay | Admitting: Family Medicine

## 2018-05-19 DIAGNOSIS — D3500 Benign neoplasm of unspecified adrenal gland: Secondary | ICD-10-CM

## 2018-05-21 DIAGNOSIS — D3500 Benign neoplasm of unspecified adrenal gland: Secondary | ICD-10-CM | POA: Diagnosis not present

## 2018-05-22 ENCOUNTER — Ambulatory Visit
Admission: RE | Admit: 2018-05-22 | Discharge: 2018-05-22 | Disposition: A | Discharge status: Home / Self Care | Payer: Medicare HMO | Source: Ambulatory Visit | Attending: Family Medicine | Admitting: Family Medicine

## 2018-05-22 DIAGNOSIS — D3500 Benign neoplasm of unspecified adrenal gland: Secondary | ICD-10-CM | POA: Diagnosis not present

## 2018-05-25 DIAGNOSIS — D3500 Benign neoplasm of unspecified adrenal gland: Secondary | ICD-10-CM | POA: Diagnosis not present

## 2018-05-25 DIAGNOSIS — Z853 Personal history of malignant neoplasm of breast: Secondary | ICD-10-CM | POA: Diagnosis not present

## 2018-05-25 DIAGNOSIS — F331 Major depressive disorder, recurrent, moderate: Secondary | ICD-10-CM | POA: Diagnosis not present

## 2018-05-25 DIAGNOSIS — F411 Generalized anxiety disorder: Secondary | ICD-10-CM | POA: Diagnosis not present

## 2018-05-25 DIAGNOSIS — K635 Polyp of colon: Secondary | ICD-10-CM | POA: Diagnosis not present

## 2018-05-25 DIAGNOSIS — F1021 Alcohol dependence, in remission: Secondary | ICD-10-CM | POA: Diagnosis not present

## 2018-05-27 DIAGNOSIS — D3500 Benign neoplasm of unspecified adrenal gland: Secondary | ICD-10-CM | POA: Diagnosis not present

## 2018-06-10 DIAGNOSIS — L728 Other follicular cysts of the skin and subcutaneous tissue: Secondary | ICD-10-CM | POA: Diagnosis not present

## 2018-06-10 DIAGNOSIS — B078 Other viral warts: Secondary | ICD-10-CM | POA: Diagnosis not present

## 2018-06-10 DIAGNOSIS — L218 Other seborrheic dermatitis: Secondary | ICD-10-CM | POA: Diagnosis not present

## 2018-06-15 ENCOUNTER — Ambulatory Visit: Payer: Medicare HMO | Admitting: Cardiovascular Disease

## 2018-06-17 DIAGNOSIS — F4310 Post-traumatic stress disorder, unspecified: Secondary | ICD-10-CM | POA: Diagnosis not present

## 2018-06-17 DIAGNOSIS — F1021 Alcohol dependence, in remission: Secondary | ICD-10-CM | POA: Diagnosis not present

## 2018-06-17 DIAGNOSIS — F339 Major depressive disorder, recurrent, unspecified: Secondary | ICD-10-CM | POA: Diagnosis not present

## 2018-06-17 DIAGNOSIS — G4700 Insomnia, unspecified: Secondary | ICD-10-CM | POA: Diagnosis not present

## 2018-07-20 DIAGNOSIS — R1013 Epigastric pain: Secondary | ICD-10-CM | POA: Diagnosis not present

## 2018-07-20 DIAGNOSIS — K259 Gastric ulcer, unspecified as acute or chronic, without hemorrhage or perforation: Secondary | ICD-10-CM | POA: Diagnosis not present

## 2018-07-29 DIAGNOSIS — M79642 Pain in left hand: Secondary | ICD-10-CM | POA: Diagnosis not present

## 2018-07-29 DIAGNOSIS — R110 Nausea: Secondary | ICD-10-CM | POA: Diagnosis not present

## 2018-07-29 DIAGNOSIS — M19042 Primary osteoarthritis, left hand: Secondary | ICD-10-CM | POA: Diagnosis not present

## 2018-07-29 DIAGNOSIS — R1013 Epigastric pain: Secondary | ICD-10-CM | POA: Diagnosis not present

## 2018-08-03 DIAGNOSIS — R1013 Epigastric pain: Secondary | ICD-10-CM | POA: Diagnosis not present

## 2018-08-03 DIAGNOSIS — Z86010 Personal history of colonic polyps: Secondary | ICD-10-CM | POA: Diagnosis not present

## 2018-08-03 DIAGNOSIS — K589 Irritable bowel syndrome without diarrhea: Secondary | ICD-10-CM | POA: Diagnosis not present

## 2018-08-06 DIAGNOSIS — R1013 Epigastric pain: Secondary | ICD-10-CM | POA: Diagnosis not present

## 2018-08-06 DIAGNOSIS — K3189 Other diseases of stomach and duodenum: Secondary | ICD-10-CM | POA: Diagnosis not present

## 2018-08-06 DIAGNOSIS — K2950 Unspecified chronic gastritis without bleeding: Secondary | ICD-10-CM | POA: Diagnosis not present

## 2018-08-10 ENCOUNTER — Ambulatory Visit: Payer: Medicare HMO | Admitting: Cardiovascular Disease

## 2018-08-11 DIAGNOSIS — F339 Major depressive disorder, recurrent, unspecified: Secondary | ICD-10-CM | POA: Diagnosis not present

## 2018-08-11 DIAGNOSIS — G4700 Insomnia, unspecified: Secondary | ICD-10-CM | POA: Diagnosis not present

## 2018-08-11 DIAGNOSIS — F4310 Post-traumatic stress disorder, unspecified: Secondary | ICD-10-CM | POA: Diagnosis not present

## 2018-08-11 DIAGNOSIS — F1021 Alcohol dependence, in remission: Secondary | ICD-10-CM | POA: Diagnosis not present

## 2018-09-11 DIAGNOSIS — F4310 Post-traumatic stress disorder, unspecified: Secondary | ICD-10-CM | POA: Diagnosis not present

## 2018-09-11 DIAGNOSIS — G4700 Insomnia, unspecified: Secondary | ICD-10-CM | POA: Diagnosis not present

## 2018-09-11 DIAGNOSIS — F339 Major depressive disorder, recurrent, unspecified: Secondary | ICD-10-CM | POA: Diagnosis not present

## 2018-09-11 DIAGNOSIS — F1021 Alcohol dependence, in remission: Secondary | ICD-10-CM | POA: Diagnosis not present

## 2018-09-15 DIAGNOSIS — Z9109 Other allergy status, other than to drugs and biological substances: Secondary | ICD-10-CM | POA: Diagnosis not present

## 2018-09-15 DIAGNOSIS — J0100 Acute maxillary sinusitis, unspecified: Secondary | ICD-10-CM | POA: Diagnosis not present

## 2018-09-21 ENCOUNTER — Other Ambulatory Visit: Payer: Self-pay | Admitting: *Deleted

## 2018-09-21 DIAGNOSIS — C50211 Malignant neoplasm of upper-inner quadrant of right female breast: Secondary | ICD-10-CM

## 2018-09-21 DIAGNOSIS — Z170 Estrogen receptor positive status [ER+]: Principal | ICD-10-CM

## 2018-09-21 NOTE — Assessment & Plan Note (Signed)
Right lumpectomy 02/14/2016: Invasive lobular cancer, grade 1, 1.8 cm, margins negative but close, 0/1 lymph node negative, T1 CN 0 stage IA, ER 95%, PR 95%, Ki-67 10%, HER-2 negative ratio 1.31 We did not order Oncotype DX testing because the patient is not interested in chemotherapy.  Treatment summary: 1. Adjuvant radiation therapy (at [facility]) completed 05/08/2016 2. Adjuvant antiestrogen therapy with anastrozole 1 mg daily since 05/20/2016  Anastrozole toxicities: Denies any myalgias. Does have occasional hot flashes.  Breast cancer surveillance: 1.Breast exam done 09/24/2017: Benign 2. Mammograms September 2019 at [facility] benign  Left ankle fracture with severe osteopenia : Her last bone density test at [facility] on 12/21/2015 revealed a T score of -2.4: On Fosamax with calcium and vitamin D  I sent a refill for Fosamax and anastrozole Return to clinic in 1 year for follow-up

## 2018-09-22 ENCOUNTER — Telehealth: Payer: Self-pay | Admitting: Hematology and Oncology

## 2018-09-22 NOTE — Telephone Encounter (Signed)
Called regarding upcoming [facility] appointment, left patient a voicemail and e-mail has been sent.  °

## 2018-09-23 NOTE — Progress Notes (Signed)
  HEMATOLOGY-ONCOLOGY TELEPHONE VISIT PROGRESS NOTE  I connected with Elizabeth Cherry on 09/24/2018 at  2:45 PM EDT by telephone and verified that I am speaking with the correct person using two identifiers.  I discussed the limitations, risks, security and privacy concerns of performing an evaluation and management service by telephone and the availability of in person appointments.  I also discussed with the patient that there may be a patient responsible charge related to this service. The patient expressed understanding and agreed to proceed.   History of Present Illness: Elizabeth Cherry is a 62 y.o. female with above-mentioned history of right breast cancer treated with lumpectomy, radiation, and who is currently on anastrozole therapy. I last saw her a year ago. CT abdomen on 05/22/18 showed no evidence of malignancy. She presents today over the phone for annual follow-up.  She had C.Diff and Gastritis. Shes seeing GI. Finally her symptoms got better in April. She had sinus infections.    Breast cancer of upper-inner quadrant of right female breast ([place])   01/03/2016 Initial Diagnosis    Palpable right breast abnormality 1.6 cm at 2:00 position axilla negative ultrasound biopsy invasive lobular cancer grade 1, ER 95%, PR 95%, Ki-67 10%, HER-2 negative ratio 1.31, T1 cN0 stage IA clinical stage    02/14/2016 Surgery    Right lumpectomy: Invasive lobular cancer, grade 1, 1.8 cm, margins negative but close, 0/1 lymph node negative, T1 CN 0 stage IA, ER 95%, PR 95%, Ki-67 10%, HER-2 negative ratio 1.31    03/20/2016 - 05/08/2016 Radiation Therapy    Adjuvant radiation therapy at [facility]    05/20/2016 -  Anti-estrogen oral therapy    Anastrozole 1 mg by mouth daily     Observations/Objective:  No signs or symptoms of breast cancer recurrence.   Assessment Plan:  Breast cancer of upper-inner quadrant of right female breast ([place]) Right lumpectomy 02/14/2016: Invasive lobular cancer, grade 1, 1.8 cm,  margins negative but close, 0/1 lymph node negative, T1 CN 0 stage IA, ER 95%, PR 95%, Ki-67 10%, HER-2 negative ratio 1.31 We did not order Oncotype DX testing because the patient is not interested in chemotherapy.  Treatment summary: 1. Adjuvant radiation therapy (at [facility]) completed 05/08/2016 2. Adjuvant antiestrogen therapy with anastrozole 1 mg daily since 05/20/2016  Anastrozole toxicities: Denies any myalgias. Does have occasional hot flashes.  Breast cancer surveillance: 1.Breast exam done 09/24/2017: Benign 2. Mammograms September 2019 at [facility]. Biopsy: benign  Left ankle fracture with severe osteopenia : Her last bone density test at [facility] in [facility] on 12/21/2015 revealed a T score of -2.4: On Fosamax with calcium and vitamin D. She will restart it soon.  Severe GI problems with C.Diff and Gastritis: Seeing GI She takes 7 different supplements  Return to clinic in 1 year for follow-up    I discussed the assessment and treatment plan with the patient. The patient was provided an opportunity to ask questions and all were answered. The patient agreed with the plan and demonstrated an understanding of the instructions. The patient was advised to call back or seek an in-person evaluation if the symptoms worsen or if the condition fails to improve as anticipated.   I provided 15 minutes of non-face-to-face time during this encounter.   Rulon Eisenmenger, MD 09/24/2018    I, Molly Dorshimer, am acting as scribe for Nicholas Lose, MD.  I have reviewed the above documentation for accuracy and completeness, and I agree with the above.

## 2018-09-24 ENCOUNTER — Inpatient Hospital Stay: Payer: Medicare HMO | Attending: Hematology and Oncology | Admitting: Hematology and Oncology

## 2018-09-24 DIAGNOSIS — C50211 Malignant neoplasm of upper-inner quadrant of right female breast: Secondary | ICD-10-CM | POA: Diagnosis not present

## 2018-09-24 DIAGNOSIS — Z79811 Long term (current) use of aromatase inhibitors: Secondary | ICD-10-CM

## 2018-09-24 DIAGNOSIS — Z923 Personal history of irradiation: Secondary | ICD-10-CM

## 2018-09-24 DIAGNOSIS — Z170 Estrogen receptor positive status [ER+]: Secondary | ICD-10-CM

## 2018-09-24 MED ORDER — ANASTROZOLE 1 MG PO TABS: ORAL_TABLET | ORAL | 3 refills | Status: DC

## 2018-10-01 ENCOUNTER — Telehealth: Payer: Self-pay | Admitting: Nurse Practitioner

## 2018-10-01 NOTE — Telephone Encounter (Signed)
Attempted to call patient to discuss her appointment with Dr. Acie Fredrickson scheduled for 5/21. Voice mail is full and I was unable to leave message

## 2018-10-05 NOTE — Telephone Encounter (Signed)
Spoke with patient who states she is transferring care from Dr. Jenkins Rouge and would like to make certain all of her records have been faxed from his office. She denies cardiac concerns and would like to reschedule to July for an in office visit with Dr. Acie Fredrickson. I advised her to call back prior to July with any questions or concerns. She thanked me for the call.

## 2018-10-08 ENCOUNTER — Ambulatory Visit: Payer: Medicare HMO | Admitting: Cardiovascular Disease

## 2018-10-15 DIAGNOSIS — F4310 Post-traumatic stress disorder, unspecified: Secondary | ICD-10-CM | POA: Diagnosis not present

## 2018-10-15 DIAGNOSIS — F339 Major depressive disorder, recurrent, unspecified: Secondary | ICD-10-CM | POA: Diagnosis not present

## 2018-10-15 DIAGNOSIS — F1021 Alcohol dependence, in remission: Secondary | ICD-10-CM | POA: Diagnosis not present

## 2018-10-15 DIAGNOSIS — G4700 Insomnia, unspecified: Secondary | ICD-10-CM | POA: Diagnosis not present

## 2018-12-01 DIAGNOSIS — F339 Major depressive disorder, recurrent, unspecified: Secondary | ICD-10-CM | POA: Diagnosis not present

## 2018-12-01 DIAGNOSIS — G4700 Insomnia, unspecified: Secondary | ICD-10-CM | POA: Diagnosis not present

## 2018-12-01 DIAGNOSIS — F1021 Alcohol dependence, in remission: Secondary | ICD-10-CM | POA: Diagnosis not present

## 2018-12-01 DIAGNOSIS — F4310 Post-traumatic stress disorder, unspecified: Secondary | ICD-10-CM | POA: Diagnosis not present

## 2018-12-07 ENCOUNTER — Telehealth: Payer: Self-pay | Admitting: Cardiovascular Disease

## 2018-12-07 NOTE — Progress Notes (Signed)
Cardiology Office Note:    Date:  12/08/2018   ID:  Elizabeth Cherry, DOB 02-21-1956, MRN [id number]  PCP:  Jettie Booze, NP  Cardiologist:  Danila Eddie  Electrophysiologist:  None   Referring MD: Glenford Bayley, DO   Chief Complaint  Patient presents with  . Hypertension  . Hyperlipidemia    Problem List 1.   Carotid disease - 50% RICA stenosis  2.  Hypertension 3.  Hyperlipidemia     December 08, 2018     Elizabeth Cherry is a 63 y.o. female with a hx of hyperlipidemia and carotid artery disease.  Lots and lots and lots of anxiety issues !   Transfer from Dr. Einar Gip  occasionall CP when she is stressed.   Has had Lexiscan myoivew at Dr. Irven Shelling office.   Was normal .  Does not get any regular exercise  ( does not want to exercise,  Is on disability )     She has no interest in exercising.   Still smoking ( 1-2 ppd )  Has tried CBD gummies which helps her not smoke .   History of hyperlipidemia.  She states that all the statins that she has tried has made her feel like fibromyalgia issues.  She is unwilling to restart statin medication.   Past Medical History:  Diagnosis Date  . Alcoholism ([place])    IN REMISSION ALMOST 20 YEARS  . Allergic rhinitis   . Anorectal hemorrhage   . Anxiety   . Asthma   . Benign neoplasm of colon 10/24/2015  . Borderline personality disorder ([place])   . Breast cancer of upper-inner quadrant of right female breast ([place]) 01/08/2016  . Carotid stenosis, bilateral   . Chronic gastritis without bleeding    OTHER, WITHOUT HEMORRHAGE  . Chronic obstructive pulmonary disease ([place]) 11/15/2011  . Chronic pain 10/24/2011   Overview:  Managed by [facility]. Gets Fentanyl Patches - sees them every other month.   Marland Kitchen COPD (chronic obstructive pulmonary disease) ([place])   . Depression   . Dyslipidemia   . Edema    due to psychiatric meds per patient, on a diuretic  . Elevated cholesterol   . Fibromyalgia   . Generalized anxiety disorder    . GERD (gastroesophageal reflux disease)   . Hypertension    no meds now  . Irritable bowel syndrome    UNSPECIFIED TYPE  . Malignant neoplasm of unspecified site of unspecified female breast ([place])   . Mixed hyperlipidemia   . Multiple gastric ulcers   . Personal history of colonic polyps   . Psoriasis 02/11/2012  . Sciatica   . Self neglect 10/23/2015  . Severe recurrent major depression without psychotic features ([place]) 10/23/2015  . Tobacco dependence     Past Surgical History:  Procedure Laterality Date  . BREAST LUMPECTOMY WITH RADIOACTIVE SEED AND SENTINEL LYMPH NODE BIOPSY Right 02/14/2016   Procedure: BREAST LUMPECTOMY WITH RADIOACTIVE SEED AND SENTINEL LYMPH NODE BIOPSY;  Surgeon: Excell Seltzer, MD;  Location: [facility];  Service: General;  Laterality: Right;  . DIAGNOSTIC LAPAROSCOPY    . DILATION AND CURETTAGE OF UTERUS    . GYNECOLOGIC CRYOSURGERY      Current Medications: Current Meds  Medication Sig  . acetaminophen (TYLENOL) 500 MG tablet Take 1,000 mg by mouth every 6 (six) hours as needed.  Marland Kitchen albuterol (PROVENTIL HFA;VENTOLIN HFA) 108 (90 Base) MCG/ACT inhaler Inhale 1-2 puffs into the lungs every 6 (six) hours as needed for  wheezing or shortness of breath.  Marland Kitchen alendronate (FOSAMAX) 70 MG tablet Take 1 tablet (70 mg total) by mouth once a week. full glass of water on an empty stomach.  Marland Kitchen anastrozole (ARIMIDEX) 1 MG tablet TAKE (1) TABLET BY MOUTH ONCE DAILY.  Marland Kitchen BREO ELLIPTA 200-25 MCG/INH AEPB   . budesonide-formoterol (SYMBICORT) 160-4.5 MCG/ACT inhaler Inhale 2 puffs into the lungs 2 (two) times a day.  . clonazePAM (KLONOPIN) 0.5 MG tablet Take 1 tablet (0.5 mg total) by mouth 3 (three) times daily as needed (anxiety or sleep).  Marland Kitchen HYDROCHLOROTHIAZIDE PO Take by mouth.  . hydrOXYzine (ATARAX/VISTARIL) 50 MG tablet Take 1 tablet (50 mg total) by mouth at bedtime.  . hydrOXYzine (VISTARIL) 50 MG capsule Take 2 capsules by mouth at bedtime.  .  Montelukast Sodium (SINGULAIR PO) Take by mouth.  . pantoprazole (PROTONIX) 40 MG tablet Take 1 tablet (40 mg total) by mouth daily.  . Probiotic Product (SOLUBLE FIBER/PROBIOTICS PO) Take by mouth.  . QUEtiapine (SEROQUEL) 100 MG tablet Take 100 mg by mouth at bedtime. 150-200mg   . sertraline (ZOLOFT) 100 MG tablet Take 150 mg by mouth daily.  Marland Kitchen tiotropium (SPIRIVA HANDIHALER) 18 MCG inhalation capsule Place 1 capsule (18 mcg total) into inhaler and inhale daily.  . traMADol (ULTRAM) 50 MG tablet Take by mouth every 6 (six) hours as needed.     Allergies:   Bactrim [sulfamethoxazole-trimethoprim], Diazepam, Fluoxetine, Telmisartan, Tetracycline, Tetracyclines & related, Aspirin, Cefdinir, Clarithromycin, Nsaids, Risperidone, Trazodone, and Oxycodone   Social History   Socioeconomic History  . Marital status: Single    Spouse name: Not on file  . Number of children: Not on file  . Years of education: Not on file  . Highest education level: Not on file  Occupational History  . Not on file  Social Needs  . Financial resource strain: Not on file  . Food insecurity    Worry: Not on file    Inability: Not on file  . Transportation needs    Medical: Not on file    Non-medical: Not on file  Tobacco Use  . Smoking status: Current Every Day Smoker    Packs/day: 1.50    Types: Cigarettes  . Smokeless tobacco: Never Used  Substance and Sexual Activity  . Alcohol use: No  . Drug use: No  . Sexual activity: Not on file  Lifestyle  . Physical activity    Days per week: Not on file    Minutes per session: Not on file  . Stress: Not on file  Relationships  . Social Herbalist on phone: Not on file    Gets together: Not on file    Attends religious service: Not on file    Active member of club or organization: Not on file    Attends meetings of clubs or organizations: Not on file    Relationship status: Not on file  Other Topics Concern  . Not on file  Social History  Narrative  . Not on file     Family History: The patient's family history includes Colon cancer in her maternal grandmother.  ROS:   Please see the history of present illness.     All other systems reviewed and are negative.  EKGs/Labs/Other Studies Reviewed:    The following studies were reviewed today:   EKG:  EKG is  ordered today.  The ekg ordered today demonstrates sinus tachycardia at 102 beats a minute.  She has occasional premature atrial contractions.  Nonspecific ST and T wave abnormalities.  Recent Labs: 12/08/2018: ALT 19; BUN 9; Creatinine, Ser 1.07; Potassium 3.3; Sodium 133  Recent Lipid Panel    Component Value Date/Time   CHOL 249 (H) 12/08/2018 1048   TRIG 182 (H) 12/08/2018 1048   HDL 52 12/08/2018 1048   CHOLHDL 4.8 (H) 12/08/2018 1048   CHOLHDL 3.7 10/24/2015 1712   VLDL 25 10/24/2015 1712   LDLCALC 161 (H) 12/08/2018 1048    Physical Exam:    VS:  BP 120/82   Pulse (!) 102   Ht 5\' 6"  (1.676 m)   Wt 168 lb 12.8 oz (76.6 kg)   SpO2 92%   BMI 27.25 kg/m     Wt Readings from Last 3 Encounters:  12/08/18 168 lb 12.8 oz (76.6 kg)  09/24/17 210 lb 1.6 oz (95.3 kg)  02/17/17 199 lb 1.6 oz (90.3 kg)     GEN: middel age, generally weak female,  HEENT: Normal NECK: No JVD; No carotid bruits LYMPHATICS: No lymphadenopathy CARDIAC:   RR  RESPIRATORY:  Clear to auscultation without rales, wheezing or rhonchi  ABDOMEN: Soft, non-tender, non-distended MUSCULOSKELETAL:  No edema; No deformity  SKIN: Warm and dry NEUROLOGIC:  Alert and oriented x 3 PSYCHIATRIC:  Normal affect   ASSESSMENT:    1. Hyperlipidemia, unspecified hyperlipidemia type   2. Bilateral carotid artery stenosis    PLAN:    In order of problems listed above:  1. Hyperlipidemia: She had markedly elevated cholesterol levels and triglyceride levels when her labs are drawn a year or so ago.  She is not able to take any statin medications.  We will redraw lipids, liver enzymes,  basic metabolic profile today.  We will go ahead and refer her to lipid clinic for consideration of a PCSK9 inhibitor.  2.  Hypertension: She is remains on HCTZ.  She insists that her hypertension has been cured and that she no longer carries this diagnosis.  2.  Carotid artery disease: She has a 50% right carotid stenosis.  We will continue to be very aggressive with her lipid lowering if possible.  I have encouraged her to stop smoking.  4.  Cigarette smoking: I encouraged her to work on her smoking cessation.   Medication Adjustments/Labs and Tests Ordered: Current medicines are reviewed at length with the patient today.  Concerns regarding medicines are outlined above.  Orders Placed This Encounter  Procedures  . Lipid Profile  . Hepatic function panel  . Basic Metabolic Panel (BMET)  . EKG 12-Lead   No orders of the defined types were placed in this encounter.   Patient Instructions  Medication Instructions:  Your physician recommends that you continue on your current medications as directed. Please refer to the Current Medication list given to you today.  If you need a refill on your cardiac medications before your next appointment, please call your pharmacy.    Lab work: TODAY - liver panel, basic metabolic panel, cholesterol  If you have labs (blood work) drawn today and your tests are completely normal, you will receive your results only by: Marland Kitchen MyChart Message (if you have MyChart) OR . A paper copy in the mail If you have any lab test that is abnormal or we need to change your treatment, we will call you to review the results.    Testing/Procedures: Your physician has requested that you have a carotid duplex. This test is an ultrasound of the carotid arteries in your neck. It looks at blood  flow through these arteries that supply the brain with blood. Allow one hour for this exam. There are no restrictions or special instructions.     Follow-Up: At [facility], you and your health needs are our priority.  As part of our continuing mission to provide you with exceptional heart care, we have created designated Provider Care Teams.  These Care Teams include your primary Cardiologist (physician) and Advanced Practice Providers (APPs -  Physician Assistants and Nurse Practitioners) who all work together to provide you with the care you need, when you need it. You will need a follow up appointment in:  6 months.  Please call our office 2 months in advance to schedule this appointment.  You may see Dr. Acie Fredrickson or one of the following Advanced Practice Providers on your designated Care Team: Richardson Dopp, PA-C [place], [place] . Daune Perch, NP      Signed, Mertie Moores, MD  12/08/2018 5:37 PM    [place]

## 2018-12-07 NOTE — Telephone Encounter (Signed)

## 2018-12-08 ENCOUNTER — Ambulatory Visit (INDEPENDENT_AMBULATORY_CARE_PROVIDER_SITE_OTHER): Payer: Medicare HMO | Admitting: Cardiovascular Disease

## 2018-12-08 ENCOUNTER — Encounter: Payer: Self-pay | Admitting: Cardiovascular Disease

## 2018-12-08 ENCOUNTER — Other Ambulatory Visit: Payer: Self-pay

## 2018-12-08 VITALS — BP 120/82 | HR 102 | Ht 66.0 in | Wt 168.8 lb

## 2018-12-08 DIAGNOSIS — I6523 Occlusion and stenosis of bilateral carotid arteries: Secondary | ICD-10-CM

## 2018-12-08 DIAGNOSIS — E782 Mixed hyperlipidemia: Secondary | ICD-10-CM

## 2018-12-08 DIAGNOSIS — F17200 Nicotine dependence, unspecified, uncomplicated: Secondary | ICD-10-CM | POA: Diagnosis not present

## 2018-12-08 DIAGNOSIS — E785 Hyperlipidemia, unspecified: Secondary | ICD-10-CM | POA: Diagnosis not present

## 2018-12-08 LAB — HEPATIC FUNCTION PANEL
ALT: 19 IU/L (ref 0–32)
AST: 22 IU/L (ref 0–40)
Albumin: 4.4 g/dL (ref 3.8–4.9)
Alkaline Phosphatase: 123 IU/L — ABNORMAL HIGH (ref 39–117)
Bilirubin Total: 0.3 mg/dL (ref 0.0–1.2)
Bilirubin, Direct: 0.12 mg/dL (ref 0.00–0.40)
Total Protein: 6.5 g/dL (ref 6.0–8.5)

## 2018-12-08 LAB — BASIC METABOLIC PANEL
BUN/Creatinine Ratio: 8 — ABNORMAL LOW (ref 12–28)
BUN: 9 mg/dL (ref 8–27)
CO2: 27 mmol/L (ref 20–29)
Calcium: 9.6 mg/dL (ref 8.7–10.3)
Chloride: 89 mmol/L — ABNORMAL LOW (ref 96–106)
Creatinine, Ser: 1.07 mg/dL — ABNORMAL HIGH (ref 0.57–1.00)
GFR calc Af Amer: 65 mL/min/{1.73_m2} (ref >59)
GFR calc non Af Amer: 57 mL/min/{1.73_m2} — ABNORMAL LOW (ref >59)
Glucose: 104 mg/dL — ABNORMAL HIGH (ref 65–99)
Potassium: 3.3 mmol/L — ABNORMAL LOW (ref 3.5–5.2)
Sodium: 133 mmol/L — ABNORMAL LOW (ref 134–144)

## 2018-12-08 LAB — LIPID PANEL
Chol/HDL Ratio: 4.8 ratio — ABNORMAL HIGH (ref 0.0–4.4)
Cholesterol, Total: 249 mg/dL — ABNORMAL HIGH (ref 100–199)
HDL: 52 mg/dL (ref >39)
LDL Calculated: 161 mg/dL — ABNORMAL HIGH (ref 0–99)
Triglycerides: 182 mg/dL — ABNORMAL HIGH (ref 0–149)
VLDL Cholesterol Cal: 36 mg/dL (ref 5–40)

## 2018-12-08 NOTE — Patient Instructions (Addendum)
Medication Instructions:  Your physician recommends that you continue on your current medications as directed. Please refer to the Current Medication list given to you today.  If you need a refill on your cardiac medications before your next appointment, please call your pharmacy.    Lab work: TODAY - liver panel, basic metabolic panel, cholesterol  If you have labs (blood work) drawn today and your tests are completely normal, you will receive your results only by: Marland Kitchen MyChart Message (if you have MyChart) OR . A paper copy in the mail If you have any lab test that is abnormal or we need to change your treatment, we will call you to review the results.    Testing/Procedures: Your physician has requested that you have a carotid duplex. This test is an ultrasound of the carotid arteries in your neck. It looks at blood flow through these arteries that supply the brain with blood. Allow one hour for this exam. There are no restrictions or special instructions.     Follow-Up: At [facility], you and your health needs are our priority.  As part of our continuing mission to provide you with exceptional heart care, we have created designated Provider Care Teams.  These Care Teams include your primary Cardiologist (physician) and Advanced Practice Providers (APPs -  Physician Assistants and Nurse Practitioners) who all work together to provide you with the care you need, when you need it. You will need a follow up appointment in:  6 months.  Please call our office 2 months in advance to schedule this appointment.  You may see Dr. Acie Fredrickson or one of the following Advanced Practice Providers on your designated Care Team: Richardson Dopp, PA-C [place], [place] . Daune Perch, NP

## 2018-12-09 NOTE — Addendum Note (Signed)
Addended by: Emmaline Life on: 12/09/2018 05:20 PM   Modules accepted: Orders

## 2018-12-10 DIAGNOSIS — Z9109 Other allergy status, other than to drugs and biological substances: Secondary | ICD-10-CM | POA: Diagnosis not present

## 2018-12-10 DIAGNOSIS — J449 Chronic obstructive pulmonary disease, unspecified: Secondary | ICD-10-CM | POA: Diagnosis not present

## 2018-12-10 DIAGNOSIS — I10 Essential (primary) hypertension: Secondary | ICD-10-CM | POA: Diagnosis not present

## 2018-12-10 DIAGNOSIS — R05 Cough: Secondary | ICD-10-CM | POA: Diagnosis not present

## 2018-12-10 DIAGNOSIS — M797 Fibromyalgia: Secondary | ICD-10-CM | POA: Diagnosis not present

## 2018-12-10 DIAGNOSIS — G8929 Other chronic pain: Secondary | ICD-10-CM | POA: Diagnosis not present

## 2018-12-10 DIAGNOSIS — R110 Nausea: Secondary | ICD-10-CM | POA: Diagnosis not present

## 2018-12-10 DIAGNOSIS — K2970 Gastritis, unspecified, without bleeding: Secondary | ICD-10-CM | POA: Diagnosis not present

## 2018-12-22 ENCOUNTER — Ambulatory Visit (HOSPITAL_COMMUNITY)
Admission: RE | Admit: 2018-12-22 | Discharge: 2018-12-22 | Disposition: A | Discharge status: Home / Self Care | Payer: Medicare HMO | Source: Ambulatory Visit | Attending: Cardiovascular Disease | Admitting: Cardiovascular Disease

## 2018-12-22 ENCOUNTER — Other Ambulatory Visit: Payer: Self-pay

## 2018-12-22 DIAGNOSIS — I6523 Occlusion and stenosis of bilateral carotid arteries: Secondary | ICD-10-CM

## 2018-12-22 DIAGNOSIS — E785 Hyperlipidemia, unspecified: Secondary | ICD-10-CM | POA: Insufficient documentation

## 2018-12-23 ENCOUNTER — Other Ambulatory Visit: Payer: Self-pay | Admitting: Cardiovascular Disease

## 2018-12-23 DIAGNOSIS — I771 Stricture of artery: Secondary | ICD-10-CM

## 2018-12-23 DIAGNOSIS — I6523 Occlusion and stenosis of bilateral carotid arteries: Secondary | ICD-10-CM

## 2018-12-24 ENCOUNTER — Ambulatory Visit: Payer: Medicare HMO

## 2019-01-15 ENCOUNTER — Telehealth: Payer: Medicare HMO

## 2019-02-03 ENCOUNTER — Telehealth (INDEPENDENT_AMBULATORY_CARE_PROVIDER_SITE_OTHER): Payer: Medicare HMO | Admitting: Pharmacist

## 2019-02-03 ENCOUNTER — Other Ambulatory Visit: Payer: Self-pay

## 2019-02-03 DIAGNOSIS — T466X5A Adverse effect of antihyperlipidemic and antiarteriosclerotic drugs, initial encounter: Secondary | ICD-10-CM

## 2019-02-03 DIAGNOSIS — E785 Hyperlipidemia, unspecified: Secondary | ICD-10-CM

## 2019-02-03 DIAGNOSIS — G720 Drug-induced myopathy: Secondary | ICD-10-CM

## 2019-02-03 MED ORDER — REPATHA SURECLICK 140 MG/ML ~~LOC~~ SOAJ: 1.0000 "pen " | SUBCUTANEOUS | 11 refills | Status: DC

## 2019-02-03 NOTE — Progress Notes (Signed)
Patient ID: Elizabeth Cherry                 DOB: 07-Mar-1956                    MRN: [id number]     HPI: Elizabeth Cherry is a 63 y.o. female patient referred to lipid clinic by Dr Acie Fredrickson. PMH is significant for CAD with 50% right carotid stenosis, HLD, HTN, tobacco abuse, COPD, fibromyalgia, and depression/anxiety (occasional CP noted when she is stressed). She is statin intolerant and was referred to lipid clinic to discuss PCSK9i therapy.  Visit conducted today via telephone. Pt has been a bit stressed lately - she is dealing with a mold issue in her house. She eats less when she gets stressed and has lost 10 lbs in the past few weeks.   Pt states all statins she has tried in the past have made her fibromyalgia worse. Reports her PCP started her on a statin in 2003 and stopped therapy after she "didn't have any cholesterol left." States she didn't respond to another statin and her cholesterol went up. She is excited to try PCSK9i therapy since she has tolerated other injections in the past better than pills. States she is in the donut hole already from her inhalers.  Current Medications: none Intolerances: pravastatin 40mg  daily, atorvastatin 20mg  daily, rosuvastatin - muscle aches, felt like her fibromyalgia returned Risk Factors: CAD, HTN, tobacco abuse, sedentary lifestyle LDL goal: 70mg /dL  Diet: Chex Mix, grits, bacon - does not eat when she's stressed, hast lost 10 lbs in the past few weeks  Exercise: No regular exercise, has no interest, is on disability  Family History: Colon cancer in her maternal grandmother.  Social History: Smoking 1-2 PPD, denies tobacco and alcohol use.  Labs: 12/08/18: TC 249, TG 182, HDL 52, LDL 161 - no lipid lowering therapy  Past Medical History:  Diagnosis Date  . Alcoholism ([place])    IN REMISSION ALMOST 20 YEARS  . Allergic rhinitis   . Anorectal hemorrhage   . Anxiety   . Asthma   . Benign neoplasm of colon 10/24/2015  . Borderline personality  disorder ([place])   . Breast cancer of upper-inner quadrant of right female breast ([place]) 01/08/2016  . Carotid stenosis, bilateral   . Chronic gastritis without bleeding    OTHER, WITHOUT HEMORRHAGE  . Chronic obstructive pulmonary disease ([place]) 11/15/2011  . Chronic pain 10/24/2011   Overview:  Managed by [facility]. Gets Fentanyl Patches - sees them every other month.   Marland Kitchen COPD (chronic obstructive pulmonary disease) ([place])   . Depression   . Dyslipidemia   . Edema    due to psychiatric meds per patient, on a diuretic  . Elevated cholesterol   . Fibromyalgia   . Generalized anxiety disorder   . GERD (gastroesophageal reflux disease)   . Hypertension    no meds now  . Irritable bowel syndrome    UNSPECIFIED TYPE  . Malignant neoplasm of unspecified site of unspecified female breast ([place])   . Mixed hyperlipidemia   . Multiple gastric ulcers   . Personal history of colonic polyps   . Psoriasis 02/11/2012  . Sciatica   . Self neglect 10/23/2015  . Severe recurrent major depression without psychotic features ([place]) 10/23/2015  . Tobacco dependence     Current Outpatient Medications on File Prior to Visit  Medication Sig Dispense Refill  . acetaminophen (TYLENOL) 500 MG tablet Take 1,000 mg by  mouth every 6 (six) hours as needed.    Marland Kitchen albuterol (PROVENTIL HFA;VENTOLIN HFA) 108 (90 Base) MCG/ACT inhaler Inhale 1-2 puffs into the lungs every 6 (six) hours as needed for wheezing or shortness of breath. 1 Inhaler 0  . alendronate (FOSAMAX) 70 MG tablet Take 1 tablet (70 mg total) by mouth once a week. full glass of water on an empty stomach. 12 tablet 3  . anastrozole (ARIMIDEX) 1 MG tablet TAKE (1) TABLET BY MOUTH ONCE DAILY. 90 tablet 3  . BREO ELLIPTA 200-25 MCG/INH AEPB   4  . budesonide-formoterol (SYMBICORT) 160-4.5 MCG/ACT inhaler Inhale 2 puffs into the lungs 2 (two) times a day.    . clonazePAM (KLONOPIN) 0.5 MG tablet Take 1 tablet (0.5 mg total) by mouth 3  (three) times daily as needed (anxiety or sleep). 90 tablet 0  . HYDROCHLOROTHIAZIDE PO Take by mouth.    . hydrOXYzine (ATARAX/VISTARIL) 50 MG tablet Take 1 tablet (50 mg total) by mouth at bedtime. 30 tablet 0  . hydrOXYzine (VISTARIL) 50 MG capsule Take 2 capsules by mouth at bedtime.    . Montelukast Sodium (SINGULAIR PO) Take by mouth.    . pantoprazole (PROTONIX) 40 MG tablet Take 1 tablet (40 mg total) by mouth daily. 30 tablet 0  . Probiotic Product (SOLUBLE FIBER/PROBIOTICS PO) Take by mouth.    . QUEtiapine (SEROQUEL) 100 MG tablet Take 100 mg by mouth at bedtime. 150-200mg     . sertraline (ZOLOFT) 100 MG tablet Take 150 mg by mouth daily.    Marland Kitchen tiotropium (SPIRIVA HANDIHALER) 18 MCG inhalation capsule Place 1 capsule (18 mcg total) into inhaler and inhale daily. 30 capsule 12  . traMADol (ULTRAM) 50 MG tablet Take by mouth every 6 (six) hours as needed.     No current facility-administered medications on file prior to visit.     Allergies  Allergen Reactions  . Bactrim [Sulfamethoxazole-Trimethoprim] Hives  . Diazepam Other (See Comments)    Other reaction(s): Other (See Comments) Other Reaction: severe depression Other Reaction: severe depression Other reaction(s): Other (See Comments) Other Reaction: severe depression  Other Reaction: severe depression Other reaction(s): Other (See Comments) Other Reaction: severe depression Other Reaction: severe depression Other reaction(s): Other (See Comments) Other Reaction: severe depression Other Reaction: severe depression Other reaction(s): Other (See Comments) Other Reaction: severe depression Other Reaction: severe depression Other reaction(s): Other (See Comments) Other Reaction: severe depression   . Fluoxetine Swelling and Anaphylaxis    Pt reports she is allergic to filler in prozac, her throat swell.  Pt reports she is allergic to filler in prozac, her throat swell.   Pt reports she is allergic to filler in  prozac, her throat swell.  Pt reports she is allergic to filler in prozac, her throat swell.  Pt reports she is allergic to filler in prozac, her throat swell.  Pt reports she is allergic to filler in prozac, her throat swell.    . Telmisartan Hives and Other (See Comments)    Other reaction(s): Other (See Comments) Other Reaction: OTHER REACTION Other Reaction: OTHER REACTION Other reaction(s): Other (See Comments) Other Reaction: OTHER REACTION  Other Reaction: OTHER REACTION Other reaction(s): Other (See Comments) Other Reaction: OTHER REACTION Other Reaction: OTHER REACTION Other reaction(s): Other (See Comments) Other Reaction: OTHER REACTION Other Reaction: OTHER REACTION Other reaction(s): Other (See Comments) Other Reaction: OTHER REACTION Other Reaction: OTHER REACTION Other reaction(s): Other (See Comments) Other Reaction: OTHER REACTION   . Tetracycline Hives and Rash  . Tetracyclines &  Related Hives and Rash  . Aspirin Other (See Comments)    Gastric ulcers  Other Reaction: stomach ulcer Gastric ulcers Other Reaction: stomach ulcer Gastric ulcers   . Cefdinir Other (See Comments)    Other reaction(s): Other (See Comments) Other Reaction: EXTREME EXHAUSTION Other Reaction: EXTREME EXHAUSTION Other reaction(s): Other (See Comments) Other Reaction: EXTREME EXHAUSTION  Other Reaction: EXTREME EXHAUSTION Other reaction(s): Other (See Comments) Other Reaction: EXTREME EXHAUSTION Other Reaction: EXTREME EXHAUSTION Other reaction(s): Other (See Comments) Other Reaction: EXTREME EXHAUSTION Other Reaction: EXTREME EXHAUSTION Other reaction(s): Other (See Comments) Other Reaction: EXTREME EXHAUSTION Other Reaction: EXTREME EXHAUSTION Other reaction(s): Other (See Comments) Other Reaction: EXTREME EXHAUSTION   . Clarithromycin Other (See Comments)    Other reaction(s): Unknown Other reaction(s): Unknown Other reaction(s): Unknown Other reaction(s): Unknown  Other reaction(s): Unknown Other reaction(s): Unknown   . Nsaids Other (See Comments)    Gastric ulcers  Other Reaction: stomach ulcer Other Reaction: stomach ulcer  Gastric ulcers  . Risperidone Nausea And Vomiting  . Trazodone Other (See Comments)    Dry eyes Dry eyes Dry eyes  Dry eyes Dry eyes Dry eyes Dry eyes Dry eyes Dry eyes Dry eyes Dry eyes   . Oxycodone Nausea Only    Assessment/Plan:  1. Hyperlipidemia - Baseline LDL 161 above goal < 70 due to hx of CAD. Pt is intolerant to 3 statins and is willing to try PCSK9i therapy. Will start Repatha 140mg  Q2W. Prior authorization submitted and approved through 08/02/19, [employer] submitted and approved for $2500 of copay assistance through 01/04/19. Pt will call with concerns, will plan to recheck lipids in 2-3 months to assess efficacy of Repatha.    E. Supple, PharmD, BCACP, [place] [id number] [place], [place] [address] Phone: [phone]; Fax: [phone] 02/03/2019 10:29 AM

## 2019-02-25 ENCOUNTER — Ambulatory Visit: Payer: Medicare HMO

## 2019-03-29 ENCOUNTER — Encounter (INDEPENDENT_AMBULATORY_CARE_PROVIDER_SITE_OTHER): Payer: Self-pay

## 2019-03-29 ENCOUNTER — Telehealth: Payer: Self-pay

## 2019-03-29 ENCOUNTER — Other Ambulatory Visit: Payer: Medicare HMO | Admitting: *Deleted

## 2019-03-29 ENCOUNTER — Other Ambulatory Visit: Payer: Self-pay

## 2019-03-29 DIAGNOSIS — E785 Hyperlipidemia, unspecified: Secondary | ICD-10-CM

## 2019-03-29 DIAGNOSIS — G720 Drug-induced myopathy: Secondary | ICD-10-CM

## 2019-03-29 NOTE — Telephone Encounter (Signed)
Called pt who reports edema in her ankles for the past week. Advised pt that Repatha should not be causing any swelling. No changes with her HCTZ. Denies increase in salt in her diet. Has been standing more than normal. Encouraged pt to elevate legs and can try compression stockings and will limit salt in her diet. She verbalized understanding.

## 2019-03-29 NOTE — Telephone Encounter (Signed)
New messsage   Pt c/o medication issue:  1. Name of Medication:  repatha 2. How are you currently taking this medication (dosage and times per day)?  1 shot every 2 weeks   3. Are you having a reaction (difficulty breathing--STAT)? yes  4. What is your medication issue? Her ankles are swollen since she started taking this medication

## 2019-03-30 LAB — LIPID PANEL
Chol/HDL Ratio: 3.9 ratio (ref 0.0–4.4)
Cholesterol, Total: 199 mg/dL (ref 100–199)
HDL: 51 mg/dL (ref >39)
LDL Chol Calc (NIH): 110 mg/dL — ABNORMAL HIGH (ref 0–99)
Triglycerides: 218 mg/dL — ABNORMAL HIGH (ref 0–149)
VLDL Cholesterol Cal: 38 mg/dL (ref 5–40)

## 2019-03-30 LAB — HEPATIC FUNCTION PANEL
ALT: 29 IU/L (ref 0–32)
AST: 27 IU/L (ref 0–40)
Albumin: 4.4 g/dL (ref 3.8–4.8)
Alkaline Phosphatase: 143 IU/L — ABNORMAL HIGH (ref 39–117)
Bilirubin Total: 0.4 mg/dL (ref 0.0–1.2)
Bilirubin, Direct: 0.13 mg/dL (ref 0.00–0.40)
Total Protein: 6.7 g/dL (ref 6.0–8.5)

## 2019-04-01 ENCOUNTER — Telehealth: Payer: Self-pay | Admitting: Pharmacist

## 2019-04-01 DIAGNOSIS — E785 Hyperlipidemia, unspecified: Secondary | ICD-10-CM

## 2019-04-01 NOTE — Addendum Note (Signed)
Addended by: SUPPLE, MEGAN E on: 04/01/2019 08:15 AM   Modules accepted: Orders

## 2019-04-01 NOTE — Telephone Encounter (Signed)
Called pt to discuss lipid panel results. LDL has dropped from 161 to 110 (32%) which is less than expected. She states she gave her last injection a week late and has only completed 3 injections so far. She wishes to recheck cholesterol again before discussing additional therapy. Will recheck lipids on 12/21, if LDL still remains above goal < 70, can discuss Nexlizet at that time.

## 2019-05-10 ENCOUNTER — Other Ambulatory Visit: Payer: Medicare HMO

## 2019-06-02 ENCOUNTER — Telehealth: Payer: Self-pay | Admitting: Cardiovascular Disease

## 2019-06-02 MED ORDER — PRALUENT 75 MG/ML ~~LOC~~ SOAJ: 1.0000 "pen " | SUBCUTANEOUS | 11 refills | Status: DC

## 2019-06-02 NOTE — Telephone Encounter (Signed)
Pt c/o medication issue:  1. Name of Medication: Evolocumab (REPATHA SURECLICK) [id number] MG/ML SOAJ  2. How are you currently taking this medication (dosage and times per day)? Inject 1 pen into the skin every 14 (fourteen) days 3. Are you having a reaction (difficulty breathing--STAT)? No  4. What is your medication issue? Patient states she is experiencing severe joint and muscle pain. Patient states it feels like fibromyalgia.

## 2019-06-02 NOTE — Telephone Encounter (Signed)
Prior auth approved through 05/19/20. Rx sent to [facility] - they stated their wholesaler can't order it. Rx then sent to [facility] - confirmed they can fill rx. [place] provided to their pharmacy for $0 copay. Pt is aware and will call with any issues tolerating. Moved f/u labs that were scheduled for Monday to March when pt sees Dr Acie Fredrickson.

## 2019-06-02 NOTE — Telephone Encounter (Signed)
Returned call to pt. She states 2 weeks ago she noticed flu like aches and joint pain. She skipped her Repatha dose and 3-4 days later her pain improved. She had been on injections for 4 months so far and thinks the pain was gradually getting worse.  She is already intolerant to pravastatin 40mg  daily, atorvastatin 20mg  daily, and rosuvastatin. Zetia monotherapy will not bring LDL to goal. She does have fibromyalgia at baseline.  Discussed trying Praluent or Nexlizet. She does not want to take any more pills and wishes to try lower dose of Praluent.  Will submit prior authorization for Praluent 75mg  Q2W. She prefers rx be sent to [facility] or [facility].

## 2019-06-07 ENCOUNTER — Other Ambulatory Visit: Payer: Medicare HMO

## 2019-06-07 ENCOUNTER — Ambulatory Visit: Payer: Medicare HMO | Admitting: Cardiovascular Disease

## 2019-07-20 ENCOUNTER — Ambulatory Visit: Payer: Medicare HMO | Admitting: Cardiovascular Disease

## 2019-07-20 ENCOUNTER — Other Ambulatory Visit: Payer: Medicare HMO

## 2019-08-12 ENCOUNTER — Ambulatory Visit: Payer: Medicare HMO | Attending: Internal Medicine

## 2019-08-12 DIAGNOSIS — Z23 Encounter for immunization: Secondary | ICD-10-CM

## 2019-08-12 NOTE — Progress Notes (Signed)
   Covid-19 Vaccination Clinic  Name:  Elizabeth Cherry    MRN: [id number] DOB: Jun 10, 1955  08/12/2019  [name] was observed post Covid-19 immunization for 30 minutes based on pre-vaccination screening without incident. She was provided with Vaccine Information Sheet and instruction to access the V-Safe system.   [name] was instructed to call 911 with any severe reactions post vaccine: Marland Kitchen Difficulty breathing  . Swelling of face and throat  . A fast heartbeat  . A bad rash all over body  . Dizziness and weakness   Immunizations Administered    Name Date Dose VIS Date Route   Moderna COVID-19 Vaccine 08/12/2019 12:40 PM 0.5 mL 04/20/2019 Intramuscular   Manufacturer: Moderna   Lot: [id number]   [id number]

## 2019-09-15 ENCOUNTER — Ambulatory Visit: Payer: Medicare HMO | Attending: Internal Medicine

## 2019-09-15 DIAGNOSIS — Z23 Encounter for immunization: Secondary | ICD-10-CM

## 2019-09-15 NOTE — Progress Notes (Signed)
   Covid-19 Vaccination Clinic  Name:  Elizabeth Cherry    MRN: [id number] DOB: 03-Sep-1955  09/15/2019  Ms. Elizabeth Cherry was observed post Covid-19 immunization for 30 minutes based on pre-vaccination screening without incident. She was provided with Vaccine Information Sheet and instruction to access the V-Safe system.   Ms. Elizabeth Cherry was instructed to call 911 with any severe reactions post vaccine: Marland Kitchen Difficulty breathing  . Swelling of face and throat  . A fast heartbeat  . A bad rash all over body  . Dizziness and weakness   Immunizations Administered    Name Date Dose VIS Date Route   Moderna COVID-19 Vaccine 09/15/2019 11:35 AM 0.5 mL 04/2019 Intramuscular   Manufacturer: Moderna   Lot: [id number]   [place]:[id number]

## 2019-09-20 ENCOUNTER — Telehealth: Payer: Self-pay | Admitting: Cardiovascular Disease

## 2019-09-20 MED ORDER — NEXLIZET 180-10 MG PO TABS: 1.0000 | ORAL_TABLET | Freq: Every day | ORAL | 11 refills | Status: DC

## 2019-09-20 NOTE — Telephone Encounter (Addendum)
Returned call to pt. She states she stopped her Praluent 1 month ago due to worsening joint pain and edema in her ankles. Similar experience with Repatha. She previously experienced muscle pain on pravastatin 40mg  daily, atorvastatin 20mg  daily, and rosuvastatin. Allergy list has been updated.  Discussed trying Nexlizet. Pt is willing to try this. Prior authorization submitted and approved through 05/19/20. Rx sent to [facility] per pt preference. [facility] still active for $0 copay. She will call with any trouble tolerating therapy. Otherwise, will plan to check lipids in 3 months to assess efficacy.

## 2019-09-20 NOTE — Telephone Encounter (Signed)
Pt c/o medication issue:  1. Name of Medication: Alirocumab ([place]) 75 MG/ML SOAJ  2. How are you currently taking this medication (dosage and times per day)? Pt is not using the medication   3. Are you having a reaction (difficulty breathing--STAT)?   4. What is your medication issue? Pt feels like the shot makes her fibromyalgia flare up.

## 2019-09-20 NOTE — Telephone Encounter (Signed)
The patient reports that she has not been taking her Alirocumab (PRALUENT) 75 MG/ML  For about a month now d/t it causing her fibromyalgia to flare up. She did not tolerate Repatha and eventually had to stop taking it so when she noticed similar symptoms beginning to appear she decided to stop taking the Praluent as well because she did not want things to get worse. She states that statins cause her joints to hurt and her ankles to swell. She does not want to be on any kind of statin any more. Will route to Pharmd/Nah[facility] for advisement.

## 2019-09-21 ENCOUNTER — Ambulatory Visit: Payer: Medicare HMO | Admitting: Cardiovascular Disease

## 2019-09-21 ENCOUNTER — Other Ambulatory Visit: Payer: Medicare HMO

## 2019-09-23 ENCOUNTER — Other Ambulatory Visit: Payer: Self-pay

## 2019-09-23 MED ORDER — ANASTROZOLE 1 MG PO TABS: ORAL_TABLET | ORAL | 0 refills | Status: DC

## 2019-09-24 ENCOUNTER — Ambulatory Visit: Payer: Medicare HMO | Admitting: Hematology and Oncology

## 2019-09-24 DIAGNOSIS — F4310 Post-traumatic stress disorder, unspecified: Secondary | ICD-10-CM | POA: Diagnosis not present

## 2019-09-24 DIAGNOSIS — F339 Major depressive disorder, recurrent, unspecified: Secondary | ICD-10-CM | POA: Diagnosis not present

## 2019-09-24 DIAGNOSIS — F1021 Alcohol dependence, in remission: Secondary | ICD-10-CM | POA: Diagnosis not present

## 2019-09-24 DIAGNOSIS — G4700 Insomnia, unspecified: Secondary | ICD-10-CM | POA: Diagnosis not present

## 2019-09-27 ENCOUNTER — Other Ambulatory Visit: Payer: Self-pay

## 2019-09-27 DIAGNOSIS — G4700 Insomnia, unspecified: Secondary | ICD-10-CM | POA: Diagnosis not present

## 2019-09-27 DIAGNOSIS — F4310 Post-traumatic stress disorder, unspecified: Secondary | ICD-10-CM | POA: Diagnosis not present

## 2019-09-27 DIAGNOSIS — F1021 Alcohol dependence, in remission: Secondary | ICD-10-CM | POA: Diagnosis not present

## 2019-09-27 DIAGNOSIS — F339 Major depressive disorder, recurrent, unspecified: Secondary | ICD-10-CM | POA: Diagnosis not present

## 2019-09-28 ENCOUNTER — Encounter: Payer: Self-pay | Admitting: Internal Medicine

## 2019-09-28 ENCOUNTER — Ambulatory Visit (INDEPENDENT_AMBULATORY_CARE_PROVIDER_SITE_OTHER): Payer: Medicare HMO | Admitting: Internal Medicine

## 2019-09-28 VITALS — BP 110/80 | HR 106 | Temp 97.8°F | Ht 65.0 in | Wt 177.0 lb

## 2019-09-28 DIAGNOSIS — J449 Chronic obstructive pulmonary disease, unspecified: Secondary | ICD-10-CM | POA: Diagnosis not present

## 2019-09-28 DIAGNOSIS — Z1211 Encounter for screening for malignant neoplasm of colon: Secondary | ICD-10-CM

## 2019-09-28 DIAGNOSIS — I10 Essential (primary) hypertension: Secondary | ICD-10-CM

## 2019-09-28 DIAGNOSIS — C50211 Malignant neoplasm of upper-inner quadrant of right female breast: Secondary | ICD-10-CM | POA: Diagnosis not present

## 2019-09-28 DIAGNOSIS — Z170 Estrogen receptor positive status [ER+]: Secondary | ICD-10-CM

## 2019-09-28 DIAGNOSIS — E782 Mixed hyperlipidemia: Secondary | ICD-10-CM

## 2019-09-28 DIAGNOSIS — F1021 Alcohol dependence, in remission: Secondary | ICD-10-CM

## 2019-09-28 DIAGNOSIS — F17200 Nicotine dependence, unspecified, uncomplicated: Secondary | ICD-10-CM | POA: Diagnosis not present

## 2019-09-28 DIAGNOSIS — F332 Major depressive disorder, recurrent severe without psychotic features: Secondary | ICD-10-CM | POA: Diagnosis not present

## 2019-09-28 MED ORDER — ALBUTEROL SULFATE HFA 108 (90 BASE) MCG/ACT IN AERS: 1.0000 | INHALATION_SPRAY | Freq: Four times a day (QID) | RESPIRATORY_TRACT | 2 refills | Status: DC | PRN

## 2019-09-28 MED ORDER — BREO ELLIPTA 200-25 MCG/INH IN AEPB: 1.0000 | INHALATION_SPRAY | Freq: Every day | RESPIRATORY_TRACT | 2 refills | Status: DC | PRN

## 2019-09-28 MED ORDER — TRAMADOL HCL 50 MG PO TABS: 50.0000 mg | ORAL_TABLET | Freq: Two times a day (BID) | ORAL | 0 refills | Status: DC

## 2019-09-28 MED ORDER — HYDROXYZINE PAMOATE 50 MG PO CAPS: 100.0000 mg | ORAL_CAPSULE | Freq: Every day | ORAL | 1 refills | Status: AC

## 2019-09-28 MED ORDER — PANTOPRAZOLE SODIUM 40 MG PO TBEC: 40.0000 mg | DELAYED_RELEASE_TABLET | Freq: Every day | ORAL | 1 refills | Status: DC

## 2019-09-28 MED ORDER — BUDESONIDE-FORMOTEROL FUMARATE 160-4.5 MCG/ACT IN AERO: 2.0000 | INHALATION_SPRAY | Freq: Every day | RESPIRATORY_TRACT | 2 refills | Status: DC | PRN

## 2019-09-28 MED ORDER — ALENDRONATE SODIUM 70 MG PO TABS: 70.0000 mg | ORAL_TABLET | ORAL | 2 refills | Status: DC

## 2019-09-28 MED ORDER — SPIRIVA HANDIHALER 18 MCG IN CAPS: 18.0000 ug | ORAL_CAPSULE | Freq: Every day | RESPIRATORY_TRACT | 1 refills | Status: DC

## 2019-09-28 NOTE — Progress Notes (Signed)
New Patient Office Visit     This visit occurred during the SARS-CoV-2 public health emergency.  Safety protocols were in place, including screening questions prior to the visit, additional usage of staff PPE, and extensive cleaning of exam room while observing appropriate contact time as indicated for disinfecting solutions.    CC/Reason for Visit: Establish care, discuss chronic conditions, medication refills Previous PCP: Adriana Reams, NP Last Visit: September 2020  HPI: Elizabeth Cherry is a 64 y.o. female who is coming in today for the above mentioned reasons.  She has a complex past medical history significant for: Right breast cancer in 2016 status post lumpectomy and radiation now on Arimidex followed by Dr. Lindi Adie.  Has a history of tobacco use disorder, has been smoking a pack plus a day since age 37, she was a prior alcoholic who quit in 0000000.  She has a history of hypertension and right carotid artery stenosis followed by Dr. Acie Fredrickson, history of COPD not followed by pulmonologist on Symbicort, Spiriva, Breo and as needed albuterol.  She has a history of GERD/gastritis on Protonix, history of hyperlipidemia with severe statin intolerance, and most significantly she has a history of depression and anxiety closely followed by psychiatry.  She has had several hospitalizations for suicidal ideation.  She has no family in town, her mother passed away in 23-May-2023 from end-stage Alzheimer, her father died at age 30 from coronary artery disease.  She has allergies to statins and multiple other medications.   Past Medical/Surgical History: Past Medical History:  Diagnosis Date  . Alcoholism ([place])    IN REMISSION ALMOST 20 YEARS  . Allergic rhinitis   . Anorectal hemorrhage   . Anxiety   . Asthma   . Benign neoplasm of colon 10/24/2015  . Borderline personality disorder ([place])   . Breast cancer of upper-inner quadrant of right female breast ([place]) 01/08/2016  . Carotid stenosis,  bilateral   . Chronic gastritis without bleeding    OTHER, WITHOUT HEMORRHAGE  . Chronic obstructive pulmonary disease ([place]) 11/15/2011  . Chronic pain 10/24/2011   Overview:  Managed by [facility]. Gets Fentanyl Patches - sees them every other month.   Marland Kitchen COPD (chronic obstructive pulmonary disease) ([place])   . Depression   . Dyslipidemia   . Edema    due to psychiatric meds per patient, on a diuretic  . Elevated cholesterol   . Fibromyalgia   . Generalized anxiety disorder   . GERD (gastroesophageal reflux disease)   . Hypertension    no meds now  . Irritable bowel syndrome    UNSPECIFIED TYPE  . Malignant neoplasm of unspecified site of unspecified female breast ([place])   . Mixed hyperlipidemia   . Multiple gastric ulcers   . Personal history of colonic polyps   . Psoriasis 02/11/2012  . Sciatica   . Self neglect 10/23/2015  . Severe recurrent major depression without psychotic features ([place]) 10/23/2015  . Tobacco dependence     Past Surgical History:  Procedure Laterality Date  . BREAST LUMPECTOMY WITH RADIOACTIVE SEED AND SENTINEL LYMPH NODE BIOPSY Right 02/14/2016   Procedure: BREAST LUMPECTOMY WITH RADIOACTIVE SEED AND SENTINEL LYMPH NODE BIOPSY;  Surgeon: Excell Seltzer, MD;  Location: [facility];  Service: General;  Laterality: Right;  . DIAGNOSTIC LAPAROSCOPY    . DILATION AND CURETTAGE OF UTERUS    . GYNECOLOGIC CRYOSURGERY      Social History:  reports that she has been smoking cigarettes.  She has been smoking about 1.50 packs per day. She has never used smokeless tobacco. She reports that she does not drink alcohol or use drugs.  Allergies: Allergies  Allergen Reactions  . Bactrim [Sulfamethoxazole-Trimethoprim] Hives  . Diazepam Other (See Comments)    Severe depression  . Fluoxetine Swelling and Anaphylaxis    Pt reports she is allergic to filler in prozac, her throat swell.  Pt reports she is allergic to filler in  prozac, her throat swell.   Pt reports she is allergic to filler in prozac, her throat swell.  Pt reports she is allergic to filler in prozac, her throat swell.  Pt reports she is allergic to filler in prozac, her throat swell.  Pt reports she is allergic to filler in prozac, her throat swell.    . Telmisartan Hives and Other (See Comments)    Other reaction(s): Other (See Comments) Other Reaction: OTHER REACTION Other Reaction: OTHER REACTION Other reaction(s): Other (See Comments) Other Reaction: OTHER REACTION  Other Reaction: OTHER REACTION Other reaction(s): Other (See Comments) Other Reaction: OTHER REACTION Other Reaction: OTHER REACTION Other reaction(s): Other (See Comments) Other Reaction: OTHER REACTION Other Reaction: OTHER REACTION Other reaction(s): Other (See Comments) Other Reaction: OTHER REACTION Other Reaction: OTHER REACTION Other reaction(s): Other (See Comments) Other Reaction: OTHER REACTION   . Tetracycline Hives and Rash  . Tetracyclines & Related Hives and Rash  . Aspirin Other (See Comments)    Gastric ulcers  Other Reaction: stomach ulcer Gastric ulcers Other Reaction: stomach ulcer Gastric ulcers   . Cefdinir Other (See Comments)    Other reaction(s): Other (See Comments) Other Reaction: EXTREME EXHAUSTION Other Reaction: EXTREME EXHAUSTION Other reaction(s): Other (See Comments) Other Reaction: EXTREME EXHAUSTION  Other Reaction: EXTREME EXHAUSTION Other reaction(s): Other (See Comments) Other Reaction: EXTREME EXHAUSTION Other Reaction: EXTREME EXHAUSTION Other reaction(s): Other (See Comments) Other Reaction: EXTREME EXHAUSTION Other Reaction: EXTREME EXHAUSTION Other reaction(s): Other (See Comments) Other Reaction: EXTREME EXHAUSTION Other Reaction: EXTREME EXHAUSTION Other reaction(s): Other (See Comments) Other Reaction: EXTREME EXHAUSTION   . Clarithromycin Other (See Comments)    unknown  . Nsaids Other (See Comments)     Gastric ulcers  Other Reaction: stomach ulcer Other Reaction: stomach ulcer  Gastric ulcers  . Risperidone Nausea And Vomiting  . Trazodone Other (See Comments)    Dry eyes Dry eyes Dry eyes  Dry eyes Dry eyes Dry eyes Dry eyes Dry eyes Dry eyes Dry eyes Dry eyes   . Atorvastatin     20mg  caused myalgias  . Praluent [Alirocumab]     Joint pain  . Pravastatin     40mg  caused myalgias  . Repatha [Evolocumab]     Myalgias and flu like symptoms  . Rosuvastatin     myalgias  . Oxycodone Nausea Only    Family History:  Family History  Problem Relation Age of Onset  . Colon cancer Maternal Grandmother      Current Outpatient Medications:  .  acetaminophen (TYLENOL) 500 MG tablet, Take 1,000 mg by mouth every 6 (six) hours as needed., Disp: , Rfl:  .  albuterol (VENTOLIN HFA) 108 (90 Base) MCG/ACT inhaler, Inhale 1-2 puffs into the lungs every 6 (six) hours as needed for wheezing or shortness of breath., Disp: 18 g, Rfl: 2 .  alendronate (FOSAMAX) 70 MG tablet, Take 1 tablet (70 mg total) by mouth once a week. full glass of water on an empty stomach., Disp: 12 tablet, Rfl: 2 .  anastrozole (ARIMIDEX) 1 MG tablet,  TAKE (1) TABLET BY MOUTH ONCE DAILY., Disp: 90 tablet, Rfl: 0 .  BREO ELLIPTA 200-25 MCG/INH AEPB, Inhale 1 puff into the lungs daily as needed., Disp: 60 each, Rfl: 2 .  budesonide-formoterol (SYMBICORT) 160-4.5 MCG/ACT inhaler, Inhale 2 puffs into the lungs daily as needed., Disp: 1 Inhaler, Rfl: 2 .  clonazePAM (KLONOPIN) 0.5 MG tablet, Take 1 tablet (0.5 mg total) by mouth 3 (three) times daily as needed (anxiety or sleep)., Disp: 90 tablet, Rfl: 0 .  HYDROCHLOROTHIAZIDE PO, Take by mouth., Disp: , Rfl:  .  hydrOXYzine (VISTARIL) 50 MG capsule, Take 2 capsules (100 mg total) by mouth at bedtime., Disp: 90 capsule, Rfl: 1 .  Montelukast Sodium (SINGULAIR PO), Take by mouth., Disp: , Rfl:  .  pantoprazole (PROTONIX) 40 MG tablet, Take 1 tablet (40 mg total) by  mouth daily., Disp: 90 tablet, Rfl: 1 .  Probiotic Product (SOLUBLE FIBER/PROBIOTICS PO), Take by mouth., Disp: , Rfl:  .  QUEtiapine (SEROQUEL) 100 MG tablet, Take 100 mg by mouth at bedtime. 150-200mg , Disp: , Rfl:  .  sertraline (ZOLOFT) 100 MG tablet, Take 150 mg by mouth daily., Disp: , Rfl:  .  tiotropium (SPIRIVA HANDIHALER) 18 MCG inhalation capsule, Place 1 capsule (18 mcg total) into inhaler and inhale daily., Disp: 90 capsule, Rfl: 1 .  traMADol (ULTRAM) 50 MG tablet, Take 1 tablet (50 mg total) by mouth 2 (two) times daily., Disp: 60 tablet, Rfl: 0 .  Bempedoic Acid-Ezetimibe (NEXLIZET) 180-10 MG TABS, Take 1 tablet by mouth daily. (Patient not taking: Reported on 09/28/2019), Disp: 30 tablet, Rfl: 11  Review of Systems:  Constitutional: Denies fever, chills, diaphoresis, appetite change and fatigue.  HEENT: Denies photophobia, eye pain, redness, hearing loss, ear pain, congestion, sore throat, rhinorrhea, sneezing, mouth sores, trouble swallowing, neck pain, neck stiffness and tinnitus.   Respiratory: Denies SOB, DOE, cough, chest tightness,  and wheezing.   Cardiovascular: Denies chest pain, palpitations and leg swelling.  Gastrointestinal: Denies nausea, vomiting, abdominal pain, diarrhea, constipation, blood in stool and abdominal distention.  Genitourinary: Denies dysuria, urgency, frequency, hematuria, flank pain and difficulty urinating.  Endocrine: Denies: hot or cold intolerance, sweats, changes in hair or nails, polyuria, polydipsia. Musculoskeletal: Denies myalgias, back pain, joint swelling, arthralgias and gait problem.  Skin: Denies pallor, rash and wound.  Neurological: Denies dizziness, seizures, syncope, weakness, light-headedness, numbness and headaches.  Hematological: Denies adenopathy. Easy bruising, personal or family bleeding history  Psychiatric/Behavioral: Denies suicidal ideation, mood changes, confusion, nervousness, sleep disturbance and  agitation    Physical Exam: Vitals:   09/28/19 1404  BP: 110/80  Pulse: (!) 106  Temp: 97.8 F (36.6 C)  TempSrc: Temporal  SpO2: 98%  Weight: 177 lb (80.3 kg)  Height: 5\' 5"  (1.651 m)   Body mass index is 29.45 kg/m.  Constitutional: NAD, calm, comfortable Eyes: PERRL, lids and conjunctivae normal ENMT: Mucous membranes are moist.  Respiratory: clear to auscultation bilaterally, no wheezing, no crackles. Normal respiratory effort. No accessory muscle use.  Cardiovascular: Regular rate and rhythm, no murmurs / rubs / gallops. No extremity edema.  Neurologic: Grossly intact and nonfocal Psychiatric: Normal judgment and insight. Alert and oriented x 3. Normal mood.    Impression and Plan:  Screening for malignant neoplasm of colon  - Plan: Ambulatory referral to Gastroenterology  Mixed hyperlipidemia -Last LDL on file was 169, unfortunately she has severe statin-induced myalgias.  Malignant neoplasm of upper-inner quadrant of right breast in female, estrogen receptor positive ([place]) -Currently on  Arimidex followed by oncology.  Tobacco use disorder -We have not had time to discuss this today, will continue to address at subsequent visits.  Alcohol use disorder, severe, in sustained remission [facility]) -Per report, has been sober since 1997.  Severe recurrent major depression without psychotic features ([place]) -Followed closely by psychiatry and therapist on multiple psychotropic medications.  Essential hypertension -Well-controlled on current regimen.  Chronic obstructive pulmonary disease, unspecified COPD type ([place]) -Not followed by pulmonary, refill inhalers today.  Chronic pain -PDMP reviewed, she has a red flag for greater than 5 providers however these are all for psychotropic medications. -I have agreed to refill tramadol 60 a month for 3 months, she has signed a pain contract with me today.      Lelon Frohlich, MD [facility] at  [facility]

## 2019-10-05 NOTE — Telephone Encounter (Signed)
Called pt to follow up with her lipids. She states she has not started Nexlizet yet because she read about the side effects and was afraid to take it. Encouraged pt to try Nexlizet as she is already intolerant to pravastatin 40mg  daily, atorvastatin 20mg  daily, rosuvastatin, Praluent, and Repatha and lipid lowering options are limited. She stated she would try the Nexlizet and let us know how she tolerates it.

## 2019-10-18 ENCOUNTER — Encounter: Payer: Self-pay | Admitting: Cardiovascular Disease

## 2019-10-18 NOTE — Progress Notes (Deleted)
Cardiology Office Note:    Date:  10/18/2019   ID:  Hilbert Bible, DOB 12/01/1955, MRN [id number]  PCP:  Isaac Bliss, Rayford Halsted, MD  Cardiologist:  Shalla Bulluck  Electrophysiologist:  None   Referring MD: Jettie Booze, NP   Chief Complaint  Patient presents with  . Hyperlipidemia    Problem List 1.   Carotid disease - 50% RICA stenosis  2.  Hypertension 3.  Hyperlipidemia     December 08, 2018     Elizabeth Cherry is a 64 y.o. female with a hx of hyperlipidemia and carotid artery disease.  Lots and lots and lots of anxiety issues !   Transfer from Dr. Einar Gip  occasionall CP when she is stressed.   Has had Lexiscan myoivew at Dr. Irven Shelling office.   Was normal .  Does not get any regular exercise  ( does not want to exercise,  Is on disability )     She has no interest in exercising.   Still smoking ( 1-2 ppd )  Has tried CBD gummies which helps her not smoke .   History of hyperlipidemia.  She states that all the statins that she has tried has made her feel like fibromyalgia issues.  She is unwilling to restart statin medication.  October 19, 2019:  Gelene is seen for follow up of her hyperlipidemia, carotid artery disease,  Cigarette smoking,  Does not exerciwe     Past Medical History:  Diagnosis Date  . Alcoholism ([place])    IN REMISSION ALMOST 20 YEARS  . Allergic rhinitis   . Anorectal hemorrhage   . Anxiety   . Asthma   . Benign neoplasm of colon 10/24/2015  . Borderline personality disorder ([place])   . Breast cancer of upper-inner quadrant of right female breast ([place]) 01/08/2016  . Carotid stenosis, bilateral   . Chronic gastritis without bleeding    OTHER, WITHOUT HEMORRHAGE  . Chronic obstructive pulmonary disease ([place]) 11/15/2011  . Chronic pain 10/24/2011   Overview:  Managed by [facility]. Gets Fentanyl Patches - sees them every other month.   Marland Kitchen COPD (chronic obstructive pulmonary disease) ([place])   . Depression   .  Dyslipidemia   . Edema    due to psychiatric meds per patient, on a diuretic  . Elevated cholesterol   . Fibromyalgia   . Generalized anxiety disorder   . GERD (gastroesophageal reflux disease)   . Hypertension    no meds now  . Irritable bowel syndrome    UNSPECIFIED TYPE  . Malignant neoplasm of unspecified site of unspecified female breast ([place])   . Mixed hyperlipidemia   . Multiple gastric ulcers   . Personal history of colonic polyps   . Psoriasis 02/11/2012  . Sciatica   . Self neglect 10/23/2015  . Severe recurrent major depression without psychotic features ([place]) 10/23/2015  . Tobacco dependence     Past Surgical History:  Procedure Laterality Date  . BREAST LUMPECTOMY WITH RADIOACTIVE SEED AND SENTINEL LYMPH NODE BIOPSY Right 02/14/2016   Procedure: BREAST LUMPECTOMY WITH RADIOACTIVE SEED AND SENTINEL LYMPH NODE BIOPSY;  Surgeon: Excell Seltzer, MD;  Location: [facility];  Service: General;  Laterality: Right;  . DIAGNOSTIC LAPAROSCOPY    . DILATION AND CURETTAGE OF UTERUS    . GYNECOLOGIC CRYOSURGERY      Current Medications: No outpatient medications have been marked as taking for the 10/19/19 encounter (Office Visit) with Dijon Cosens, Wonda Cheng, MD.  Allergies:   Bactrim [sulfamethoxazole-trimethoprim], Diazepam, Fluoxetine, Telmisartan, Tetracycline, Tetracyclines & related, Aspirin, Cefdinir, Clarithromycin, Nsaids, Risperidone, Trazodone, Atorvastatin, Praluent [alirocumab], Pravastatin, Repatha [evolocumab], Rosuvastatin, and Oxycodone   Social History   Socioeconomic History  . Marital status: Single    Spouse name: Not on file  . Number of children: Not on file  . Years of education: Not on file  . Highest education level: Not on file  Occupational History  . Not on file  Tobacco Use  . Smoking status: Current Every Day Smoker    Packs/day: 1.50    Types: Cigarettes  . Smokeless tobacco: Never Used  Substance and Sexual Activity  . Alcohol  use: No  . Drug use: No  . Sexual activity: Not on file  Other Topics Concern  . Not on file  Social History Narrative  . Not on file   Social Determinants of Health   Financial Resource Strain:   . Difficulty of Paying Living Expenses:   Food Insecurity:   . Worried About Charity fundraiser in the Last Year:   . Arboriculturist in the Last Year:   Transportation Needs:   . Film/video editor (Medical):   Marland Kitchen Lack of Transportation (Non-Medical):   Physical Activity:   . Days of Exercise per Week:   . Minutes of Exercise per Session:   Stress:   . Feeling of Stress :   Social Connections:   . Frequency of Communication with Friends and Family:   . Frequency of Social Gatherings with Friends and Family:   . Attends Religious Services:   . Active Member of Clubs or Organizations:   . Attends Archivist Meetings:   Marland Kitchen Marital Status:      Family History: The patient's family history includes Colon cancer in her maternal grandmother.  ROS:   Please see the history of present illness.     All other systems reviewed and are negative.  EKGs/Labs/Other Studies Reviewed:    The following studies were reviewed today:  Recent Labs: 12/08/2018: BUN 9; Creatinine, Ser 1.07; Potassium 3.3; Sodium 133 03/29/2019: ALT 29  Recent Lipid Panel    Component Value Date/Time   CHOL 199 03/29/2019 1157   TRIG 218 (H) 03/29/2019 1157   HDL 51 03/29/2019 1157   CHOLHDL 3.9 03/29/2019 1157   CHOLHDL 3.7 10/24/2015 1712   VLDL 25 10/24/2015 1712   LDLCALC 110 (H) 03/29/2019 1157    Physical Exam:    Physical Exam: There were no vitals taken for this visit.  GEN:  Well nourished, well developed in no acute distress HEENT: Normal NECK: No JVD; No carotid bruits LYMPHATICS: No lymphadenopathy CARDIAC: RRR ***, no murmurs, rubs, gallops RESPIRATORY:  Clear to auscultation without rales, wheezing or rhonchi  ABDOMEN: Soft, non-tender, non-distended MUSCULOSKELETAL:  No  edema; No deformity  SKIN: Warm and dry NEUROLOGIC:  Alert and oriented x 3    ECG:  ASSESSMENT:    No diagnosis found. PLAN:    In order of problems listed above:  1. Hyperlipidemia:    2.  Hypertension:    2.  Carotid artery disease:    4.  Cigarette smoking:    Medication Adjustments/Labs and Tests Ordered: Current medicines are reviewed at length with the patient today.  Concerns regarding medicines are outlined above.  No orders of the defined types were placed in this encounter.  No orders of the defined types were placed in this encounter.   There are no  Patient Instructions on file for this visit.   Signed, Mertie Moores, MD  10/18/2019 9:35 PM    [facility]

## 2019-10-19 ENCOUNTER — Encounter: Payer: Medicare HMO | Admitting: Cardiovascular Disease

## 2019-10-21 NOTE — Progress Notes (Signed)
This encounter was created in error - please disregard.

## 2019-11-01 ENCOUNTER — Other Ambulatory Visit: Payer: Self-pay | Admitting: Internal Medicine

## 2019-11-01 DIAGNOSIS — G4700 Insomnia, unspecified: Secondary | ICD-10-CM | POA: Diagnosis not present

## 2019-11-01 DIAGNOSIS — F4310 Post-traumatic stress disorder, unspecified: Secondary | ICD-10-CM | POA: Diagnosis not present

## 2019-11-01 DIAGNOSIS — F1021 Alcohol dependence, in remission: Secondary | ICD-10-CM | POA: Diagnosis not present

## 2019-11-01 DIAGNOSIS — F339 Major depressive disorder, recurrent, unspecified: Secondary | ICD-10-CM | POA: Diagnosis not present

## 2019-11-02 ENCOUNTER — Telehealth: Payer: Self-pay | Admitting: Internal Medicine

## 2019-11-02 MED ORDER — TRAMADOL HCL 50 MG PO TABS: 50.0000 mg | ORAL_TABLET | Freq: Two times a day (BID) | ORAL | 0 refills | Status: DC

## 2019-11-02 NOTE — Telephone Encounter (Signed)
Pt called back and stated the pharmacy received a 2nd denial. Informed pt that no medications were called in since my last note that was sent back. Spoke to [place] and she informed me that she is going to call the pharmacy. Spoke to pt about CMA calling pharmacy. Pt understood nothing further needed

## 2019-11-02 NOTE — Telephone Encounter (Signed)
Please deny - duplicate request

## 2019-11-02 NOTE — Telephone Encounter (Signed)
Refill called in. 

## 2019-11-02 NOTE — Telephone Encounter (Signed)
Pt called the pharmacy and they stated that they received a denial for the px. Pt thinks its because she sent in a second request and the second one was denied but the first was approved. Pt is out of this medication and wonders if PCP can resend it again.

## 2019-11-09 ENCOUNTER — Ambulatory Visit: Payer: Medicare HMO | Admitting: Cardiovascular Disease

## 2019-11-23 ENCOUNTER — Telehealth: Payer: Self-pay | Admitting: Internal Medicine

## 2019-11-23 NOTE — Telephone Encounter (Signed)
BREO ELLIPTA 200-25 MCG/INH AEPB  Patient needs a different inhaler then this one because this one is giving her thrush down her throat and on her tongue.  She has been rinsing her mouth out after using the [facility] but she is still getting thrush.  [place], [place] Phone:  [phone]  Fax:  [phone]

## 2019-11-24 DIAGNOSIS — F1021 Alcohol dependence, in remission: Secondary | ICD-10-CM | POA: Diagnosis not present

## 2019-11-24 DIAGNOSIS — F4310 Post-traumatic stress disorder, unspecified: Secondary | ICD-10-CM | POA: Diagnosis not present

## 2019-11-24 DIAGNOSIS — F339 Major depressive disorder, recurrent, unspecified: Secondary | ICD-10-CM | POA: Diagnosis not present

## 2019-11-24 DIAGNOSIS — G4700 Insomnia, unspecified: Secondary | ICD-10-CM | POA: Diagnosis not present

## 2019-11-24 NOTE — Telephone Encounter (Signed)
All inhalers with steroid can cause thrush if not diligent with mouth rinsing afterwards. Any substitutions would have the same issue. She can rinse and brush teeth/use mouthwash after inhaler is used.

## 2019-11-24 NOTE — Telephone Encounter (Signed)
Patient is aware.  Patient also complains of weight loss.  An appointment was offered, but patient declined.

## 2019-11-25 ENCOUNTER — Other Ambulatory Visit: Payer: Self-pay | Admitting: Internal Medicine

## 2019-11-25 MED ORDER — TRAMADOL HCL 50 MG PO TABS: 50.0000 mg | ORAL_TABLET | Freq: Two times a day (BID) | ORAL | 0 refills | Status: DC

## 2019-11-26 ENCOUNTER — Telehealth (INDEPENDENT_AMBULATORY_CARE_PROVIDER_SITE_OTHER): Payer: Medicare HMO | Admitting: Internal Medicine

## 2019-11-26 DIAGNOSIS — B370 Candidal stomatitis: Secondary | ICD-10-CM | POA: Diagnosis not present

## 2019-11-26 NOTE — Progress Notes (Signed)
Virtual Visit via Video Note  I connected with Elizabeth Cherry on 11/26/19 at  4:00 PM EDT by a video enabled telemedicine application and verified that I am speaking with the correct person using two identifiers.  Location patient: home Location provider: work office Persons participating in the virtual visit: patient, provider  I discussed the limitations of evaluation and management by telemedicine and the availability of in person appointments. The patient expressed understanding and agreed to proceed.   HPI: She wanted some advice in regards to her Breo Ellipta inhaler and thrush.  She has been on Breo for years, in the past 2 weeks developed oral thrush.  She saw the dentist who gave her 15 days of fluconazole which she has yet to complete.  She would like to know what she can do to prevent thrush in the future.   ROS: Constitutional: Denies fever, chills, diaphoresis, appetite change and fatigue.  HEENT: Denies photophobia, eye pain, redness, hearing loss, ear pain, congestion, sore throat, rhinorrhea, sneezing, mouth sores, trouble swallowing, neck pain, neck stiffness and tinnitus.   Respiratory: Denies SOB, DOE, cough, chest tightness,  and wheezing.   Cardiovascular: Denies chest pain, palpitations and leg swelling.  Gastrointestinal: Denies nausea, vomiting, abdominal pain, diarrhea, constipation, blood in stool and abdominal distention.  Genitourinary: Denies dysuria, urgency, frequency, hematuria, flank pain and difficulty urinating.  Endocrine: Denies: hot or cold intolerance, sweats, changes in hair or nails, polyuria, polydipsia. Musculoskeletal: Denies myalgias, back pain, joint swelling, arthralgias and gait problem.  Skin: Denies pallor, rash and wound.  Neurological: Denies dizziness, seizures, syncope, weakness, light-headedness, numbness and headaches.  Hematological: Denies adenopathy. Easy bruising, personal or family bleeding history   Psychiatric/Behavioral: Denies suicidal ideation, mood changes, confusion, nervousness, sleep disturbance and agitation   Past Medical History:  Diagnosis Date  . Alcoholism ([place])    IN REMISSION ALMOST 20 YEARS  . Allergic rhinitis   . Anorectal hemorrhage   . Anxiety   . Asthma   . Benign neoplasm of colon 10/24/2015  . Borderline personality disorder ([place])   . Breast cancer of upper-inner quadrant of right female breast ([place]) 01/08/2016  . Carotid stenosis, bilateral   . Chronic gastritis without bleeding    OTHER, WITHOUT HEMORRHAGE  . Chronic obstructive pulmonary disease ([place]) 11/15/2011  . Chronic pain 10/24/2011   Overview:  Managed by [facility]. Gets Fentanyl Patches - sees them every other month.   Marland Kitchen COPD (chronic obstructive pulmonary disease) ([place])   . Depression   . Dyslipidemia   . Edema    due to psychiatric meds per patient, on a diuretic  . Elevated cholesterol   . Fibromyalgia   . Generalized anxiety disorder   . GERD (gastroesophageal reflux disease)   . Hypertension    no meds now  . Irritable bowel syndrome    UNSPECIFIED TYPE  . Malignant neoplasm of unspecified site of unspecified female breast ([place])   . Mixed hyperlipidemia   . Multiple gastric ulcers   . Personal history of colonic polyps   . Psoriasis 02/11/2012  . Sciatica   . Self neglect 10/23/2015  . Severe recurrent major depression without psychotic features ([place]) 10/23/2015  . Tobacco dependence     Past Surgical History:  Procedure Laterality Date  . BREAST LUMPECTOMY WITH RADIOACTIVE SEED AND SENTINEL LYMPH NODE BIOPSY Right 02/14/2016   Procedure: BREAST LUMPECTOMY WITH RADIOACTIVE SEED AND SENTINEL LYMPH NODE BIOPSY;  Surgeon: Excell Seltzer, MD;  Location: [facility];  Service: General;  Laterality: Right;  . DIAGNOSTIC LAPAROSCOPY    . DILATION AND CURETTAGE OF UTERUS    . GYNECOLOGIC CRYOSURGERY      Family History  Problem Relation Age  of Onset  . Colon cancer Maternal Grandmother     SOCIAL HX:   reports that she has been smoking cigarettes. She has been smoking about 1.50 packs per day. She has never used smokeless tobacco. She reports that she does not drink alcohol and does not use drugs.   Current Outpatient Medications:  .  acetaminophen (TYLENOL) 500 MG tablet, Take 1,000 mg by mouth every 6 (six) hours as needed., Disp: , Rfl:  .  albuterol (VENTOLIN HFA) 108 (90 Base) MCG/ACT inhaler, Inhale 1-2 puffs into the lungs every 6 (six) hours as needed for wheezing or shortness of breath., Disp: 18 g, Rfl: 2 .  alendronate (FOSAMAX) 70 MG tablet, Take 1 tablet (70 mg total) by mouth once a week. full glass of water on an empty stomach., Disp: 12 tablet, Rfl: 2 .  anastrozole (ARIMIDEX) 1 MG tablet, TAKE (1) TABLET BY MOUTH ONCE DAILY., Disp: 90 tablet, Rfl: 0 .  Bempedoic Acid-Ezetimibe (NEXLIZET) 180-10 MG TABS, Take 1 tablet by mouth daily. (Patient not taking: Reported on 09/28/2019), Disp: 30 tablet, Rfl: 11 .  BREO ELLIPTA 200-25 MCG/INH AEPB, Inhale 1 puff into the lungs daily as needed., Disp: 60 each, Rfl: 2 .  budesonide-formoterol (SYMBICORT) 160-4.5 MCG/ACT inhaler, Inhale 2 puffs into the lungs daily as needed., Disp: 1 Inhaler, Rfl: 2 .  clonazePAM (KLONOPIN) 0.5 MG tablet, Take 1 tablet (0.5 mg total) by mouth 3 (three) times daily as needed (anxiety or sleep)., Disp: 90 tablet, Rfl: 0 .  HYDROCHLOROTHIAZIDE PO, Take by mouth., Disp: , Rfl:  .  hydrOXYzine (VISTARIL) 50 MG capsule, Take 2 capsules (100 mg total) by mouth at bedtime., Disp: 90 capsule, Rfl: 1 .  Montelukast Sodium (SINGULAIR PO), Take by mouth., Disp: , Rfl:  .  pantoprazole (PROTONIX) 40 MG tablet, Take 1 tablet (40 mg total) by mouth daily., Disp: 90 tablet, Rfl: 1 .  Probiotic Product (SOLUBLE FIBER/PROBIOTICS PO), Take by mouth., Disp: , Rfl:  .  QUEtiapine (SEROQUEL) 100 MG tablet, Take 100 mg by mouth at bedtime. 150-200mg , Disp: , Rfl:   .  sertraline (ZOLOFT) 100 MG tablet, Take 150 mg by mouth daily., Disp: , Rfl:  .  tiotropium (SPIRIVA HANDIHALER) 18 MCG inhalation capsule, Place 1 capsule (18 mcg total) into inhaler and inhale daily., Disp: 90 capsule, Rfl: 1 .  traMADol (ULTRAM) 50 MG tablet, Take 1 tablet (50 mg total) by mouth 2 (two) times daily., Disp: 60 tablet, Rfl: 0  EXAM:   VITALS per patient if applicable: None reported  GENERAL: alert, oriented, appears well and in no acute distress  HEENT: atraumatic, conjunttiva clear, no obvious abnormalities on inspection of external nose and ears  NECK: normal movements of the head and neck  LUNGS: on inspection no signs of respiratory distress, breathing rate appears normal, no obvious gross increased work of breathing, gasping or wheezing  CV: no obvious cyanosis  MS: moves all visible extremities without noticeable abnormality  PSYCH/NEURO: pleasant and cooperative, no obvious depression or anxiety, speech and thought processing grossly intact  ASSESSMENT AND PLAN:   Thrush, oral -Advised to complete out a course of fluconazole as prescribed. -She is advised to rinse her mouth fully after each dose of inhaled steroid. -Return to clinic if  further issues.    I discussed the assessment and treatment plan with the patient. The patient was provided an opportunity to ask questions and all were answered. The patient agreed with the plan and demonstrated an understanding of the instructions.   The patient was advised to call back or seek an in-person evaluation if the symptoms worsen or if the condition fails to improve as anticipated.    Lelon Frohlich, MD  [facility]

## 2019-11-29 DIAGNOSIS — F1021 Alcohol dependence, in remission: Secondary | ICD-10-CM | POA: Diagnosis not present

## 2019-11-29 DIAGNOSIS — G4700 Insomnia, unspecified: Secondary | ICD-10-CM | POA: Diagnosis not present

## 2019-11-29 DIAGNOSIS — F4310 Post-traumatic stress disorder, unspecified: Secondary | ICD-10-CM | POA: Diagnosis not present

## 2019-11-29 DIAGNOSIS — F339 Major depressive disorder, recurrent, unspecified: Secondary | ICD-10-CM | POA: Diagnosis not present

## 2019-12-07 ENCOUNTER — Ambulatory Visit: Payer: Medicare HMO | Admitting: Hematology and Oncology

## 2019-12-21 ENCOUNTER — Ambulatory Visit: Payer: Medicare HMO | Admitting: Cardiovascular Disease

## 2019-12-23 ENCOUNTER — Other Ambulatory Visit: Payer: Self-pay | Admitting: Internal Medicine

## 2019-12-23 ENCOUNTER — Ambulatory Visit (HOSPITAL_COMMUNITY)
Admission: RE | Admit: 2019-12-23 | Payer: Medicare HMO | Source: Ambulatory Visit | Attending: Cardiovascular Disease | Admitting: Cardiovascular Disease

## 2019-12-24 ENCOUNTER — Telehealth: Payer: Self-pay | Admitting: Internal Medicine

## 2019-12-24 ENCOUNTER — Encounter: Payer: Self-pay | Admitting: Internal Medicine

## 2019-12-24 ENCOUNTER — Ambulatory Visit: Payer: Medicare HMO | Admitting: Internal Medicine

## 2019-12-24 ENCOUNTER — Other Ambulatory Visit: Payer: Self-pay

## 2019-12-24 MED ORDER — CHLORTHALIDONE 25 MG PO TABS
25.0000 mg | ORAL_TABLET | Freq: Every day | ORAL | 0 refills | Status: DC
Start: 2019-12-24 — End: 2020-01-26

## 2019-12-24 MED ORDER — TRAMADOL HCL 50 MG PO TABS: 50.0000 mg | ORAL_TABLET | Freq: Two times a day (BID) | ORAL | 1 refills | Status: DC

## 2019-12-24 NOTE — Telephone Encounter (Signed)
Last OV 11/26/2019 (Virtual Visit)  Last filled 11/25/2019, # 60 with 0 refills

## 2019-12-24 NOTE — Telephone Encounter (Signed)
Pt called to cancel appt. Said she is feeling better.  Her dentist prescribed her fluconazole 08/03  Please advise

## 2019-12-24 NOTE — Telephone Encounter (Signed)
Sending as FYI 

## 2020-01-05 ENCOUNTER — Ambulatory Visit: Payer: Medicare HMO | Admitting: Internal Medicine

## 2020-01-11 DIAGNOSIS — F339 Major depressive disorder, recurrent, unspecified: Secondary | ICD-10-CM | POA: Diagnosis not present

## 2020-01-11 DIAGNOSIS — F1021 Alcohol dependence, in remission: Secondary | ICD-10-CM | POA: Diagnosis not present

## 2020-01-11 DIAGNOSIS — G4700 Insomnia, unspecified: Secondary | ICD-10-CM | POA: Diagnosis not present

## 2020-01-11 DIAGNOSIS — F4310 Post-traumatic stress disorder, unspecified: Secondary | ICD-10-CM | POA: Diagnosis not present

## 2020-01-13 ENCOUNTER — Ambulatory Visit (HOSPITAL_COMMUNITY): Payer: Medicare HMO

## 2020-01-19 ENCOUNTER — Other Ambulatory Visit: Payer: Self-pay | Admitting: *Deleted

## 2020-01-19 MED ORDER — ANASTROZOLE 1 MG PO TABS: ORAL_TABLET | ORAL | 0 refills | Status: DC

## 2020-01-20 ENCOUNTER — Inpatient Hospital Stay: Payer: Medicare HMO | Admitting: Hematology and Oncology

## 2020-01-26 ENCOUNTER — Ambulatory Visit: Payer: Medicare HMO | Admitting: Cardiovascular Disease

## 2020-01-26 ENCOUNTER — Other Ambulatory Visit: Payer: Self-pay

## 2020-01-26 MED ORDER — CHLORTHALIDONE 25 MG PO TABS: 25.0000 mg | ORAL_TABLET | Freq: Every day | ORAL | 0 refills | Status: DC

## 2020-01-26 MED ORDER — POTASSIUM CHLORIDE CRYS ER 20 MEQ PO TBCR
20.0000 meq | EXTENDED_RELEASE_TABLET | Freq: Every day | ORAL | 3 refills | Status: DC
Start: 2020-01-26 — End: 2020-02-08

## 2020-01-26 NOTE — Progress Notes (Signed)
Patient cancelled appt for today with Dr. Acie Fredrickson due to congestion, cough, and not feeling well. She has been rescheduled to 9/23. She states that her PCP will not refill her chlorthalidone and she is about to run out. She is asking if Dr. Acie Fredrickson will refill it. Per Dr. Acie Fredrickson we will refill chlorthalidone 25 mg QD and we will need to start potassium 20 mEQ QD since she has had low potassium in the past. Patient will need a BMET in 1-2 weeks. She is seeing her PCP on 9/21 she asks to have her labs done there. Will send a message to PCP to see if she can draw LIPIDS, LFTS, and BMET at 9/21 visit.

## 2020-01-29 ENCOUNTER — Encounter: Payer: Self-pay | Admitting: Internal Medicine

## 2020-01-30 NOTE — Progress Notes (Signed)
Patient Care Team: Isaac Bliss, Rayford Halsted, MD as PCP - General (Internal Medicine) Nahser, Wonda Cheng, MD as PCP - Cardiology (Cardiology) Excell Seltzer, MD (Inactive) as Consulting Physician (General Surgery) Nicholas Lose, MD as Consulting Physician (Hematology and Oncology) Kyung Rudd, MD as Consulting Physician (Radiation Oncology) Gardenia Phlegm, NP as Nurse Practitioner (Hematology and Oncology)  DIAGNOSIS:    ICD-10-CM   1. Malignant neoplasm of upper-inner quadrant of right breast in female, estrogen receptor positive ([place])  C50.211    Z17.0     SUMMARY OF ONCOLOGIC HISTORY: Oncology History  Breast cancer of upper-inner quadrant of right female breast ([place])  01/03/2016 Initial Diagnosis   Palpable right breast abnormality 1.6 cm at 2:00 position axilla negative ultrasound biopsy invasive lobular cancer grade 1, ER 95%, PR 95%, Ki-67 10%, HER-2 negative ratio 1.31, T1 cN0 stage IA clinical stage   02/14/2016 Surgery   Right lumpectomy: Invasive lobular cancer, grade 1, 1.8 cm, margins negative but close, 0/1 lymph node negative, T1 CN 0 stage IA, ER 95%, PR 95%, Ki-67 10%, HER-2 negative ratio 1.31   03/20/2016 - 05/08/2016 Radiation Therapy   Adjuvant radiation therapy at [facility]   05/20/2016 -  Anti-estrogen oral therapy   Anastrozole 1 mg by mouth daily     CHIEF COMPLIANT: Follow-up of right breast cancer on anastrozole   INTERVAL HISTORY: Elizabeth Cherry is a 64 y.o. with above-mentioned history of right breast cancer treated with lumpectomy, radiation, and who is currently on anastrozole therapy. She presents to the clinic today for annual follow-up.    ALLERGIES:  is allergic to bactrim [sulfamethoxazole-trimethoprim], diazepam, fluoxetine, telmisartan, tetracycline, tetracyclines & related, aspirin, cefdinir, clarithromycin, nsaids, risperidone, trazodone, atorvastatin, praluent [alirocumab], pravastatin, repatha [evolocumab], rosuvastatin, and  oxycodone.  MEDICATIONS:  Current Outpatient Medications  Medication Sig Dispense Refill  . acetaminophen (TYLENOL) 500 MG tablet Take 1,000 mg by mouth every 6 (six) hours as needed.    Marland Kitchen albuterol (VENTOLIN HFA) 108 (90 Base) MCG/ACT inhaler Inhale 1-2 puffs into the lungs every 6 (six) hours as needed for wheezing or shortness of breath. 18 g 2  . alendronate (FOSAMAX) 70 MG tablet Take 1 tablet (70 mg total) by mouth once a week. full glass of water on an empty stomach. 12 tablet 2  . anastrozole (ARIMIDEX) 1 MG tablet TAKE (1) TABLET BY MOUTH ONCE DAILY. 90 tablet 0  . BREO ELLIPTA 200-25 MCG/INH AEPB Inhale 1 puff into the lungs daily as needed. 60 each 2  . budesonide-formoterol (SYMBICORT) 160-4.5 MCG/ACT inhaler Inhale 2 puffs into the lungs daily as needed. 1 Inhaler 2  . chlorthalidone (HYGROTON) 25 MG tablet Take 1 tablet (25 mg total) by mouth daily. 30 tablet 0  . clonazePAM (KLONOPIN) 0.5 MG tablet Take 1 tablet (0.5 mg total) by mouth 3 (three) times daily as needed (anxiety or sleep). 90 tablet 0  . HYDROCHLOROTHIAZIDE PO Take by mouth.    . hydrOXYzine (VISTARIL) 50 MG capsule Take 2 capsules (100 mg total) by mouth at bedtime. 90 capsule 1  . montelukast (SINGULAIR) 10 MG tablet Take 10 mg by mouth daily.    . pantoprazole (PROTONIX) 40 MG tablet Take 1 tablet (40 mg total) by mouth daily. 90 tablet 1  . potassium chloride SA (KLOR-CON) 20 MEQ tablet Take 1 tablet (20 mEq total) by mouth daily. 90 tablet 3  . Probiotic Product (SOLUBLE FIBER/PROBIOTICS PO) Take by mouth.    . QUEtiapine (SEROQUEL) 100 MG tablet Take 100 mg  by mouth at bedtime. 150-$RemoveBeforeD'200mg'[id number]$     . sertraline (ZOLOFT) 100 MG tablet Take 150 mg by mouth daily.    Marland Kitchen tiotropium (SPIRIVA HANDIHALER) 18 MCG inhalation capsule Place 1 capsule (18 mcg total) into inhaler and inhale daily. 90 capsule 1  . traMADol (ULTRAM) 50 MG tablet Take 1 tablet (50 mg total) by mouth 2 (two) times daily. 60 tablet 1   No current  facility-administered medications for this visit.    PHYSICAL EXAMINATION: ECOG PERFORMANCE STATUS: 1 - Symptomatic but completely ambulatory  Vitals:   01/31/20 1424  BP: (!) 123/95  Pulse: (!) 104  Resp: 18  Temp: 98.3 F (36.8 C)  SpO2: 95%   Filed Weights   01/31/20 1424  Weight: 163 lb 12.8 oz (74.3 kg)    BREAST: No palpable masses or nodules in either right or left breasts. No palpable axillary supraclavicular or infraclavicular adenopathy no breast tenderness or nipple discharge. (exam performed in the presence of a chaperone)  LABORATORY DATA:  I have reviewed the data as listed CMP Latest Ref Rng & Units 03/29/2019 12/08/2018 02/14/2016  Glucose 65 - 99 mg/dL - 104(H) 83  BUN 8 - 27 mg/dL - 9 8  Creatinine 0.57 - 1.00 mg/dL - 1.07(H) 0.60  Sodium 134 - 144 mmol/L - 133(L) 139  Potassium 3.5 - 5.2 mmol/L - 3.3(L) 3.4(L)  Chloride 96 - 106 mmol/L - 89(L) 99(L)  CO2 20 - 29 mmol/L - 27 -  Calcium 8.7 - 10.3 mg/dL - 9.6 -  Total Protein 6.0 - 8.5 g/dL 6.7 6.5 -  Total Bilirubin 0.0 - 1.2 mg/dL 0.4 0.3 -  Alkaline Phos 39 - 117 IU/L 143(H) 123(H) -  AST 0 - 40 IU/L 27 22 -  ALT 0 - 32 IU/L 29 19 -    Lab Results  Component Value Date   WBC 9.2 01/10/2016   HGB 15.0 02/14/2016   HCT 44.0 02/14/2016   MCV 90.0 01/10/2016   PLT 293 01/10/2016   NEUTROABS 6.2 01/10/2016    ASSESSMENT & PLAN:  Breast cancer of upper-inner quadrant of right female breast ([place]) Right lumpectomy 02/14/2016: Invasive lobular cancer, grade 1, 1.8 cm, margins negative but close, 0/1 lymph node negative, T1 CN 0 stage IA, ER 95%, PR 95%, Ki-67 10%, HER-2 negative ratio 1.31 We did not order Oncotype DX testing because the patient is not interested in chemotherapy.  Treatment summary: 1. Adjuvant radiation therapy (at [facility]) completed 05/08/2016 2. Adjuvant antiestrogen therapy with anastrozole 1 mg daily since 05/20/2016  Anastrozole toxicities: Denies any myalgias. Does have  occasional hot flashes.  Breast cancer surveillance: 1.Breast exam done9/13/2021: Benign 2. MammogramsSeptember 2019 at [place]. Biopsy: benign  Left ankle fracture with severe osteopenia: Her last bone density test at [facility] in [facility] on 12/21/2015 revealed a T score of -2.4: On Fosamax with calcium and vitamin D.  I renewed her prescription today.  She takes 7 different supplements  Return to clinic in 1 year for follow-up     No orders of the defined types were placed in this encounter.  The patient has a good understanding of the overall plan. she agrees with it. she will call with any problems that may develop before the next visit here.  Total time spent: 20 mins including face to face time and time spent for planning, charting and coordination of care  Nicholas Lose, MD 01/31/2020  I, Cloyde Reams Dorshimer, am acting as scribe for Dr. Nicholas Lose.  I have  reviewed the above documentation for accuracy and completeness, and I agree with the above.

## 2020-01-31 ENCOUNTER — Inpatient Hospital Stay: Payer: Medicare HMO | Attending: Hematology and Oncology | Admitting: Hematology and Oncology

## 2020-01-31 ENCOUNTER — Other Ambulatory Visit: Payer: Self-pay

## 2020-01-31 ENCOUNTER — Telehealth: Payer: Self-pay | Admitting: Hematology and Oncology

## 2020-01-31 ENCOUNTER — Other Ambulatory Visit: Payer: Self-pay | Admitting: *Deleted

## 2020-01-31 ENCOUNTER — Encounter: Payer: Self-pay | Admitting: Internal Medicine

## 2020-01-31 DIAGNOSIS — C50211 Malignant neoplasm of upper-inner quadrant of right female breast: Secondary | ICD-10-CM | POA: Diagnosis not present

## 2020-01-31 DIAGNOSIS — Z79810 Long term (current) use of selective estrogen receptor modulators (SERMs): Secondary | ICD-10-CM | POA: Diagnosis not present

## 2020-01-31 DIAGNOSIS — M8580 Other specified disorders of bone density and structure, unspecified site: Secondary | ICD-10-CM | POA: Insufficient documentation

## 2020-01-31 DIAGNOSIS — Z923 Personal history of irradiation: Secondary | ICD-10-CM | POA: Diagnosis not present

## 2020-01-31 DIAGNOSIS — Z170 Estrogen receptor positive status [ER+]: Secondary | ICD-10-CM | POA: Insufficient documentation

## 2020-01-31 DIAGNOSIS — Z7983 Long term (current) use of bisphosphonates: Secondary | ICD-10-CM | POA: Diagnosis not present

## 2020-01-31 MED ORDER — ANASTROZOLE 1 MG PO TABS: ORAL_TABLET | ORAL | 3 refills | Status: DC

## 2020-01-31 MED ORDER — MONTELUKAST SODIUM 10 MG PO TABS: 10.0000 mg | ORAL_TABLET | Freq: Every day | ORAL | 3 refills | Status: DC

## 2020-01-31 MED ORDER — PANTOPRAZOLE SODIUM 40 MG PO TBEC
40.0000 mg | DELAYED_RELEASE_TABLET | Freq: Every day | ORAL | 3 refills | Status: DC
Start: 2020-01-31 — End: 2020-02-01

## 2020-01-31 MED ORDER — PANTOPRAZOLE SODIUM 40 MG PO TBEC: 40.0000 mg | DELAYED_RELEASE_TABLET | Freq: Every day | ORAL | 3 refills | Status: DC

## 2020-01-31 MED ORDER — ALENDRONATE SODIUM 70 MG PO TABS: 70.0000 mg | ORAL_TABLET | ORAL | 3 refills | Status: DC

## 2020-01-31 NOTE — Telephone Encounter (Signed)
Scheduled appts per 9/13 los. Gave pt a print out of AVS.

## 2020-01-31 NOTE — Assessment & Plan Note (Signed)
Right lumpectomy 02/14/2016: Invasive lobular cancer, grade 1, 1.8 cm, margins negative but close, 0/1 lymph node negative, T1 CN 0 stage IA, ER 95%, PR 95%, Ki-67 10%, HER-2 negative ratio 1.31 We did not order Oncotype DX testing because the patient is not interested in chemotherapy.  Treatment summary: 1. Adjuvant radiation therapy (at [facility]) completed 05/08/2016 2. Adjuvant antiestrogen therapy with anastrozole 1 mg daily since 05/20/2016  Anastrozole toxicities: Denies any myalgias. Does have occasional hot flashes.  Breast cancer surveillance: 1.Breast exam done9/13/2021: Benign 2. MammogramsSeptember 2019 at [place]. Biopsy: benign  Left ankle fracture with severe osteopenia: Her last bone density test at [facility] in [facility] on 12/21/2015 revealed a T score of -2.4: On Fosamax with calcium and vitamin D. She will restart it soon.  Severe GI problems with C.Diff and Gastritis: Seeing GI She takes 7 different supplements  Return to clinic in 1 year for follow-up

## 2020-02-01 MED ORDER — MONTELUKAST SODIUM 10 MG PO TABS
10.0000 mg | ORAL_TABLET | Freq: Every day | ORAL | 1 refills | Status: DC
Start: 2020-02-01 — End: 2020-02-08

## 2020-02-01 MED ORDER — PANTOPRAZOLE SODIUM 40 MG PO TBEC
40.0000 mg | DELAYED_RELEASE_TABLET | Freq: Every day | ORAL | 1 refills | Status: DC
Start: 2020-02-01 — End: 2020-02-08

## 2020-02-01 MED ORDER — HYDROCHLOROTHIAZIDE 50 MG PO TABS: 50.0000 mg | ORAL_TABLET | Freq: Every day | ORAL | 1 refills | Status: DC

## 2020-02-02 ENCOUNTER — Ambulatory Visit (HOSPITAL_COMMUNITY)
Admission: RE | Admit: 2020-02-02 | Discharge: 2020-02-02 | Disposition: A | Discharge status: Home / Self Care | Payer: Medicare HMO | Source: Ambulatory Visit | Attending: Cardiology | Admitting: Cardiology

## 2020-02-02 ENCOUNTER — Other Ambulatory Visit: Payer: Self-pay

## 2020-02-02 DIAGNOSIS — I771 Stricture of artery: Secondary | ICD-10-CM | POA: Insufficient documentation

## 2020-02-02 DIAGNOSIS — I6523 Occlusion and stenosis of bilateral carotid arteries: Secondary | ICD-10-CM | POA: Diagnosis not present

## 2020-02-07 ENCOUNTER — Other Ambulatory Visit: Payer: Self-pay

## 2020-02-08 ENCOUNTER — Encounter: Payer: Self-pay | Admitting: Internal Medicine

## 2020-02-08 ENCOUNTER — Ambulatory Visit (INDEPENDENT_AMBULATORY_CARE_PROVIDER_SITE_OTHER): Payer: Medicare HMO | Admitting: Internal Medicine

## 2020-02-08 VITALS — BP 120/85 | HR 106 | Temp 98.2°F | Ht 65.5 in | Wt 162.5 lb

## 2020-02-08 DIAGNOSIS — F332 Major depressive disorder, recurrent severe without psychotic features: Secondary | ICD-10-CM

## 2020-02-08 DIAGNOSIS — F1021 Alcohol dependence, in remission: Secondary | ICD-10-CM

## 2020-02-08 DIAGNOSIS — E782 Mixed hyperlipidemia: Secondary | ICD-10-CM | POA: Diagnosis not present

## 2020-02-08 DIAGNOSIS — F17210 Nicotine dependence, cigarettes, uncomplicated: Secondary | ICD-10-CM

## 2020-02-08 DIAGNOSIS — T466X5A Adverse effect of antihyperlipidemic and antiarteriosclerotic drugs, initial encounter: Secondary | ICD-10-CM

## 2020-02-08 DIAGNOSIS — F603 Borderline personality disorder: Secondary | ICD-10-CM

## 2020-02-08 DIAGNOSIS — I10 Essential (primary) hypertension: Secondary | ICD-10-CM | POA: Diagnosis not present

## 2020-02-08 DIAGNOSIS — J449 Chronic obstructive pulmonary disease, unspecified: Secondary | ICD-10-CM | POA: Diagnosis not present

## 2020-02-08 DIAGNOSIS — G720 Drug-induced myopathy: Secondary | ICD-10-CM | POA: Diagnosis not present

## 2020-02-08 DIAGNOSIS — C50211 Malignant neoplasm of upper-inner quadrant of right female breast: Secondary | ICD-10-CM | POA: Diagnosis not present

## 2020-02-08 DIAGNOSIS — Z170 Estrogen receptor positive status [ER+]: Secondary | ICD-10-CM

## 2020-02-08 DIAGNOSIS — F17200 Nicotine dependence, unspecified, uncomplicated: Secondary | ICD-10-CM

## 2020-02-08 DIAGNOSIS — Z0000 Encounter for general adult medical examination without abnormal findings: Secondary | ICD-10-CM

## 2020-02-08 DIAGNOSIS — Z1211 Encounter for screening for malignant neoplasm of colon: Secondary | ICD-10-CM

## 2020-02-08 MED ORDER — SPIRIVA HANDIHALER 18 MCG IN CAPS: 18.0000 ug | ORAL_CAPSULE | Freq: Every day | RESPIRATORY_TRACT | 2 refills | Status: DC

## 2020-02-08 MED ORDER — HYDROCHLOROTHIAZIDE 50 MG PO TABS
50.0000 mg | ORAL_TABLET | Freq: Every day | ORAL | 1 refills | Status: DC
Start: 2020-02-08 — End: 2021-04-05

## 2020-02-08 MED ORDER — BUDESONIDE-FORMOTEROL FUMARATE 160-4.5 MCG/ACT IN AERO: 2.0000 | INHALATION_SPRAY | Freq: Every day | RESPIRATORY_TRACT | 2 refills | Status: DC | PRN

## 2020-02-08 MED ORDER — POTASSIUM CHLORIDE CRYS ER 20 MEQ PO TBCR
20.0000 meq | EXTENDED_RELEASE_TABLET | Freq: Every day | ORAL | 1 refills | Status: DC
Start: 2020-02-08 — End: 2021-05-24

## 2020-02-08 MED ORDER — MONTELUKAST SODIUM 10 MG PO TABS: 10.0000 mg | ORAL_TABLET | Freq: Every day | ORAL | 1 refills | Status: AC

## 2020-02-08 MED ORDER — CHLORTHALIDONE 25 MG PO TABS
25.0000 mg | ORAL_TABLET | Freq: Every day | ORAL | 1 refills | Status: DC
Start: 2020-02-08 — End: 2023-08-08

## 2020-02-08 MED ORDER — ALENDRONATE SODIUM 70 MG PO TABS
70.0000 mg | ORAL_TABLET | ORAL | 3 refills | Status: DC
Start: 2020-02-08 — End: 2023-08-08

## 2020-02-08 MED ORDER — PANTOPRAZOLE SODIUM 40 MG PO TBEC
40.0000 mg | DELAYED_RELEASE_TABLET | Freq: Every day | ORAL | 1 refills | Status: AC
Start: 2020-02-08 — End: ?

## 2020-02-08 MED ORDER — ALBUTEROL SULFATE HFA 108 (90 BASE) MCG/ACT IN AERS: 1.0000 | INHALATION_SPRAY | Freq: Four times a day (QID) | RESPIRATORY_TRACT | 2 refills | Status: DC | PRN

## 2020-02-08 NOTE — Progress Notes (Signed)
Established Patient Office Visit     This visit occurred during the SARS-CoV-2 public health emergency.  Safety protocols were in place, including screening questions prior to the visit, additional usage of staff PPE, and extensive cleaning of exam room while observing appropriate contact time as indicated for disinfecting solutions.    CC/Reason for Visit: Annual preventive exam and subsequent Medicare wellness visit  HPI: Elizabeth Cherry is a 64 y.o. female who is coming in today for the above mentioned reasons. Past Medical History is significant for: Right breast cancer in 2016 status post lumpectomy and radiation now on Arimidex followed by Dr. Lindi Adie.  Has a history of tobacco use disorder, has been smoking a pack plus a day since age 43, she was a prior alcoholic who quit in 9628.  She has a history of hypertension and right carotid artery stenosis followed by Dr. Acie Fredrickson, history of COPD not followed by pulmonologist on Symbicort, Spiriva and as needed albuterol.  She has a history of GERD/gastritis on Protonix, history of hyperlipidemia with severe statin intolerance, and most significantly she has a history of depression and anxiety closely followed by psychiatry.  She has routine eye and dental care.  She had a colonoscopy in January 2019 but is a 2-year follow-up and is overdue, she is overdue for mammogram but has a scheduled for October.  She quit taking her Breo due to recurrent fungal infections of her mouth despite adequate rinsing.  She had her flu shot this year already.   Past Medical/Surgical History: Past Medical History:  Diagnosis Date  . Alcoholism ([place])    IN REMISSION ALMOST 20 YEARS  . Allergic rhinitis   . Anorectal hemorrhage   . Anxiety   . Asthma   . Benign neoplasm of colon 10/24/2015  . Borderline personality disorder ([place])   . Breast cancer of upper-inner quadrant of right female breast ([place]) 01/08/2016  . Carotid stenosis, bilateral   . Chronic  gastritis without bleeding    OTHER, WITHOUT HEMORRHAGE  . Chronic obstructive pulmonary disease ([place]) 11/15/2011  . Chronic pain 10/24/2011   Overview:  Managed by [facility]. Gets Fentanyl Patches - sees them every other month.   Marland Kitchen COPD (chronic obstructive pulmonary disease) ([place])   . Depression   . Dyslipidemia   . Edema    due to psychiatric meds per patient, on a diuretic  . Elevated cholesterol   . Fibromyalgia   . Generalized anxiety disorder   . GERD (gastroesophageal reflux disease)   . Hypertension    no meds now  . Irritable bowel syndrome    UNSPECIFIED TYPE  . Malignant neoplasm of unspecified site of unspecified female breast ([place])   . Mixed hyperlipidemia   . Multiple gastric ulcers   . Personal history of colonic polyps   . Psoriasis 02/11/2012  . Sciatica   . Self neglect 10/23/2015  . Severe recurrent major depression without psychotic features ([place]) 10/23/2015  . Tobacco dependence     Past Surgical History:  Procedure Laterality Date  . BREAST LUMPECTOMY WITH RADIOACTIVE SEED AND SENTINEL LYMPH NODE BIOPSY Right 02/14/2016   Procedure: BREAST LUMPECTOMY WITH RADIOACTIVE SEED AND SENTINEL LYMPH NODE BIOPSY;  Surgeon: Excell Seltzer, MD;  Location: [facility];  Service: General;  Laterality: Right;  . DIAGNOSTIC LAPAROSCOPY    . DILATION AND CURETTAGE OF UTERUS    . GYNECOLOGIC CRYOSURGERY      Social History:  reports that she  has been smoking cigarettes. She has been smoking about 1.50 packs per day. She has never used smokeless tobacco. She reports that she does not drink alcohol and does not use drugs.  Allergies: Allergies  Allergen Reactions  . Bactrim [Sulfamethoxazole-Trimethoprim] Hives  . Diazepam Other (See Comments)    Severe depression  . Fluoxetine Swelling and Anaphylaxis    Pt reports she is allergic to filler in prozac, her throat swell.  Pt reports she is allergic to filler in prozac, her  throat swell.   Pt reports she is allergic to filler in prozac, her throat swell.  Pt reports she is allergic to filler in prozac, her throat swell.  Pt reports she is allergic to filler in prozac, her throat swell.  Pt reports she is allergic to filler in prozac, her throat swell.    . Telmisartan Hives and Other (See Comments)    Other reaction(s): Other (See Comments) Other Reaction: OTHER REACTION Other Reaction: OTHER REACTION Other reaction(s): Other (See Comments) Other Reaction: OTHER REACTION  Other Reaction: OTHER REACTION Other reaction(s): Other (See Comments) Other Reaction: OTHER REACTION Other Reaction: OTHER REACTION Other reaction(s): Other (See Comments) Other Reaction: OTHER REACTION Other Reaction: OTHER REACTION Other reaction(s): Other (See Comments) Other Reaction: OTHER REACTION Other Reaction: OTHER REACTION Other reaction(s): Other (See Comments) Other Reaction: OTHER REACTION   . Tetracycline Hives and Rash  . Tetracyclines & Related Hives and Rash  . Aspirin Other (See Comments)    Gastric ulcers  Other Reaction: stomach ulcer Gastric ulcers Other Reaction: stomach ulcer Gastric ulcers   . Cefdinir Other (See Comments)    Other reaction(s): Other (See Comments) Other Reaction: EXTREME EXHAUSTION Other Reaction: EXTREME EXHAUSTION Other reaction(s): Other (See Comments) Other Reaction: EXTREME EXHAUSTION  Other Reaction: EXTREME EXHAUSTION Other reaction(s): Other (See Comments) Other Reaction: EXTREME EXHAUSTION Other Reaction: EXTREME EXHAUSTION Other reaction(s): Other (See Comments) Other Reaction: EXTREME EXHAUSTION Other Reaction: EXTREME EXHAUSTION Other reaction(s): Other (See Comments) Other Reaction: EXTREME EXHAUSTION Other Reaction: EXTREME EXHAUSTION Other reaction(s): Other (See Comments) Other Reaction: EXTREME EXHAUSTION   . Clarithromycin Other (See Comments)    unknown  . Nsaids Other (See Comments)    Gastric  ulcers  Other Reaction: stomach ulcer Other Reaction: stomach ulcer  Gastric ulcers  . Risperidone Nausea And Vomiting  . Trazodone Other (See Comments)    Dry eyes Dry eyes Dry eyes  Dry eyes Dry eyes Dry eyes Dry eyes Dry eyes Dry eyes Dry eyes Dry eyes   . Atorvastatin     20mg  caused myalgias  . Praluent [Alirocumab]     Joint pain  . Pravastatin     40mg  caused myalgias  . Repatha [Evolocumab]     Myalgias and flu like symptoms  . Rosuvastatin     myalgias  . Oxycodone Nausea Only    Family History:  Family History  Problem Relation Age of Onset  . Colon cancer Maternal Grandmother      Current Outpatient Medications:  .  acetaminophen (TYLENOL) 500 MG tablet, Take 1,000 mg by mouth every 6 (six) hours as needed., Disp: , Rfl:  .  albuterol (VENTOLIN HFA) 108 (90 Base) MCG/ACT inhaler, Inhale 1-2 puffs into the lungs every 6 (six) hours as needed for wheezing or shortness of breath., Disp: 18 g, Rfl: 2 .  alendronate (FOSAMAX) 70 MG tablet, Take 1 tablet (70 mg total) by mouth once a week. full glass of water on an empty stomach., Disp: 12 tablet, Rfl: 3 .  anastrozole (ARIMIDEX) 1 MG tablet, TAKE (1) TABLET BY MOUTH ONCE DAILY., Disp: 90 tablet, Rfl: 3 .  budesonide-formoterol (SYMBICORT) 160-4.5 MCG/ACT inhaler, Inhale 2 puffs into the lungs daily as needed., Disp: 1 Inhaler, Rfl: 2 .  chlorthalidone (HYGROTON) 25 MG tablet, Take 1 tablet (25 mg total) by mouth daily., Disp: 30 tablet, Rfl: 0 .  clonazePAM (KLONOPIN) 0.5 MG tablet, Take 1 tablet (0.5 mg total) by mouth 3 (three) times daily as needed (anxiety or sleep)., Disp: 90 tablet, Rfl: 0 .  hydrochlorothiazide (HYDRODIURIL) 50 MG tablet, Take 1 tablet (50 mg total) by mouth daily., Disp: 90 tablet, Rfl: 1 .  hydrOXYzine (VISTARIL) 50 MG capsule, Take 2 capsules (100 mg total) by mouth at bedtime., Disp: 90 capsule, Rfl: 1 .  montelukast (SINGULAIR) 10 MG tablet, Take 1 tablet (10 mg total) by mouth  daily., Disp: 90 tablet, Rfl: 1 .  pantoprazole (PROTONIX) 40 MG tablet, Take 1 tablet (40 mg total) by mouth daily., Disp: 90 tablet, Rfl: 1 .  potassium chloride SA (KLOR-CON) 20 MEQ tablet, Take 1 tablet (20 mEq total) by mouth daily., Disp: 90 tablet, Rfl: 3 .  Probiotic Product (SOLUBLE FIBER/PROBIOTICS PO), Take by mouth., Disp: , Rfl:  .  QUEtiapine (SEROQUEL) 100 MG tablet, Take 100 mg by mouth at bedtime. 150-200mg , Disp: , Rfl:  .  sertraline (ZOLOFT) 100 MG tablet, Take 150 mg by mouth daily., Disp: , Rfl:  .  tiotropium (SPIRIVA HANDIHALER) 18 MCG inhalation capsule, Place 1 capsule (18 mcg total) into inhaler and inhale daily., Disp: 90 capsule, Rfl: 1 .  traMADol (ULTRAM) 50 MG tablet, Take 1 tablet (50 mg total) by mouth 2 (two) times daily., Disp: 60 tablet, Rfl: 1 .  BREO ELLIPTA 200-25 MCG/INH AEPB, Inhale 1 puff into the lungs daily as needed. (Patient not taking: Reported on 02/08/2020), Disp: 60 each, Rfl: 2  Review of Systems:  Constitutional: Denies fever, chills, diaphoresis, appetite change and fatigue.  HEENT: Denies photophobia, eye pain, redness, hearing loss, ear pain, congestion, sore throat, rhinorrhea, sneezing, mouth sores, trouble swallowing, neck pain, neck stiffness and tinnitus.   Respiratory: Denies SOB, DOE, cough, chest tightness,  and wheezing.   Cardiovascular: Denies chest pain, palpitations and leg swelling.  Gastrointestinal: Denies nausea, vomiting, abdominal pain, diarrhea, constipation, blood in stool and abdominal distention.  Genitourinary: Denies dysuria, urgency, frequency, hematuria, flank pain and difficulty urinating.  Endocrine: Denies: hot or cold intolerance, sweats, changes in hair or nails, polyuria, polydipsia. Musculoskeletal: Denies myalgias, back pain, joint swelling, arthralgias and gait problem.  Skin: Denies pallor, rash and wound.  Neurological: Denies dizziness, seizures, syncope, weakness, light-headedness, numbness and  headaches.  Hematological: Denies adenopathy. Easy bruising, personal or family bleeding history  Psychiatric/Behavioral: Denies suicidal ideation, mood changes, confusion, nervousness, sleep disturbance and agitation    Physical Exam: Vitals:   02/08/20 0958  BP: 120/85  Pulse: (!) 106  Temp: 98.2 F (36.8 C)  TempSrc: Oral  SpO2: 95%  Weight: 162 lb 8 oz (73.7 kg)  Height: 5' 5.5" (1.664 m)    Body mass index is 26.63 kg/m.   Constitutional: NAD, calm, comfortable Eyes: PERRL, lids and conjunctivae normal ENMT: Mucous membranes are moist.Tympanic membrane is pearly white, no erythema or bulging. Neck: normal, supple, no masses, no thyromegaly Respiratory: clear to auscultation bilaterally, no wheezing, no crackles. Normal respiratory effort. No accessory muscle use.  Cardiovascular: Regular rate and rhythm, no murmurs / rubs / gallops. No extremity edema. 2+ pedal pulses.  Abdomen: no tenderness, no masses palpated. No hepatosplenomegaly. Bowel sounds positive.  Musculoskeletal: no clubbing / cyanosis. No joint deformity upper and lower extremities. Good ROM, no contractures. Normal muscle tone.  Skin: no rashes, lesions, ulcers. No induration Neurologic: CN 2-12 grossly intact. Sensation intact, DTR normal. Strength 5/5 in all 4.  Psychiatric: Normal judgment and insight. Alert and oriented x 3. Normal mood.   Subsequent Medicare wellness visit   1. Risk factors, based on past  M,S,F -cardiovascular disease include age, history of tobacco abuse, hypertension, hyperlipidemia that is untreated   2.  Physical activities: She is quite sedentary   3.  Depression/mood:  She has a history of depression and her mood is a little depressed today.   4.  Hearing:  No perceived issues   5.  ADL's: Independent in all ADLs   6.  Fall risk:  Low fall risk   7.  Home safety: No problems identified   8.  Height weight, and visual acuity: Height and weight as above, visual acuity  is 20/30 with each eye independently and combined   9.  Counseling:  We have continue to discuss smoking cessation   10. Lab orders based on risk factors: Laboratory update will be reviewed   11. Referral :  None today   12. Care plan:  Follow-up with me in 6 months   13. Cognitive assessment:  No cognitive impairment   14. Screening: Patient provided with a written and personalized 5-10 year screening schedule in the AVS.   yes   15. Provider List Update:   PCP, cardiology Dr. Acie Fredrickson, oncology Dr. Lindi Adie  16. Advance Directives: Full code     Office Visit from 02/08/2020 in [facility]  PHQ-9 Total Score 3      Fall Risk  02/08/2020  Falls in the past year? 0     Impression and Plan:  Encounter for preventive health examination  -She has routine eye and dental care. -All immunizations are up-to-date. -She will return for screening labs as she is not fasting today. -Healthy lifestyle discussed in detail. -She is overdue for colonoscopy, she is not happy with her current GI practice, I will send in referral to another GI practice. -She has a mammogram scheduled for October.  Statin myopathy Mixed hyperlipidemia  - Plan: Lipid panel  Malignant neoplasm of upper-inner quadrant of right breast in female, estrogen receptor positive ([place]) -Followed by oncology  Tobacco use disorder -I have discussed tobacco cessation with the patient.  I have counseled the patient regarding the negative impacts of continued tobacco use including but not limited to lung cancer, COPD, and cardiovascular disease.  I have discussed alternatives to tobacco and modalities that may help facilitate tobacco cessation including but not limited to biofeedback, hypnosis, and medications.  Total time spent with tobacco counseling was 4 minutes. -She is not interested in quitting, I will continue to address at subsequent visits.  Alcohol use disorder, severe, in sustained remission  ([place]) -Noted  Essential hypertension  -Well-controlled today.  Chronic obstructive pulmonary disease, unspecified COPD type ([place]) -She is on Spiriva, Symbicort and as needed albuterol. -She has discontinued use of Breo due to ongoing thrush infections of the mouth.  Borderline personality disorder ([place]) Severe recurrent major depression without psychotic features ([place]) -Followed closely by her psychiatrist and therapist.  Screening for malignant neoplasm of colon  - Plan: Ambulatory referral to Gastroenterology     Lelon Frohlich, MD [facility]

## 2020-02-08 NOTE — Addendum Note (Signed)
Addended by: Westley Hummer B on: 02/08/2020 11:22 AM   Modules accepted: Orders

## 2020-02-10 ENCOUNTER — Ambulatory Visit: Payer: Medicare HMO | Admitting: Cardiovascular Disease

## 2020-02-11 ENCOUNTER — Encounter: Payer: Self-pay | Admitting: Internal Medicine

## 2020-02-16 ENCOUNTER — Encounter: Payer: Self-pay | Admitting: Internal Medicine

## 2020-02-16 MED ORDER — TRAMADOL HCL 50 MG PO TABS
50.0000 mg | ORAL_TABLET | Freq: Two times a day (BID) | ORAL | 1 refills | Status: DC
Start: 2020-02-16 — End: 2020-04-18

## 2020-02-21 DIAGNOSIS — F4310 Post-traumatic stress disorder, unspecified: Secondary | ICD-10-CM | POA: Diagnosis not present

## 2020-02-21 DIAGNOSIS — F339 Major depressive disorder, recurrent, unspecified: Secondary | ICD-10-CM | POA: Diagnosis not present

## 2020-02-21 DIAGNOSIS — G4700 Insomnia, unspecified: Secondary | ICD-10-CM | POA: Diagnosis not present

## 2020-02-21 DIAGNOSIS — F1021 Alcohol dependence, in remission: Secondary | ICD-10-CM | POA: Diagnosis not present

## 2020-02-23 ENCOUNTER — Other Ambulatory Visit: Payer: Self-pay

## 2020-02-24 ENCOUNTER — Other Ambulatory Visit: Payer: Medicare HMO

## 2020-02-24 DIAGNOSIS — E782 Mixed hyperlipidemia: Secondary | ICD-10-CM | POA: Diagnosis not present

## 2020-02-24 DIAGNOSIS — E785 Hyperlipidemia, unspecified: Secondary | ICD-10-CM

## 2020-02-24 DIAGNOSIS — I10 Essential (primary) hypertension: Secondary | ICD-10-CM | POA: Diagnosis not present

## 2020-02-24 DIAGNOSIS — Z0000 Encounter for general adult medical examination without abnormal findings: Secondary | ICD-10-CM | POA: Diagnosis not present

## 2020-02-24 NOTE — Addendum Note (Signed)
Addended by: Marrion Coy on: 02/24/2020 10:23 AM   Modules accepted: Orders

## 2020-02-25 LAB — LIPID PANEL
Cholesterol: 304 mg/dL — ABNORMAL HIGH (ref <200)
HDL: 60 mg/dL (ref >=50)
LDL Cholesterol (Calc): 188 mg/dL (calc) — ABNORMAL HIGH
Non-HDL Cholesterol (Calc): 244 mg/dL (calc) — ABNORMAL HIGH (ref <130)
Total CHOL/HDL Ratio: 5.1 (calc) — ABNORMAL HIGH (ref <5.0)
Triglycerides: 329 mg/dL — ABNORMAL HIGH (ref <150)

## 2020-02-25 LAB — COMPREHENSIVE METABOLIC PANEL
AG Ratio: 1.8 (calc) (ref 1.0–2.5)
ALT: 16 U/L (ref 6–29)
AST: 16 U/L (ref 10–35)
Albumin: 4.4 g/dL (ref 3.6–5.1)
Alkaline phosphatase (APISO): 99 U/L (ref 37–153)
BUN: 12 mg/dL (ref 7–25)
CO2: 29 mmol/L (ref 20–32)
Calcium: 9.6 mg/dL (ref 8.6–10.4)
Chloride: 96 mmol/L — ABNORMAL LOW (ref 98–110)
Creat: 0.88 mg/dL (ref 0.50–0.99)
Globulin: 2.4 g/dL (calc) (ref 1.9–3.7)
Glucose, Bld: 89 mg/dL (ref 65–99)
Potassium: 3.8 mmol/L (ref 3.5–5.3)
Sodium: 136 mmol/L (ref 135–146)
Total Bilirubin: 0.4 mg/dL (ref 0.2–1.2)
Total Protein: 6.8 g/dL (ref 6.1–8.1)

## 2020-02-25 LAB — CBC WITH DIFFERENTIAL/PLATELET
Absolute Monocytes: 649 cells/uL (ref 200–950)
Basophils Absolute: 94 cells/uL (ref 0–200)
Basophils Relative: 1 %
Eosinophils Absolute: 197 cells/uL (ref 15–500)
Eosinophils Relative: 2.1 %
HCT: 43.2 % (ref 35.0–45.0)
Hemoglobin: 14.1 g/dL (ref 11.7–15.5)
Lymphs Abs: 1485 cells/uL (ref 850–3900)
MCH: 27.1 pg (ref 27.0–33.0)
MCHC: 32.6 g/dL (ref 32.0–36.0)
MCV: 82.9 fL (ref 80.0–100.0)
MPV: 8.8 fL (ref 7.5–12.5)
Monocytes Relative: 6.9 %
Neutro Abs: 6975 cells/uL (ref 1500–7800)
Neutrophils Relative %: 74.2 %
Platelets: 407 10*3/uL — ABNORMAL HIGH (ref 140–400)
RBC: 5.21 10*6/uL — ABNORMAL HIGH (ref 3.80–5.10)
RDW: 14.9 % (ref 11.0–15.0)
Total Lymphocyte: 15.8 %
WBC: 9.4 10*3/uL (ref 3.8–10.8)

## 2020-02-25 LAB — TSH: TSH: 1.25 mIU/L (ref 0.40–4.50)

## 2020-02-25 LAB — VITAMIN D 25 HYDROXY (VIT D DEFICIENCY, FRACTURES): Vit D, 25-Hydroxy: 17 ng/mL — ABNORMAL LOW (ref 30–100)

## 2020-02-25 LAB — VITAMIN B12: Vitamin B-12: 375 pg/mL (ref 200–1100)

## 2020-02-25 LAB — HEMOGLOBIN A1C
Hgb A1c MFr Bld: 5.6 % of total Hgb (ref <5.7)
Mean Plasma Glucose: 114 (calc)
eAG (mmol/L): 6.3 (calc)

## 2020-02-28 ENCOUNTER — Telehealth: Payer: Self-pay | Admitting: Cardiovascular Disease

## 2020-02-28 NOTE — Telephone Encounter (Signed)
Pt had further questions reviewed results once again an pt verbalized understanding ./cy

## 2020-02-28 NOTE — Telephone Encounter (Signed)
Patient calling back about her carotid results. She states she did not right everything down.

## 2020-02-28 NOTE — Telephone Encounter (Signed)
She has moderate R carotid stenosis and mild left carotid stenosis. She has a BP drop from the right arm to the left arm suggestive of a subclavian stenosis. She needs to stop smoking   Will continue to follow  She should call if she develops any signs of arm ischemia ( pain, discoloration, weakness)

## 2020-02-29 DIAGNOSIS — Z853 Personal history of malignant neoplasm of breast: Secondary | ICD-10-CM | POA: Diagnosis not present

## 2020-02-29 LAB — HM MAMMOGRAPHY

## 2020-03-02 ENCOUNTER — Ambulatory Visit: Payer: Medicare HMO | Admitting: Internal Medicine

## 2020-03-02 ENCOUNTER — Other Ambulatory Visit: Payer: Self-pay | Admitting: Internal Medicine

## 2020-03-02 ENCOUNTER — Encounter: Payer: Self-pay | Admitting: Internal Medicine

## 2020-03-02 DIAGNOSIS — E559 Vitamin D deficiency, unspecified: Secondary | ICD-10-CM | POA: Insufficient documentation

## 2020-03-02 DIAGNOSIS — E782 Mixed hyperlipidemia: Secondary | ICD-10-CM

## 2020-03-02 MED ORDER — VITAMIN D (ERGOCALCIFEROL) 1.25 MG (50000 UNIT) PO CAPS: 50000.0000 [IU] | ORAL_CAPSULE | ORAL | 0 refills | Status: AC

## 2020-03-06 ENCOUNTER — Other Ambulatory Visit: Payer: Self-pay

## 2020-03-07 ENCOUNTER — Encounter: Payer: Self-pay | Admitting: Internal Medicine

## 2020-03-07 ENCOUNTER — Ambulatory Visit (INDEPENDENT_AMBULATORY_CARE_PROVIDER_SITE_OTHER): Payer: Medicare HMO | Admitting: Internal Medicine

## 2020-03-07 VITALS — BP 110/80 | HR 96 | Temp 98.9°F | Wt 164.1 lb

## 2020-03-07 DIAGNOSIS — E782 Mixed hyperlipidemia: Secondary | ICD-10-CM

## 2020-03-07 DIAGNOSIS — E559 Vitamin D deficiency, unspecified: Secondary | ICD-10-CM

## 2020-03-07 MED ORDER — KETOCONAZOLE 2 % EX SHAM: 1.0000 "application " | MEDICATED_SHAMPOO | CUTANEOUS | 2 refills | Status: DC

## 2020-03-07 NOTE — Progress Notes (Signed)
Established Patient Office Visit     This visit occurred during the SARS-CoV-2 public health emergency.  Safety protocols were in place, including screening questions prior to the visit, additional usage of staff PPE, and extensive cleaning of exam room while observing appropriate contact time as indicated for disinfecting solutions.    CC/Reason for Visit: Follow-up lab work  HPI: Elizabeth Cherry is a 64 y.o. female who is coming in today for the above mentioned reasons.  She had lab work done on 10/7 and wanted to come in person to discuss these results.  Following my review of this lab work she has been diagnosed with vitamin D deficiency and was prescribed 50,000 units weekly x12 weeks.  She was also advised that her cholesterol was again quite elevated with a total cholesterol of 304, LDL cholesterol of 188, triglycerides of 329.  It appears she has been seen at the lipid clinic in the past.  She has a statin intolerance, she states it "triggers my fibromyalgia".  She wants to have her lipids redrawn as she was not fasting the day that her labs were taken.  She states she had some coffee with cream and sugar that day.   Past Medical/Surgical History: Past Medical History:  Diagnosis Date  . Alcoholism ([place])    IN REMISSION ALMOST 20 YEARS  . Allergic rhinitis   . Anorectal hemorrhage   . Anxiety   . Asthma   . Benign neoplasm of colon 10/24/2015  . Borderline personality disorder ([place])   . Breast cancer of upper-inner quadrant of right female breast ([place]) 01/08/2016  . Carotid stenosis, bilateral   . Chronic gastritis without bleeding    OTHER, WITHOUT HEMORRHAGE  . Chronic obstructive pulmonary disease ([place]) 11/15/2011  . Chronic pain 10/24/2011   Overview:  Managed by [facility]. Gets Fentanyl Patches - sees them every other month.   Marland Kitchen COPD (chronic obstructive pulmonary disease) ([place])   . Depression   . Dyslipidemia   . Edema    due to  psychiatric meds per patient, on a diuretic  . Elevated cholesterol   . Fibromyalgia   . Generalized anxiety disorder   . GERD (gastroesophageal reflux disease)   . Hypertension    no meds now  . Irritable bowel syndrome    UNSPECIFIED TYPE  . Malignant neoplasm of unspecified site of unspecified female breast ([place])   . Mixed hyperlipidemia   . Multiple gastric ulcers   . Personal history of colonic polyps   . Psoriasis 02/11/2012  . Sciatica   . Self neglect 10/23/2015  . Severe recurrent major depression without psychotic features ([place]) 10/23/2015  . Tobacco dependence     Past Surgical History:  Procedure Laterality Date  . BREAST LUMPECTOMY WITH RADIOACTIVE SEED AND SENTINEL LYMPH NODE BIOPSY Right 02/14/2016   Procedure: BREAST LUMPECTOMY WITH RADIOACTIVE SEED AND SENTINEL LYMPH NODE BIOPSY;  Surgeon: Excell Seltzer, MD;  Location: [facility];  Service: General;  Laterality: Right;  . DIAGNOSTIC LAPAROSCOPY    . DILATION AND CURETTAGE OF UTERUS    . GYNECOLOGIC CRYOSURGERY      Social History:  reports that she has been smoking cigarettes. She has been smoking about 1.50 packs per day. She has never used smokeless tobacco. She reports that she does not drink alcohol and does not use drugs.  Allergies: Allergies  Allergen Reactions  . Bactrim [Sulfamethoxazole-Trimethoprim] Hives  . Diazepam Other (See Comments)  Severe depression  . Fluoxetine Swelling and Anaphylaxis    Pt reports she is allergic to filler in prozac, her throat swell.  Pt reports she is allergic to filler in prozac, her throat swell.   Pt reports she is allergic to filler in prozac, her throat swell.  Pt reports she is allergic to filler in prozac, her throat swell.  Pt reports she is allergic to filler in prozac, her throat swell.  Pt reports she is allergic to filler in prozac, her throat swell.    . Telmisartan Hives and Other (See Comments)    Other reaction(s): Other (See  Comments) Other Reaction: OTHER REACTION Other Reaction: OTHER REACTION Other reaction(s): Other (See Comments) Other Reaction: OTHER REACTION  Other Reaction: OTHER REACTION Other reaction(s): Other (See Comments) Other Reaction: OTHER REACTION Other Reaction: OTHER REACTION Other reaction(s): Other (See Comments) Other Reaction: OTHER REACTION Other Reaction: OTHER REACTION Other reaction(s): Other (See Comments) Other Reaction: OTHER REACTION Other Reaction: OTHER REACTION Other reaction(s): Other (See Comments) Other Reaction: OTHER REACTION   . Tetracycline Hives and Rash  . Tetracyclines & Related Hives and Rash  . Aspirin Other (See Comments)    Gastric ulcers  Other Reaction: stomach ulcer Gastric ulcers Other Reaction: stomach ulcer Gastric ulcers   . Cefdinir Other (See Comments)    Other reaction(s): Other (See Comments) Other Reaction: EXTREME EXHAUSTION Other Reaction: EXTREME EXHAUSTION Other reaction(s): Other (See Comments) Other Reaction: EXTREME EXHAUSTION  Other Reaction: EXTREME EXHAUSTION Other reaction(s): Other (See Comments) Other Reaction: EXTREME EXHAUSTION Other Reaction: EXTREME EXHAUSTION Other reaction(s): Other (See Comments) Other Reaction: EXTREME EXHAUSTION Other Reaction: EXTREME EXHAUSTION Other reaction(s): Other (See Comments) Other Reaction: EXTREME EXHAUSTION Other Reaction: EXTREME EXHAUSTION Other reaction(s): Other (See Comments) Other Reaction: EXTREME EXHAUSTION   . Clarithromycin Other (See Comments)    unknown  . Nsaids Other (See Comments)    Gastric ulcers  Other Reaction: stomach ulcer Other Reaction: stomach ulcer  Gastric ulcers  . Risperidone Nausea And Vomiting  . Trazodone Other (See Comments)    Dry eyes Dry eyes Dry eyes  Dry eyes Dry eyes Dry eyes Dry eyes Dry eyes Dry eyes Dry eyes Dry eyes   . Atorvastatin     20mg  caused myalgias  . Praluent [Alirocumab]     Joint pain  .  Pravastatin     40mg  caused myalgias  . Repatha [Evolocumab]     Myalgias and flu like symptoms  . Rosuvastatin     myalgias  . Oxycodone Nausea Only    Family History:  Family History  Problem Relation Age of Onset  . Colon cancer Maternal Grandmother      Current Outpatient Medications:  .  acetaminophen (TYLENOL) 500 MG tablet, Take 1,000 mg by mouth every 6 (six) hours as needed., Disp: , Rfl:  .  albuterol (VENTOLIN HFA) 108 (90 Base) MCG/ACT inhaler, Inhale 1-2 puffs into the lungs every 6 (six) hours as needed for wheezing or shortness of breath., Disp: 18 g, Rfl: 2 .  alendronate (FOSAMAX) 70 MG tablet, Take 1 tablet (70 mg total) by mouth once a week. full glass of water on an empty stomach., Disp: 12 tablet, Rfl: 3 .  anastrozole (ARIMIDEX) 1 MG tablet, TAKE (1) TABLET BY MOUTH ONCE DAILY., Disp: 90 tablet, Rfl: 3 .  BREO ELLIPTA 200-25 MCG/INH AEPB, Inhale 1 puff into the lungs daily as needed., Disp: 60 each, Rfl: 2 .  budesonide-formoterol (SYMBICORT) 160-4.5 MCG/ACT inhaler, Inhale 2 puffs into the lungs  daily as needed., Disp: 10.2 g, Rfl: 2 .  chlorthalidone (HYGROTON) 25 MG tablet, Take 1 tablet (25 mg total) by mouth daily., Disp: 90 tablet, Rfl: 1 .  clonazePAM (KLONOPIN) 0.5 MG tablet, Take 1 tablet (0.5 mg total) by mouth 3 (three) times daily as needed (anxiety or sleep)., Disp: 90 tablet, Rfl: 0 .  dicyclomine (BENTYL) 20 MG tablet, Take 20 mg by mouth every 6 (six) hours., Disp: , Rfl:  .  hydrochlorothiazide (HYDRODIURIL) 50 MG tablet, Take 1 tablet (50 mg total) by mouth daily., Disp: 90 tablet, Rfl: 1 .  hydrOXYzine (VISTARIL) 50 MG capsule, Take 2 capsules (100 mg total) by mouth at bedtime., Disp: 90 capsule, Rfl: 1 .  [START ON 03/09/2020] ketoconazole (NIZORAL) 2 % shampoo, Apply 1 application topically 2 (two) times a week., Disp: 120 mL, Rfl: 2 .  montelukast (SINGULAIR) 10 MG tablet, Take 1 tablet (10 mg total) by mouth daily., Disp: 90 tablet, Rfl: 1 .   pantoprazole (PROTONIX) 40 MG tablet, Take 1 tablet (40 mg total) by mouth daily., Disp: 90 tablet, Rfl: 1 .  potassium chloride SA (KLOR-CON) 20 MEQ tablet, Take 1 tablet (20 mEq total) by mouth daily., Disp: 90 tablet, Rfl: 1 .  Probiotic Product (SOLUBLE FIBER/PROBIOTICS PO), Take by mouth., Disp: , Rfl:  .  QUEtiapine (SEROQUEL) 100 MG tablet, Take 100 mg by mouth at bedtime. 150-200mg , Disp: , Rfl:  .  sertraline (ZOLOFT) 100 MG tablet, Take 150 mg by mouth daily., Disp: , Rfl:  .  tiotropium (SPIRIVA HANDIHALER) 18 MCG inhalation capsule, Place 1 capsule (18 mcg total) into inhaler and inhale daily., Disp: 90 capsule, Rfl: 2 .  traMADol (ULTRAM) 50 MG tablet, Take 1 tablet (50 mg total) by mouth 2 (two) times daily., Disp: 60 tablet, Rfl: 1 .  Vitamin D, Ergocalciferol, (DRISDOL) 1.25 MG (50000 UNIT) CAPS capsule, Take 1 capsule (50,000 Units total) by mouth every 7 (seven) days for 12 doses., Disp: 12 capsule, Rfl: 0  Review of Systems:  Constitutional: Denies fever, chills, diaphoresis, appetite change and fatigue.  HEENT: Denies photophobia, eye pain, redness, hearing loss, ear pain, congestion, sore throat, rhinorrhea, sneezing, mouth sores, trouble swallowing, neck pain, neck stiffness and tinnitus.   Respiratory: Denies SOB, DOE, cough, chest tightness,  and wheezing.   Cardiovascular: Denies chest pain, palpitations and leg swelling.  Gastrointestinal: Denies nausea, vomiting, abdominal pain, diarrhea, constipation, blood in stool and abdominal distention.  Genitourinary: Denies dysuria, urgency, frequency, hematuria, flank pain and difficulty urinating.  Endocrine: Denies: hot or cold intolerance, sweats, changes in hair or nails, polyuria, polydipsia. Musculoskeletal: Denies myalgias, back pain, joint swelling, arthralgias and gait problem.  Skin: Denies pallor, rash and wound.  Neurological: Denies dizziness, seizures, syncope, weakness, light-headedness, numbness and headaches.   Hematological: Denies adenopathy. Easy bruising, personal or family bleeding history  Psychiatric/Behavioral: Denies suicidal ideation, mood changes, confusion, nervousness, sleep disturbance and agitation    Physical Exam: Vitals:   03/07/20 1112  BP: 110/80  Pulse: 96  Temp: 98.9 F (37.2 C)  TempSrc: Oral  SpO2: 94%  Weight: 164 lb 1.6 oz (74.4 kg)    Body mass index is 26.89 kg/m.   Constitutional: NAD, calm, comfortable Eyes: PERRL, lids and conjunctivae normal, wears corrective lenses ENMT: Mucous membranes are moist.   Psychiatric: Normal judgment and insight. Alert and oriented x 3.    Impression and Plan:  Vitamin D deficiency -Advised weekly vitamin D x12 months with follow-up levels upon completion.  Mixed  hyperlipidemia -Per her request, we will do another lipid profile.  Do not expect significant changes.  She has an appointment with her cardiologist coming up soon.  I strongly suggest that she discuss this with them.  She is still a heavy smoker, her father died in his 15s with a massive MI.     Lelon Frohlich, MD [facility]

## 2020-03-09 ENCOUNTER — Encounter: Payer: Self-pay | Admitting: Internal Medicine

## 2020-03-14 ENCOUNTER — Other Ambulatory Visit: Payer: Self-pay | Admitting: Internal Medicine

## 2020-03-14 DIAGNOSIS — N289 Disorder of kidney and ureter, unspecified: Secondary | ICD-10-CM

## 2020-03-14 NOTE — Progress Notes (Signed)
Per PCP I created future order for BMP for decreased renal function per last lab results/thx dmf

## 2020-03-15 ENCOUNTER — Encounter: Payer: Self-pay | Admitting: Hematology and Oncology

## 2020-03-21 ENCOUNTER — Other Ambulatory Visit: Payer: Medicare HMO

## 2020-03-27 ENCOUNTER — Encounter: Payer: Self-pay | Admitting: Cardiovascular Disease

## 2020-03-27 NOTE — Progress Notes (Signed)
This encounter was created in error - please disregard.

## 2020-03-28 ENCOUNTER — Encounter: Payer: Medicare HMO | Admitting: Cardiovascular Disease

## 2020-03-28 ENCOUNTER — Telehealth: Payer: Self-pay | Admitting: Cardiovascular Disease

## 2020-03-28 DIAGNOSIS — F339 Major depressive disorder, recurrent, unspecified: Secondary | ICD-10-CM | POA: Diagnosis not present

## 2020-03-28 DIAGNOSIS — F4310 Post-traumatic stress disorder, unspecified: Secondary | ICD-10-CM | POA: Diagnosis not present

## 2020-03-28 DIAGNOSIS — F1021 Alcohol dependence, in remission: Secondary | ICD-10-CM | POA: Diagnosis not present

## 2020-03-28 DIAGNOSIS — G4700 Insomnia, unspecified: Secondary | ICD-10-CM | POA: Diagnosis not present

## 2020-03-28 NOTE — Telephone Encounter (Signed)
Pt c/o medication issue:  1. Name of Medication: hydrochlorothiazide (HYDRODIURIL) 50 MG tablet  2. How are you currently taking this medication (dosage and times per day)? As directed  3. Are you having a reaction (difficulty breathing--STAT)? no  4. What is your medication issue? Patient states that her PCP doubled the dosage on this medication and she wants to make sure that is OK with Dr. Acie Fredrickson. She states that it doesn't seem to be bothering her.

## 2020-03-28 NOTE — Telephone Encounter (Signed)
Spoke with the pt and informed her that per Dr. Acie Fredrickson, it should be ok for her to increase her HCTZ to 50 mg po daily as her PCP advised, but she should have her PCP keep an eye out on her electrolytes.  Pt verbalized understanding and agrees with this plan.

## 2020-03-28 NOTE — Telephone Encounter (Signed)
Patient states that her PCP increased her HCTZ to 50 mg daily and wanted to make sure it was okay with Dr. Acie Fredrickson. She states that she has been feeling good and her BP has been controlled. Advised patient to continue current dose and we will let her know if Dr. Acie Fredrickson has any concerns with it.

## 2020-03-28 NOTE — Telephone Encounter (Signed)
It should be ok .  Your primary MD should keep an eye on your electrolytes.

## 2020-04-12 ENCOUNTER — Encounter: Payer: Self-pay | Admitting: Internal Medicine

## 2020-04-18 MED ORDER — TRAMADOL HCL 50 MG PO TABS: 50.0000 mg | ORAL_TABLET | Freq: Two times a day (BID) | ORAL | 1 refills | Status: DC

## 2020-05-05 DIAGNOSIS — F1021 Alcohol dependence, in remission: Secondary | ICD-10-CM | POA: Diagnosis not present

## 2020-05-05 DIAGNOSIS — G4700 Insomnia, unspecified: Secondary | ICD-10-CM | POA: Diagnosis not present

## 2020-05-05 DIAGNOSIS — F339 Major depressive disorder, recurrent, unspecified: Secondary | ICD-10-CM | POA: Diagnosis not present

## 2020-05-05 DIAGNOSIS — F4310 Post-traumatic stress disorder, unspecified: Secondary | ICD-10-CM | POA: Diagnosis not present

## 2020-05-08 ENCOUNTER — Telehealth: Payer: Self-pay | Admitting: Cardiovascular Disease

## 2020-05-08 NOTE — Telephone Encounter (Signed)
Left message to call back  

## 2020-05-08 NOTE — Telephone Encounter (Signed)
Patient c/o Palpitations:  High priority if patient c/o lightheadedness, shortness of breath, or chest pain  1) How long have you had palpitations/irregular HR/ Afib? Are you having the symptoms now? Palpitations an hour ago.  Random   2) Are you currently experiencing lightheadedness, SOB or CP? Light headedness , denies being lightheaded right now.  She stated it has come and gone from the last 2 weeks /  She stated she also take physic meds that could make her dizzy   3) Do you have a history of afib (atrial fibrillation) or irregular heart rhythm? Yes   4) Have you checked your BP or HR? (document readings if available):  She has not taking it today - bp  HR is 90  5) Are you experiencing any other symptoms?  Shoulder hurts, started  about a month ago   Best number [phone]

## 2020-05-15 ENCOUNTER — Encounter: Payer: Self-pay | Admitting: Internal Medicine

## 2020-05-16 ENCOUNTER — Telehealth (INDEPENDENT_AMBULATORY_CARE_PROVIDER_SITE_OTHER): Payer: Medicare HMO | Admitting: Family Medicine

## 2020-05-16 DIAGNOSIS — R197 Diarrhea, unspecified: Secondary | ICD-10-CM

## 2020-05-16 DIAGNOSIS — R112 Nausea with vomiting, unspecified: Secondary | ICD-10-CM

## 2020-05-16 NOTE — Progress Notes (Signed)
Virtual Visit via Video Note  I connected with Elizabeth Cherry  on 05/16/20 at  6:00 PM EST by a video enabled telemedicine application and verified that I am speaking with the correct person using two identifiers.  Location patient: home, [place] Location provider:work or home office Persons participating in the virtual visit: patient, provider  I discussed the limitations of evaluation and management by telemedicine and the availability of in person appointments. The patient expressed understanding and agreed to proceed.   HPI:  Acute telemedicine visit for bowel issues: -Onset: "when I was a baby" "I had colic as a baby" - has IBS, has history of stomach ulcers when she was in college, also has history of C. Diff after taking abx in the past -this bout of GI symptoms started last week with horrible stress that caused her not to eat for a few days and then she got diarrhea, nausea and vomiting along with abdominal pain, now doing better -no diarrhea or vomiting or abd pain today -Denies: fevers, melena, hematochezia -Has tried:imodium, bentyl, protonix, protonix -COVID-19 vaccine status: has had 2 doses of moderna per chart -she is requesting a narcotic pain medication in case the stomach pain comes back -smoking, eating white bread with cheese ROS: See pertinent positives and negatives per HPI.  Past Medical History:  Diagnosis Date  . Alcoholism ([place])    IN REMISSION ALMOST 20 YEARS  . Allergic rhinitis   . Anorectal hemorrhage   . Anxiety   . Asthma   . Benign neoplasm of colon 10/24/2015  . Borderline personality disorder ([place])   . Breast cancer of upper-inner quadrant of right female breast ([place]) 01/08/2016  . Carotid stenosis, bilateral   . Chronic gastritis without bleeding    OTHER, WITHOUT HEMORRHAGE  . Chronic obstructive pulmonary disease ([place]) 11/15/2011  . Chronic pain 10/24/2011   Overview:  Managed by [facility]. Gets Fentanyl Patches - sees them every  other month.   Marland Kitchen COPD (chronic obstructive pulmonary disease) ([place])   . Depression   . Dyslipidemia   . Edema    due to psychiatric meds per patient, on a diuretic  . Elevated cholesterol   . Fibromyalgia   . Generalized anxiety disorder   . GERD (gastroesophageal reflux disease)   . Hypertension    no meds now  . Irritable bowel syndrome    UNSPECIFIED TYPE  . Malignant neoplasm of unspecified site of unspecified female breast ([place])   . Mixed hyperlipidemia   . Multiple gastric ulcers   . Personal history of colonic polyps   . Psoriasis 02/11/2012  . Sciatica   . Self neglect 10/23/2015  . Severe recurrent major depression without psychotic features ([place]) 10/23/2015  . Tobacco dependence     Past Surgical History:  Procedure Laterality Date  . BREAST LUMPECTOMY WITH RADIOACTIVE SEED AND SENTINEL LYMPH NODE BIOPSY Right 02/14/2016   Procedure: BREAST LUMPECTOMY WITH RADIOACTIVE SEED AND SENTINEL LYMPH NODE BIOPSY;  Surgeon: Excell Seltzer, MD;  Location: [facility];  Service: General;  Laterality: Right;  . DIAGNOSTIC LAPAROSCOPY    . DILATION AND CURETTAGE OF UTERUS    . GYNECOLOGIC CRYOSURGERY       Current Outpatient Medications:  .  acetaminophen (TYLENOL) 500 MG tablet, Take 1,000 mg by mouth every 6 (six) hours as needed., Disp: , Rfl:  .  albuterol (VENTOLIN HFA) 108 (90 Base) MCG/ACT inhaler, Inhale 1-2 puffs into the lungs every 6 (six) hours as needed for wheezing  or shortness of breath., Disp: 18 g, Rfl: 2 .  alendronate (FOSAMAX) 70 MG tablet, Take 1 tablet (70 mg total) by mouth once a week. full glass of water on an empty stomach., Disp: 12 tablet, Rfl: 3 .  anastrozole (ARIMIDEX) 1 MG tablet, TAKE (1) TABLET BY MOUTH ONCE DAILY., Disp: 90 tablet, Rfl: 3 .  BREO ELLIPTA 200-25 MCG/INH AEPB, Inhale 1 puff into the lungs daily as needed., Disp: 60 each, Rfl: 2 .  budesonide-formoterol (SYMBICORT) 160-4.5 MCG/ACT inhaler, Inhale 2 puffs into the lungs  daily as needed., Disp: 10.2 g, Rfl: 2 .  chlorthalidone (HYGROTON) 25 MG tablet, Take 1 tablet (25 mg total) by mouth daily., Disp: 90 tablet, Rfl: 1 .  clonazePAM (KLONOPIN) 0.5 MG tablet, Take 1 tablet (0.5 mg total) by mouth 3 (three) times daily as needed (anxiety or sleep)., Disp: 90 tablet, Rfl: 0 .  dicyclomine (BENTYL) 20 MG tablet, Take 20 mg by mouth every 6 (six) hours., Disp: , Rfl:  .  hydrochlorothiazide (HYDRODIURIL) 50 MG tablet, Take 1 tablet (50 mg total) by mouth daily., Disp: 90 tablet, Rfl: 1 .  hydrOXYzine (VISTARIL) 50 MG capsule, Take 2 capsules (100 mg total) by mouth at bedtime., Disp: 90 capsule, Rfl: 1 .  ketoconazole (NIZORAL) 2 % shampoo, Apply 1 application topically 2 (two) times a week., Disp: 120 mL, Rfl: 2 .  montelukast (SINGULAIR) 10 MG tablet, Take 1 tablet (10 mg total) by mouth daily., Disp: 90 tablet, Rfl: 1 .  pantoprazole (PROTONIX) 40 MG tablet, Take 1 tablet (40 mg total) by mouth daily., Disp: 90 tablet, Rfl: 1 .  potassium chloride SA (KLOR-CON) 20 MEQ tablet, Take 1 tablet (20 mEq total) by mouth daily., Disp: 90 tablet, Rfl: 1 .  Probiotic Product (SOLUBLE FIBER/PROBIOTICS PO), Take by mouth., Disp: , Rfl:  .  QUEtiapine (SEROQUEL) 100 MG tablet, Take 100 mg by mouth at bedtime. 150-200mg , Disp: , Rfl:  .  sertraline (ZOLOFT) 100 MG tablet, Take 150 mg by mouth daily., Disp: , Rfl:  .  tiotropium (SPIRIVA HANDIHALER) 18 MCG inhalation capsule, Place 1 capsule (18 mcg total) into inhaler and inhale daily., Disp: 90 capsule, Rfl: 2 .  traMADol (ULTRAM) 50 MG tablet, Take 1 tablet (50 mg total) by mouth 2 (two) times daily., Disp: 60 tablet, Rfl: 1 .  Vitamin D, Ergocalciferol, (DRISDOL) 1.25 MG (50000 UNIT) CAPS capsule, Take 1 capsule (50,000 Units total) by mouth every 7 (seven) days for 12 doses., Disp: 12 capsule, Rfl: 0  EXAM:  VITALS per patient if applicable:  GENERAL: alert, oriented, appears well and in no acute distress  HEENT:  atraumatic, conjunttiva clear, no obvious abnormalities on inspection of external nose and ears  NECK: normal movements of the head and neck  LUNGS: on inspection no signs of respiratory distress, breathing rate appears normal, no obvious gross SOB, gasping or wheezing  CV: no obvious cyanosis  MS: moves all visible extremities without noticeable abnormality  PSYCH/NEURO: pleasant and cooperative, no obvious depression or anxiety, speech and thought processing grossly intact  ASSESSMENT AND PLAN:  Discussed the following assessment and plan:  Diarrhea, unspecified type  Nausea and vomiting, intractability of vomiting not specified, unspecified vomiting type  -we discussed possible serious and likely etiologies, options for evaluation and workup, limitations of telemedicine visit vs in person visit, treatment, treatment risks and precautions. Pt prefers to treat via telemedicine empirically rather than in person at this moment.  Discussed a variety of potential causes  for diarrhea, nausea and vomiting.  I am glad that she reports her symptoms have resolved today.  She currently has Bentyl, and antiemetic, Imodium and a PPI to take at home.  She wanted a narcotic pain medication, in case the abdominal pain returned.  Did let her know that I am not able to prescribe narcotic or controlled medications via telephone visit.  Also, advised would need an in person evaluation.  Did talk about avoiding dairy and red meat for 1 week and advised given her prior history to seek prompt in-person evaluation if any worsening or severe pain or symptoms are recurrent or persistent.  Also talked about the possibility of COVID-19, as some cases start with that and we are having a very sharp peak in cases.  Discussed options for testing.   Discussed options for inperson care if PCP office not available. Did let this patient know that I only do telemedicine on Tuesdays and Thursdays for [place].  The video visit  failed after the initial few minutes, and we completed this visit and audio.  Spent over 23 minutes on this appointment.  Advised to schedule follow up visit with PCP or U[facility] if any further questions or concerns to avoid delays in care.   I discussed the assessment and treatment plan with the patient. The patient was provided an opportunity to ask questions and all were answered. The patient agreed with the plan and demonstrated an understanding of the instructions.     Terressa Koyanagi, DO

## 2020-05-16 NOTE — Patient Instructions (Signed)
I am glad you are feeling better.  Avoid dairy (including milk, cheese, yogurt, butter and any foods containing dairy) and red meat for 1 week.  Stay hydrated and drink plenty of water.  Seek in person care promptly if your symptoms return, worsen, new concerns arise or you are not continuing to improve.  It was nice to meet you today. I help [facility] out with telemedicine visits on Tuesdays and Thursdays and am available for visits on those days. If you have any concerns or questions following this visit please schedule a follow up visit with your Primary Care doctor or seek care at a local urgent care clinic to avoid delays in care.

## 2020-05-21 IMAGING — CT CT CHEST W/ CM
2 of 3 series · 12 of 36 positions shown, 15 images · IV contrast (iopamidol)
Comparison: None

Correlation: Chest radiographs 04/28/2018

CLINICAL DATA: Abnormal chest radiograph with question LEFT lung
nodule

EXAM:
CT CHEST WITH CONTRAST
TECHNIQUE: Multidetector CT imaging of the chest was performed during
intravenous contrast administration. Sagittal and coronal MPR images
reconstructed from axial data set.
Creatinine was obtained on site at [HOSPITAL] at [REDACTED].
Results: Creatinine 0.8 mg/dL.
CONTRAST:  75mL PE9FVP-5WW IOPAMIDOL (PE9FVP-5WW) INJECTION 61% IV

[Series 2: chest 2.00 br40 s3 ax · axial · 0.62mm/px · z∈[+1561,+1849]mm · 9 of 170 slices shown, 12 images]
[im 13/170  mediastinal]
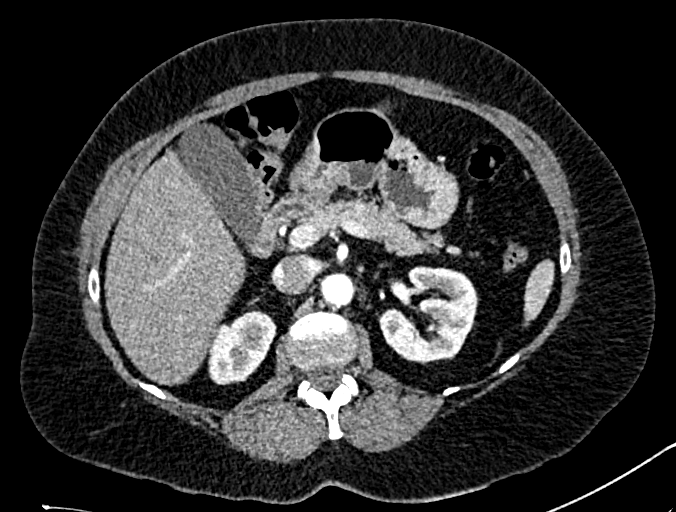
[im 13/170  lung]
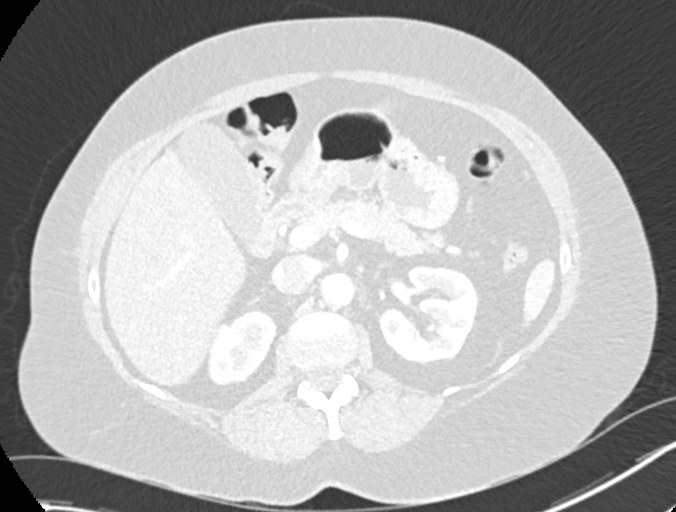
[im 32/170  lung]
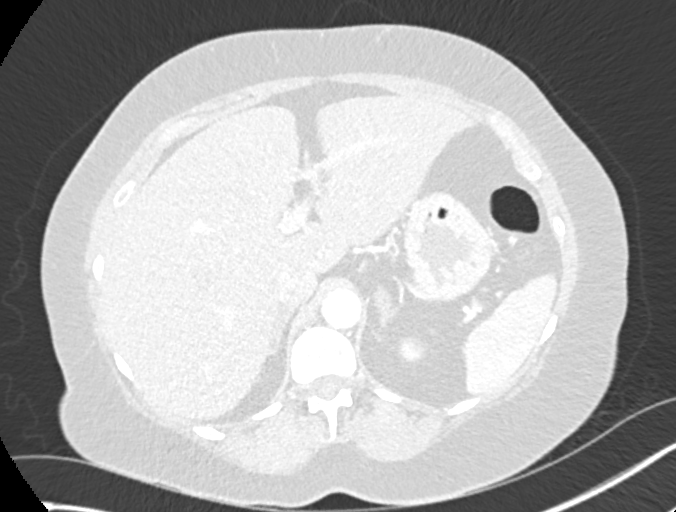
[im 51/170  lung]
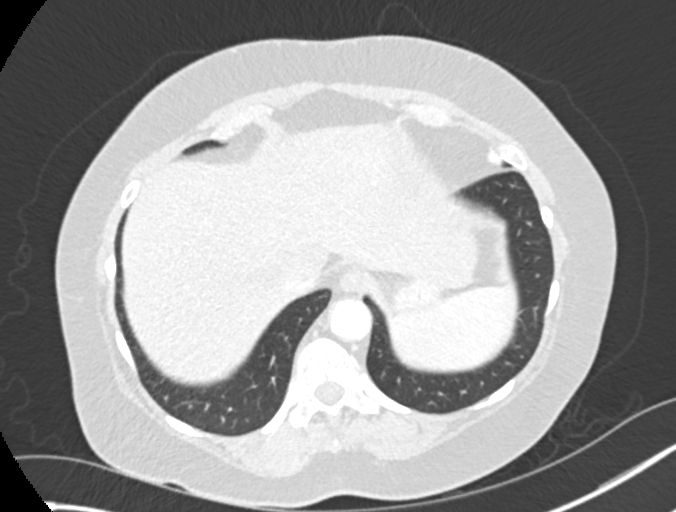
[im 69/170  lung]
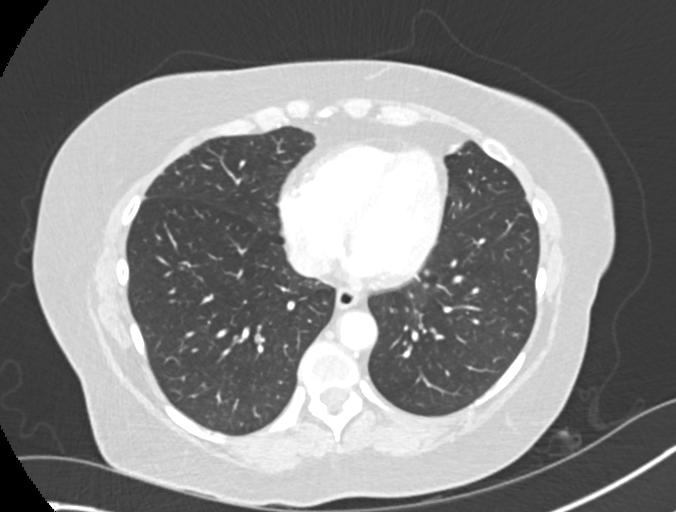
[im 88/170  mediastinal]
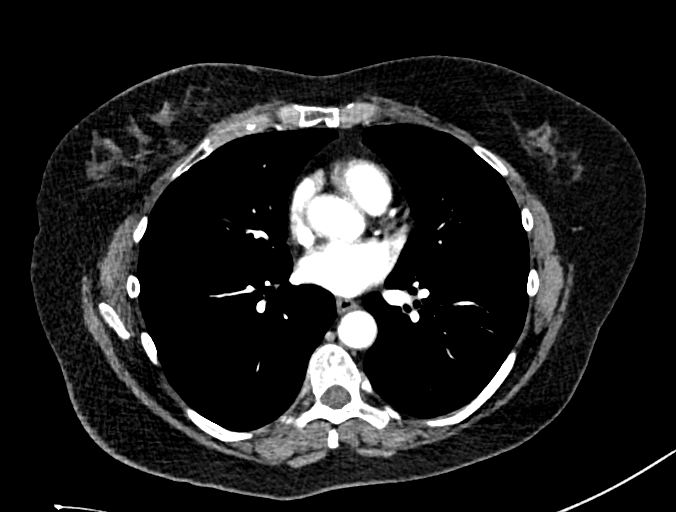
[im 88/170  lung]
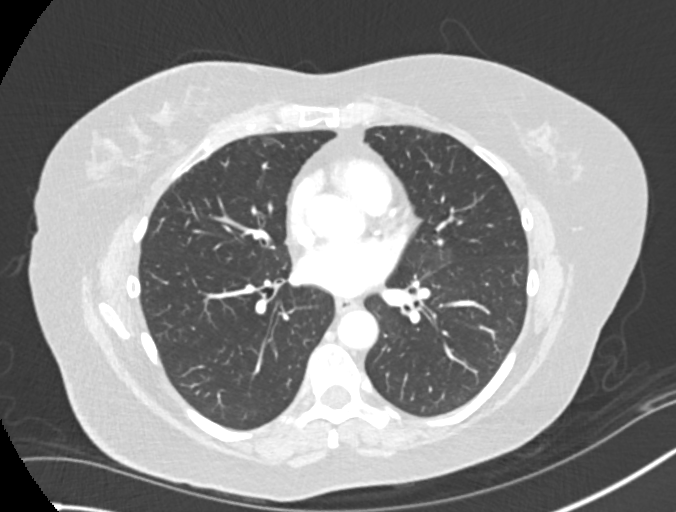
[im 101/170  lung]
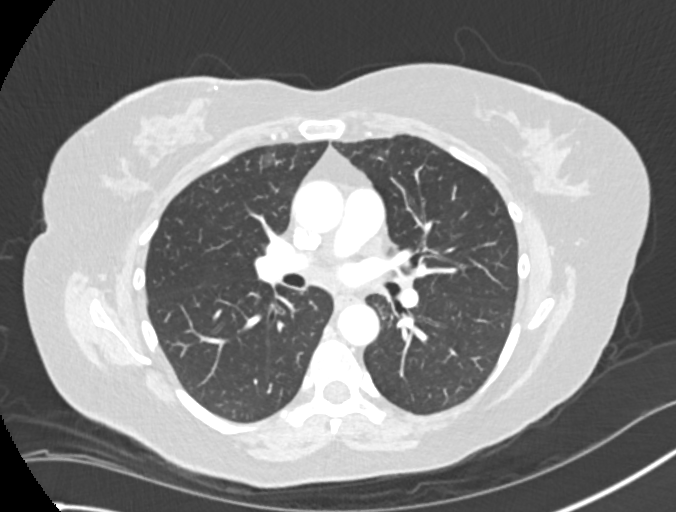
[im 119/170  lung]
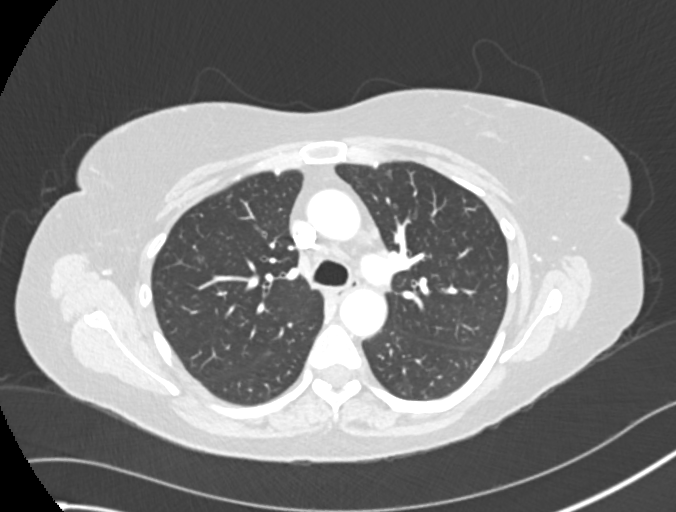
[im 138/170  lung]
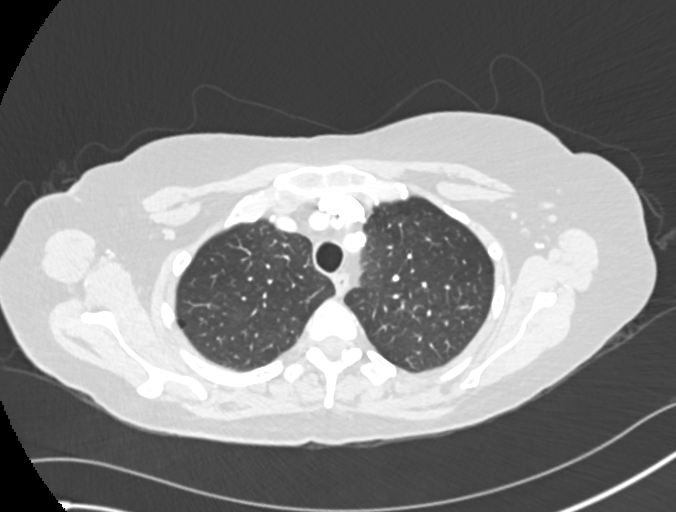
[im 157/170  mediastinal]
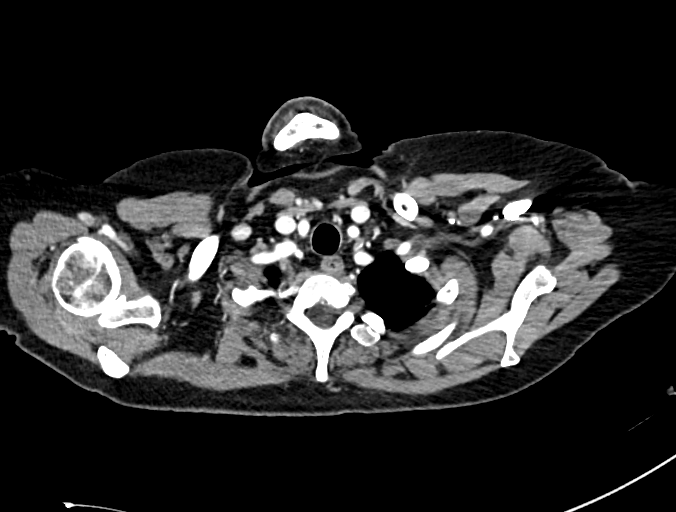
[im 157/170  lung]
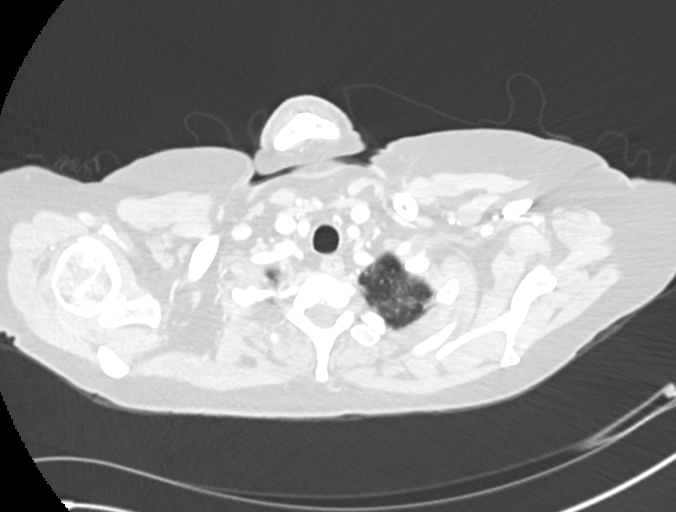

[Series 4: chest 2.00 br40 s3 cor · coronal · 0.67mm/px · 3 of 157 slices shown]
[im 32/157  lung]
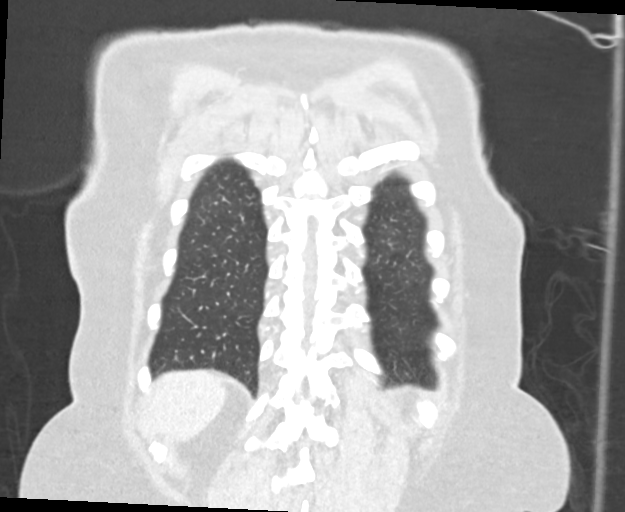
[im 63/157  lung]
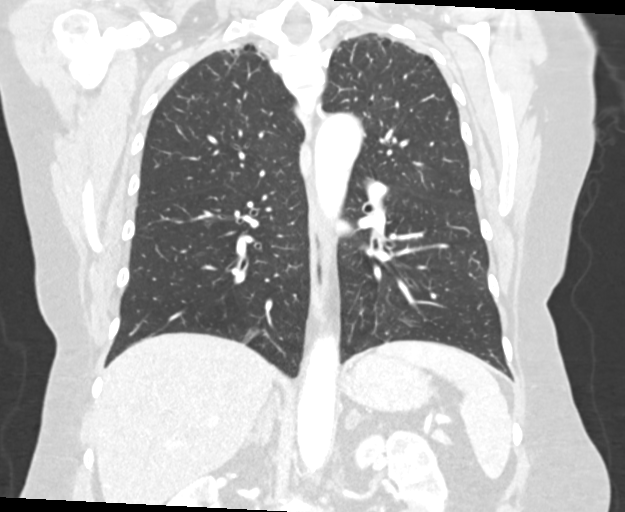
[im 94/157  lung]
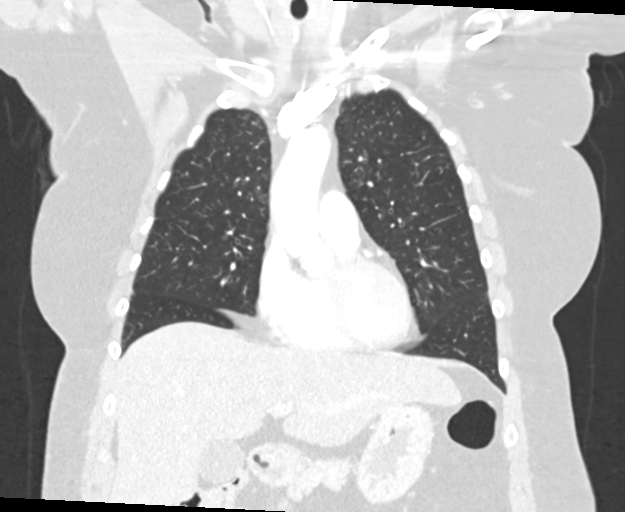

[12 of 36 positions shown; findings below may reference images not displayed]

FINDINGS: Cardiovascular: Atherosclerotic calcifications aorta, proximal great
vessels, minimally at coronary arteries. Aorta normal caliber
without aneurysm or dissection. Pulmonary arteries grossly patent on
non targeted exam. No pericardial effusion.

Mediastinum/Nodes: Esophagus unremarkable. Base of cervical region
normal appearance. No thoracic adenopathy.

Lungs/Pleura: Ground-glass opacities are seen within the RIGHT upper
lobe anteriorly nonspecific but could represent infection. Remaining
lungs clear. No additional infiltrate, pleural effusion or
pneumothorax. No LEFT apex pulmonary nodule is identified;
previously identified radiographic finding likely represented a
summation artifact or external artifact.

Upper Abdomen: BILATERAL adrenal adenomas, 11 x 8 mm RIGHT and 14 x
9 mm LEFT. Remaining visualized upper abdomen unremarkable.

Musculoskeletal: No acute osseous findings.
IMPRESSION: Scattered ground-glass opacities RIGHT upper lobe nonspecific but
could represent infection.

Remainder of exam normal.

No evidence of pulmonary nodule.

BILATERAL small adrenal adenomas.

Aortic Atherosclerosis (PT1L9-F56.6).

## 2020-05-28 ENCOUNTER — Encounter: Payer: Self-pay | Admitting: Internal Medicine

## 2020-05-29 ENCOUNTER — Encounter: Payer: Self-pay | Admitting: Internal Medicine

## 2020-05-30 ENCOUNTER — Other Ambulatory Visit: Payer: Self-pay

## 2020-05-30 ENCOUNTER — Encounter: Payer: Self-pay | Admitting: Internal Medicine

## 2020-05-30 ENCOUNTER — Telehealth (INDEPENDENT_AMBULATORY_CARE_PROVIDER_SITE_OTHER): Payer: Medicare HMO | Admitting: Internal Medicine

## 2020-05-30 DIAGNOSIS — B370 Candidal stomatitis: Secondary | ICD-10-CM | POA: Diagnosis not present

## 2020-05-30 DIAGNOSIS — J069 Acute upper respiratory infection, unspecified: Secondary | ICD-10-CM

## 2020-05-30 MED ORDER — FLUCONAZOLE 100 MG PO TABS: 100.0000 mg | ORAL_TABLET | Freq: Every day | ORAL | 0 refills | Status: AC

## 2020-05-30 NOTE — Progress Notes (Signed)
Virtual Visit via Telephone Note  I connected with Elizabeth Cherry on 05/30/20 at  4:15 PM EST by telephone and verified that I am speaking with the correct person using two identifiers.   I discussed the limitations, risks, security and privacy concerns of performing an evaluation and management service by telephone and the availability of in person appointments. I also discussed with the patient that there may be a patient responsible charge related to this service. The patient expressed understanding and agreed to proceed.  Location patient: home Location provider: work office Participants present for the call: patient, provider Patient did not have a visit in the prior 7 days to address this/these issue(s).   History of Present Illness:  She has scheduled this visit to discuss a couple issues:  1.  She again has oral thrush and is requesting refill of her fluconazole prescription.  Prior episodes of thrush had been related to her steroid inhaler.  2.  She has had some sinus congestion for the past 2 days.  Dry mouth, pain between her eyebrows and teeth.  She has not been COVID tested, she has received all 3 vaccines.   Observations/Objective: Patient sounds cheerful and well on the phone. I do not appreciate any increased work of breathing. Speech and thought processing are grossly intact. Patient reported vitals: None reported   Current Outpatient Medications:  .  acetaminophen (TYLENOL) 500 MG tablet, Take 1,000 mg by mouth every 6 (six) hours as needed., Disp: , Rfl:  .  albuterol (VENTOLIN HFA) 108 (90 Base) MCG/ACT inhaler, Inhale 1-2 puffs into the lungs every 6 (six) hours as needed for wheezing or shortness of breath., Disp: 18 g, Rfl: 2 .  alendronate (FOSAMAX) 70 MG tablet, Take 1 tablet (70 mg total) by mouth once a week. full glass of water on an empty stomach., Disp: 12 tablet, Rfl: 3 .  anastrozole (ARIMIDEX) 1 MG tablet, TAKE (1) TABLET BY MOUTH ONCE DAILY.,  Disp: 90 tablet, Rfl: 3 .  BREO ELLIPTA 200-25 MCG/INH AEPB, Inhale 1 puff into the lungs daily as needed., Disp: 60 each, Rfl: 2 .  budesonide-formoterol (SYMBICORT) 160-4.5 MCG/ACT inhaler, Inhale 2 puffs into the lungs daily as needed., Disp: 10.2 g, Rfl: 2 .  chlorthalidone (HYGROTON) 25 MG tablet, Take 1 tablet (25 mg total) by mouth daily., Disp: 90 tablet, Rfl: 1 .  clonazePAM (KLONOPIN) 0.5 MG tablet, Take 1 tablet (0.5 mg total) by mouth 3 (three) times daily as needed (anxiety or sleep)., Disp: 90 tablet, Rfl: 0 .  dicyclomine (BENTYL) 20 MG tablet, Take 20 mg by mouth every 6 (six) hours., Disp: , Rfl:  .  fluconazole (DIFLUCAN) 100 MG tablet, Take 1 tablet (100 mg total) by mouth daily for 5 days., Disp: 5 tablet, Rfl: 0 .  hydrochlorothiazide (HYDRODIURIL) 50 MG tablet, Take 1 tablet (50 mg total) by mouth daily., Disp: 90 tablet, Rfl: 1 .  hydrOXYzine (VISTARIL) 50 MG capsule, Take 2 capsules (100 mg total) by mouth at bedtime., Disp: 90 capsule, Rfl: 1 .  ketoconazole (NIZORAL) 2 % shampoo, Apply 1 application topically 2 (two) times a week., Disp: 120 mL, Rfl: 2 .  montelukast (SINGULAIR) 10 MG tablet, Take 1 tablet (10 mg total) by mouth daily., Disp: 90 tablet, Rfl: 1 .  pantoprazole (PROTONIX) 40 MG tablet, Take 1 tablet (40 mg total) by mouth daily., Disp: 90 tablet, Rfl: 1 .  potassium chloride SA (KLOR-CON) 20 MEQ tablet, Take 1 tablet (20 mEq  total) by mouth daily., Disp: 90 tablet, Rfl: 1 .  Probiotic Product (SOLUBLE FIBER/PROBIOTICS PO), Take by mouth., Disp: , Rfl:  .  QUEtiapine (SEROQUEL) 100 MG tablet, Take 100 mg by mouth at bedtime. 150-200mg , Disp: , Rfl:  .  sertraline (ZOLOFT) 100 MG tablet, Take 150 mg by mouth daily., Disp: , Rfl:  .  tiotropium (SPIRIVA HANDIHALER) 18 MCG inhalation capsule, Place 1 capsule (18 mcg total) into inhaler and inhale daily., Disp: 90 capsule, Rfl: 2 .  traMADol (ULTRAM) 50 MG tablet, Take 1 tablet (50 mg total) by mouth 2 (two) times  daily., Disp: 60 tablet, Rfl: 1  Review of Systems:  Constitutional: Denies fever, chills, diaphoresis, appetite change and fatigue.  HEENT: Denies photophobia, eye pain, redness, hearing loss, ear pain,  mouth sores, trouble swallowing, neck pain, neck stiffness and tinnitus.   Respiratory: Denies SOB, DOE, cough, chest tightness,  and wheezing.   Cardiovascular: Denies chest pain, palpitations and leg swelling.  Gastrointestinal: Denies nausea, vomiting, abdominal pain, diarrhea, constipation, blood in stool and abdominal distention.  Genitourinary: Denies dysuria, urgency, frequency, hematuria, flank pain and difficulty urinating.  Endocrine: Denies: hot or cold intolerance, sweats, changes in hair or nails, polyuria, polydipsia. Musculoskeletal: Denies myalgias, back pain, joint swelling, arthralgias and gait problem.  Skin: Denies pallor, rash and wound.  Neurological: Denies dizziness, seizures, syncope, weakness, light-headedness, numbness and headaches.  Hematological: Denies adenopathy. Easy bruising, personal or family bleeding history  Psychiatric/Behavioral: Denies suicidal ideation, mood changes, confusion, nervousness, sleep disturbance and agitation   Assessment and Plan:  Viral URI -Given presentation today, PNA, pharyngitis, ear infection are not likely, hence abx have not been prescribed. -Have advised rest, fluids, OTC antihistamines, cough suppressants and mucinex. -RTC if no improvement in 10-14 days.   Thrush, oral  - Plan: fluconazole (DIFLUCAN) 100 MG tablet daily for 5 days.    I discussed the assessment and treatment plan with the patient. The patient was provided an opportunity to ask questions and all were answered. The patient agreed with the plan and demonstrated an understanding of the instructions.   The patient was advised to call back or seek an in-person evaluation if the symptoms worsen or if the condition fails to improve as anticipated.  I  provided 24 minutes of non-face-to-face time during this encounter.   Lelon Frohlich, MD [facility] at [facility]

## 2020-05-30 NOTE — Telephone Encounter (Signed)
Virtual appointment scheduled. 

## 2020-05-31 IMAGING — CT CT ABDOMEN W/O CM
2 of 4 series · 16 of 46 positions shown, 18 images · non-contrast
Comparison: Partial comparison to CT chest dated 05/12/2018

CLINICAL DATA: Adrenal adenoma, nausea, C diff with weight loss.
History of breast cancer status post lumpectomy and radiation.

EXAM:
CT ABDOMEN WITHOUT CONTRAST
TECHNIQUE: Multidetector CT imaging of the abdomen was performed following the
standard protocol without IV contrast.

[Series 2: adrenals 2.00 br40 s3 axial st · axial · 0.76mm/px · z∈[+1285,+1553]mm · 13 of 148 slices shown, 15 images]
[im 7/148  soft-tissue]
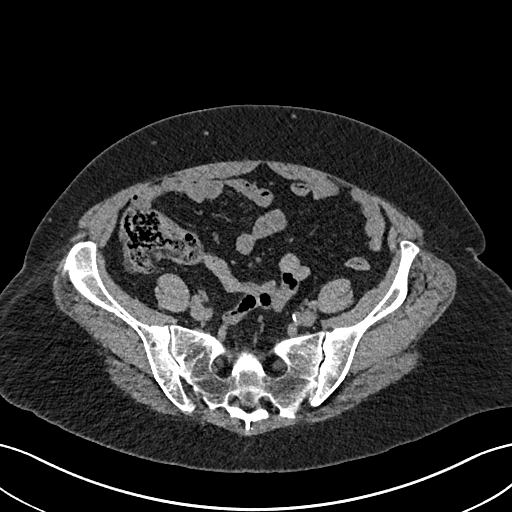
[im 7/148  bone]
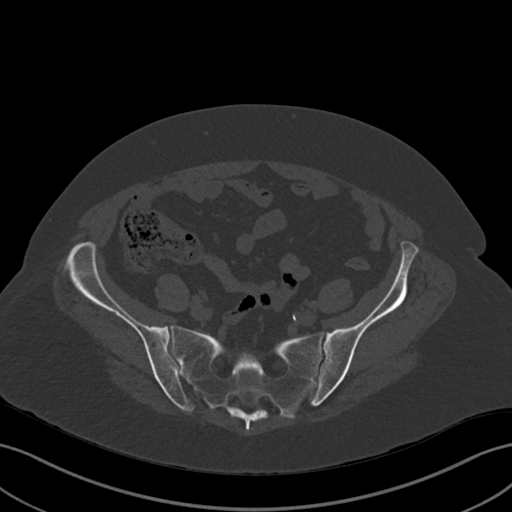
[im 20/148  soft-tissue]
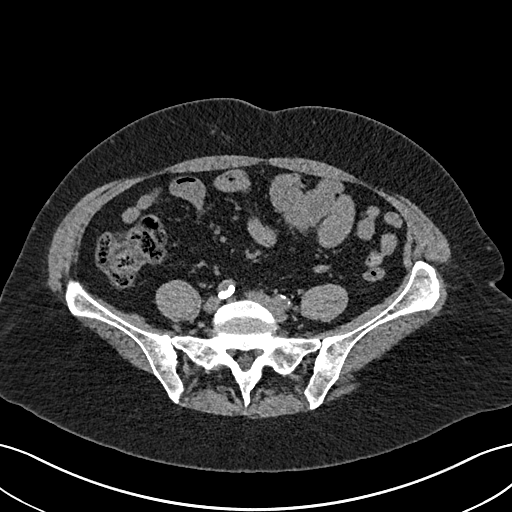
[im 32/148  soft-tissue]
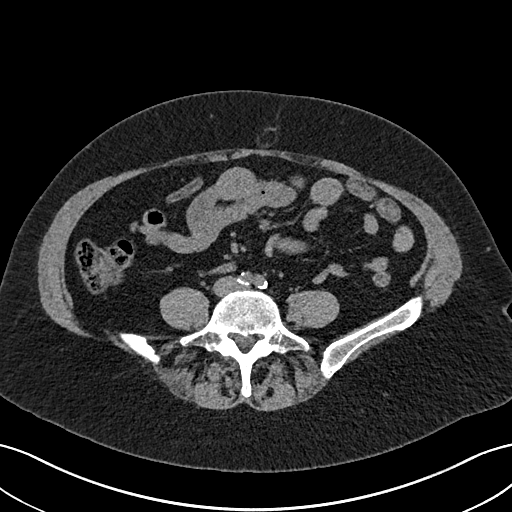
[im 39/148  soft-tissue]
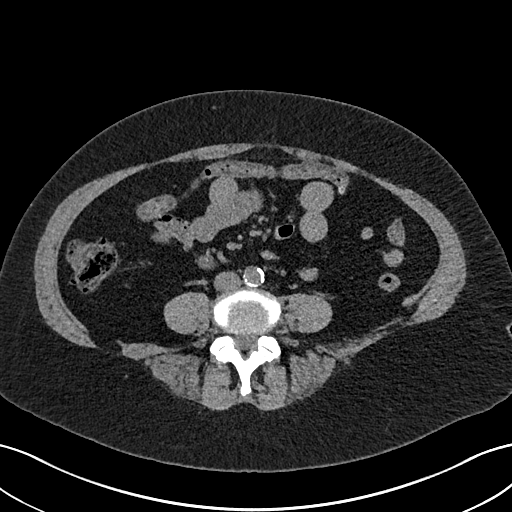
[im 52/148  soft-tissue]
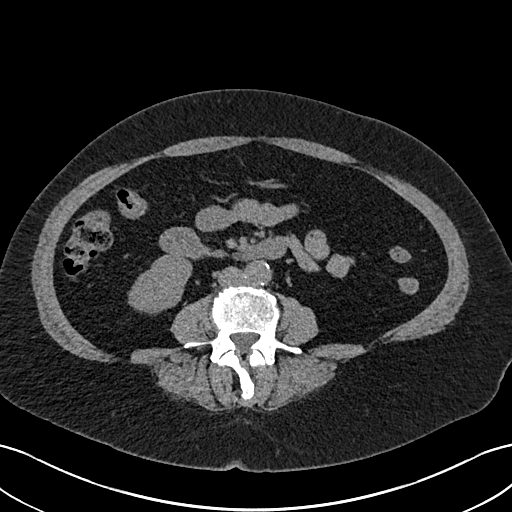
[im 64/148  soft-tissue]
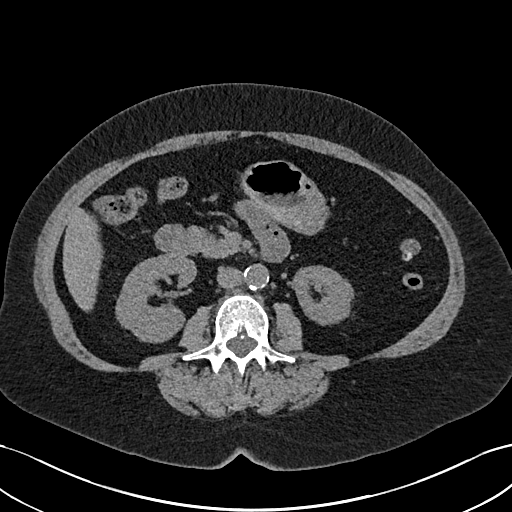
[im 77/148  soft-tissue]
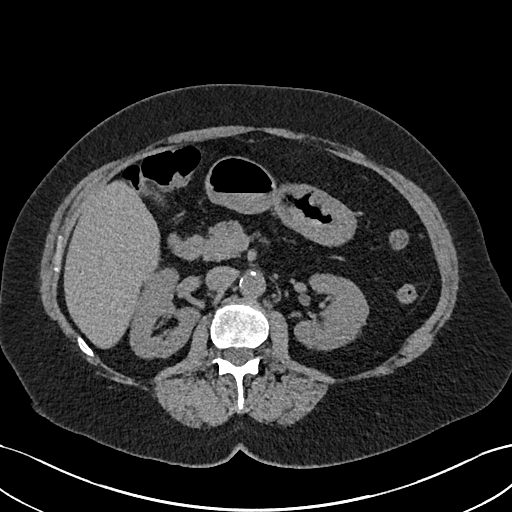
[im 84/148  soft-tissue]
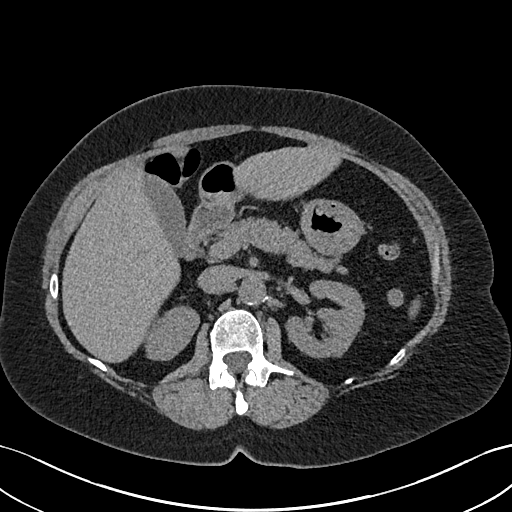
[im 96/148  soft-tissue]
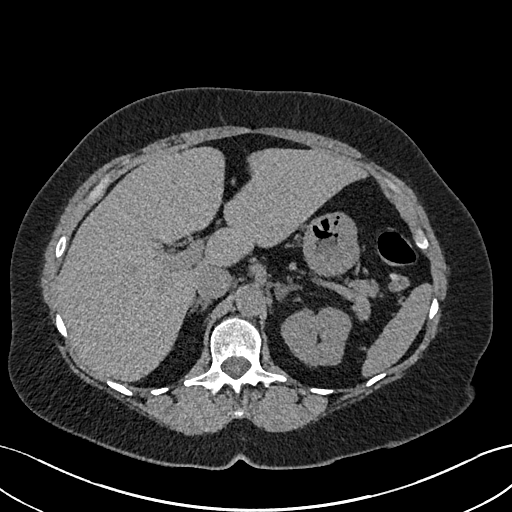
[im 96/148  bone]
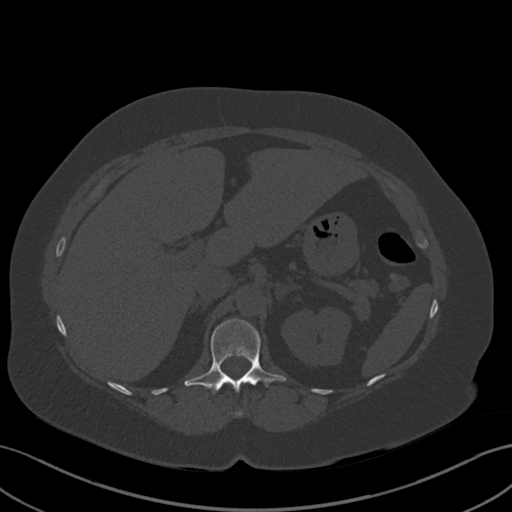
[im 109/148  soft-tissue]
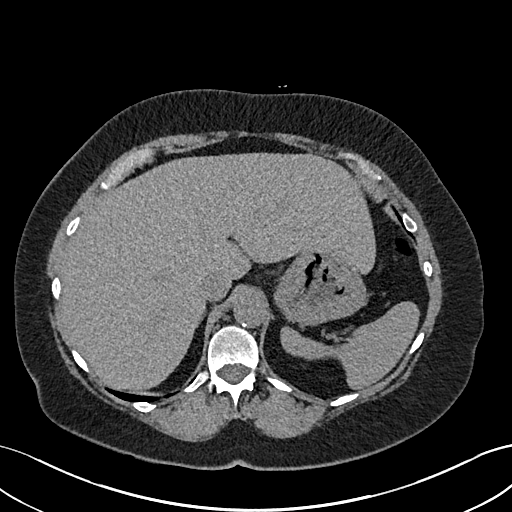
[im 116/148  soft-tissue]
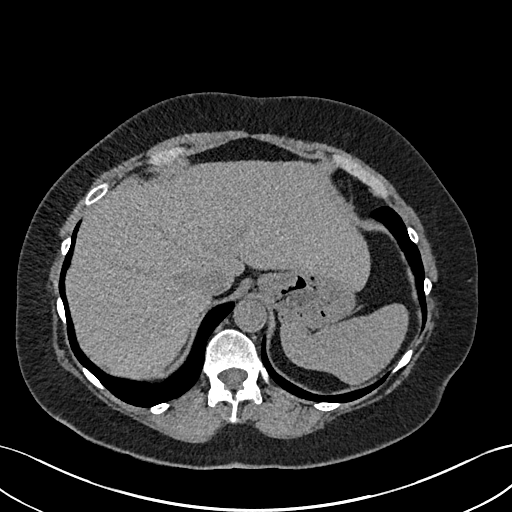
[im 128/148  soft-tissue]
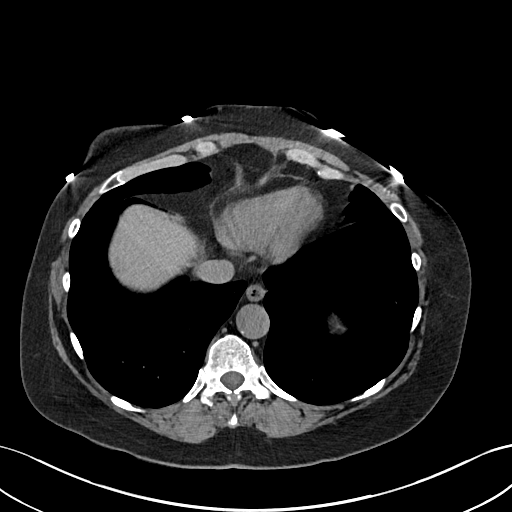
[im 141/148  soft-tissue]
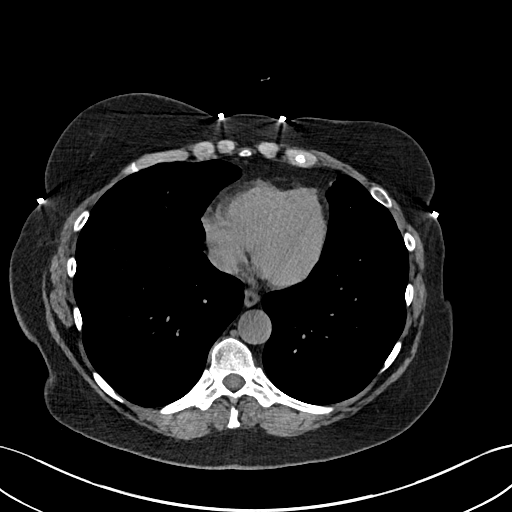

[Series 5: adrenals 2.00 br40 s3 cor cor st · coronal · 0.58mm/px · 3 of 165 slices shown]
[im 55/165  soft-tissue]
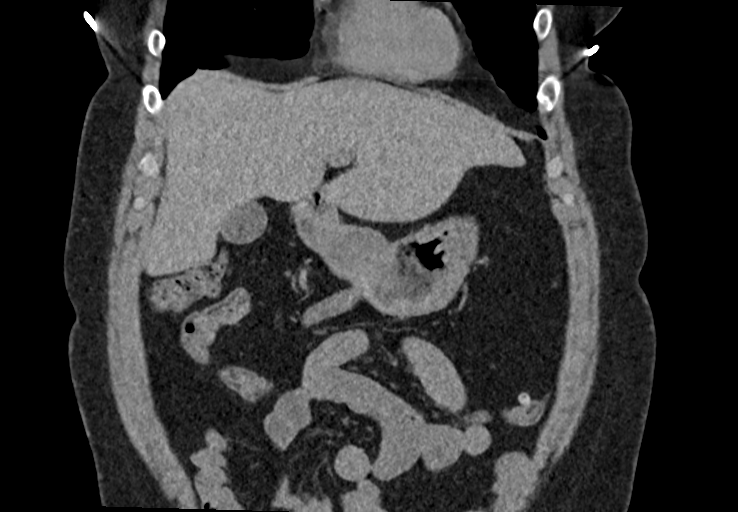
[im 73/165  soft-tissue]
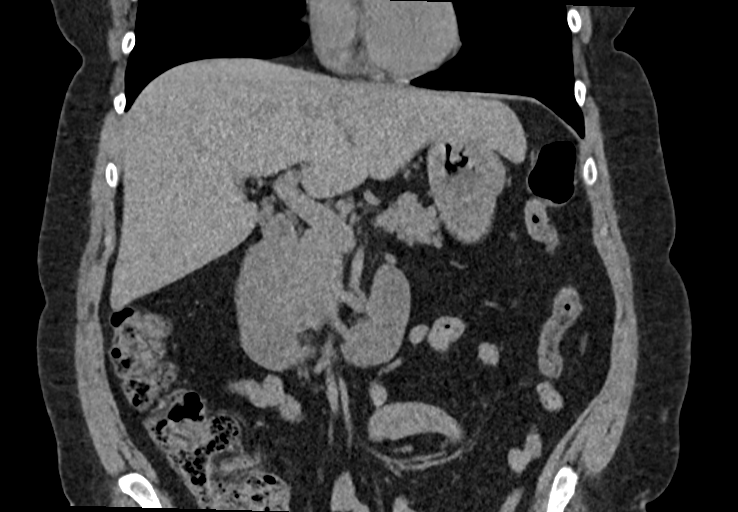
[im 92/165  soft-tissue]
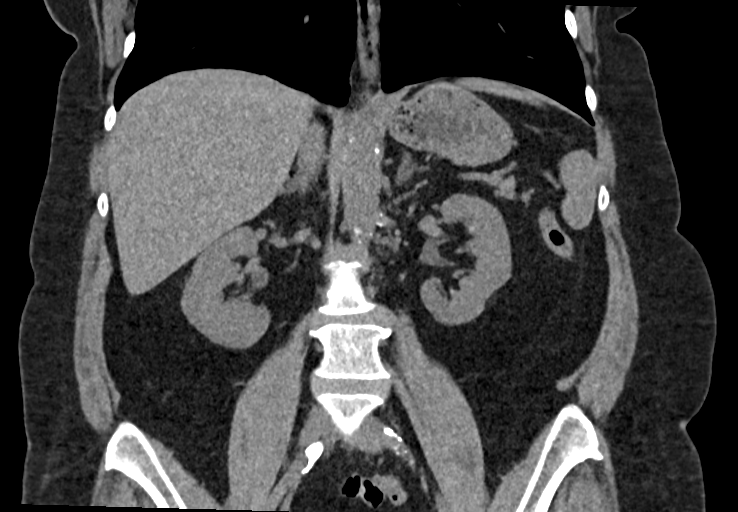

[16 of 46 positions shown; findings below may reference images not displayed]

FINDINGS: Lower chest: Lung bases are clear.

Hepatobiliary: Unenhanced liver is unremarkable.

Gallbladder is unremarkable. No intrahepatic or extrahepatic ductal
dilatation.

Pancreas: Within normal limits.

Spleen: Within normal limits.

Adrenals/Urinary Tract: Small bilateral adrenal adenomas, measuring
1.5 cm on the right (series 2/image 47) and 1.3 and 1.0 cm on the
left (series 2/images 46 and 50).

Kidneys are within normal limits. No renal calculi or
hydronephrosis.

Stomach/Bowel: Stomach is within normal limits.

Visualized bowel is unremarkable.

Vascular/Lymphatic: No evidence of abdominal aortic aneurysm.

No suspicious abdominal lymphadenopathy.

Other: No abdominal ascites.

Musculoskeletal: Visualized osseous structures are within normal
limits.
IMPRESSION: Small bilateral adrenal adenomas, measuring up to 1.5 cm on the
right and 1.3 cm on the left, benign.

No findings suspicious for metastatic disease in the abdomen. Please
note that the pelvis was not imaged.

## 2020-06-06 NOTE — Addendum Note (Signed)
Addended by: Marrion Coy on: 06/06/2020 02:12 PM   Modules accepted: Orders

## 2020-06-06 NOTE — Addendum Note (Signed)
Addended by: Marrion Coy on: 06/06/2020 02:17 PM   Modules accepted: Orders

## 2020-06-12 ENCOUNTER — Ambulatory Visit: Payer: Medicare HMO | Admitting: Cardiovascular Disease

## 2020-06-13 ENCOUNTER — Encounter: Payer: Self-pay | Admitting: Internal Medicine

## 2020-06-13 MED ORDER — TRAMADOL HCL 50 MG PO TABS: 50.0000 mg | ORAL_TABLET | Freq: Two times a day (BID) | ORAL | 1 refills | Status: DC

## 2020-06-15 ENCOUNTER — Telehealth: Payer: Medicare HMO | Admitting: Internal Medicine

## 2020-06-15 ENCOUNTER — Ambulatory Visit: Payer: Medicare HMO | Admitting: Cardiovascular Disease

## 2020-06-21 ENCOUNTER — Other Ambulatory Visit: Payer: Medicare HMO

## 2020-06-26 DIAGNOSIS — F1021 Alcohol dependence, in remission: Secondary | ICD-10-CM | POA: Diagnosis not present

## 2020-06-26 DIAGNOSIS — F339 Major depressive disorder, recurrent, unspecified: Secondary | ICD-10-CM | POA: Diagnosis not present

## 2020-06-26 DIAGNOSIS — F4310 Post-traumatic stress disorder, unspecified: Secondary | ICD-10-CM | POA: Diagnosis not present

## 2020-06-26 DIAGNOSIS — G4700 Insomnia, unspecified: Secondary | ICD-10-CM | POA: Diagnosis not present

## 2020-07-04 DIAGNOSIS — M797 Fibromyalgia: Secondary | ICD-10-CM | POA: Diagnosis not present

## 2020-07-04 DIAGNOSIS — B370 Candidal stomatitis: Secondary | ICD-10-CM | POA: Diagnosis not present

## 2020-07-04 DIAGNOSIS — M1990 Unspecified osteoarthritis, unspecified site: Secondary | ICD-10-CM | POA: Diagnosis not present

## 2020-07-04 DIAGNOSIS — F329 Major depressive disorder, single episode, unspecified: Secondary | ICD-10-CM | POA: Diagnosis not present

## 2020-07-04 DIAGNOSIS — F419 Anxiety disorder, unspecified: Secondary | ICD-10-CM | POA: Diagnosis not present

## 2020-07-04 DIAGNOSIS — G43909 Migraine, unspecified, not intractable, without status migrainosus: Secondary | ICD-10-CM | POA: Diagnosis not present

## 2020-07-04 DIAGNOSIS — I10 Essential (primary) hypertension: Secondary | ICD-10-CM | POA: Diagnosis not present

## 2020-07-04 DIAGNOSIS — I499 Cardiac arrhythmia, unspecified: Secondary | ICD-10-CM | POA: Diagnosis not present

## 2020-07-04 DIAGNOSIS — J45909 Unspecified asthma, uncomplicated: Secondary | ICD-10-CM | POA: Diagnosis not present

## 2020-07-04 DIAGNOSIS — K5900 Constipation, unspecified: Secondary | ICD-10-CM | POA: Diagnosis not present

## 2020-07-28 DIAGNOSIS — G4700 Insomnia, unspecified: Secondary | ICD-10-CM | POA: Diagnosis not present

## 2020-07-28 DIAGNOSIS — F339 Major depressive disorder, recurrent, unspecified: Secondary | ICD-10-CM | POA: Diagnosis not present

## 2020-07-28 DIAGNOSIS — F4310 Post-traumatic stress disorder, unspecified: Secondary | ICD-10-CM | POA: Diagnosis not present

## 2020-07-28 DIAGNOSIS — F1021 Alcohol dependence, in remission: Secondary | ICD-10-CM | POA: Diagnosis not present

## 2020-08-03 DIAGNOSIS — M9901 Segmental and somatic dysfunction of cervical region: Secondary | ICD-10-CM | POA: Diagnosis not present

## 2020-08-03 DIAGNOSIS — M9902 Segmental and somatic dysfunction of thoracic region: Secondary | ICD-10-CM | POA: Diagnosis not present

## 2020-08-03 DIAGNOSIS — M9903 Segmental and somatic dysfunction of lumbar region: Secondary | ICD-10-CM | POA: Diagnosis not present

## 2020-08-03 DIAGNOSIS — M5430 Sciatica, unspecified side: Secondary | ICD-10-CM | POA: Diagnosis not present

## 2020-08-07 DIAGNOSIS — M9901 Segmental and somatic dysfunction of cervical region: Secondary | ICD-10-CM | POA: Diagnosis not present

## 2020-08-07 DIAGNOSIS — M5430 Sciatica, unspecified side: Secondary | ICD-10-CM | POA: Diagnosis not present

## 2020-08-07 DIAGNOSIS — M9902 Segmental and somatic dysfunction of thoracic region: Secondary | ICD-10-CM | POA: Diagnosis not present

## 2020-08-07 DIAGNOSIS — M9903 Segmental and somatic dysfunction of lumbar region: Secondary | ICD-10-CM | POA: Diagnosis not present

## 2020-08-10 DIAGNOSIS — M9903 Segmental and somatic dysfunction of lumbar region: Secondary | ICD-10-CM | POA: Diagnosis not present

## 2020-08-10 DIAGNOSIS — M5430 Sciatica, unspecified side: Secondary | ICD-10-CM | POA: Diagnosis not present

## 2020-08-10 DIAGNOSIS — M9902 Segmental and somatic dysfunction of thoracic region: Secondary | ICD-10-CM | POA: Diagnosis not present

## 2020-08-10 DIAGNOSIS — M9901 Segmental and somatic dysfunction of cervical region: Secondary | ICD-10-CM | POA: Diagnosis not present

## 2020-09-15 ENCOUNTER — Telehealth: Payer: Self-pay | Admitting: *Deleted

## 2020-09-15 NOTE — Telephone Encounter (Signed)
Received call from pt requesting MD to refill Tramadol prescription.  States PCP office has been contacted several times over the last week with no return call and no refill sent to the pharmacy. MD out of office and RN educated pt that PCP is the original prescribing provider and would need to be the one to continue refilling this medication.  Pt verbalized understanding and states she will continue to contact their office.

## 2020-10-31 ENCOUNTER — Other Ambulatory Visit: Payer: Self-pay | Admitting: Internal Medicine

## 2021-01-01 ENCOUNTER — Other Ambulatory Visit: Payer: Self-pay | Admitting: *Deleted

## 2021-01-01 DIAGNOSIS — C50211 Malignant neoplasm of upper-inner quadrant of right female breast: Secondary | ICD-10-CM

## 2021-01-01 NOTE — Progress Notes (Signed)
No note

## 2021-01-16 ENCOUNTER — Other Ambulatory Visit: Payer: Self-pay

## 2021-01-16 ENCOUNTER — Telehealth: Payer: Self-pay

## 2021-01-16 DIAGNOSIS — Z170 Estrogen receptor positive status [ER+]: Secondary | ICD-10-CM

## 2021-01-16 DIAGNOSIS — C50211 Malignant neoplasm of upper-inner quadrant of right female breast: Secondary | ICD-10-CM

## 2021-01-16 NOTE — Telephone Encounter (Signed)
RN received call from patient regarding need for order for diagnostic mammogram sent to [facility].    RN reviewed, patient is due for diagnostic mammogram in October.  Order completed, and successfully faxed to [facility].  Pt will call clinic once scheduled to have MD follow up rescheduled post mammogram.   Pt notified, no further needs at this time.

## 2021-02-05 ENCOUNTER — Telehealth: Payer: Self-pay | Admitting: *Deleted

## 2021-02-05 ENCOUNTER — Ambulatory Visit: Payer: Medicare HMO | Admitting: Hematology and Oncology

## 2021-02-05 NOTE — Chronic Care Management (AMB) (Signed)
  Chronic Care Management   Outreach Note  02/05/2021 Name: Elizabeth Cherry MRN: [id number] DOB: 1956-05-13  Elizabeth Cherry is a 65 y.o. year old female who is a primary care patient of Isaac Bliss, Rayford Halsted, MD. I reached out to Elizabeth Cherry today in response to a referral sent by Elizabeth Cherry's Isaac Bliss, Rayford Halsted, MD     An unsuccessful telephone outreach was attempted today. The patient was referred to the case management team for assistance with care management and care coordination.   Follow Up Plan: A HIPAA compliant Cherry message was left for the patient providing contact information and requesting a return call.  The care management team will reach out to the patient again over the next 7 days.  If patient returns call to provider office, please advise to call [facility]* at [phone].*  [facility]  Direct Dial: [phone]

## 2021-02-06 NOTE — Chronic Care Management (AMB) (Signed)
Spoke with patient she has a new PCP Dr. Lorel Monaco at [place].   [facility]  Direct Dial: [phone]

## 2021-02-27 ENCOUNTER — Ambulatory Visit: Payer: Medicare HMO | Admitting: Hematology and Oncology

## 2021-03-19 ENCOUNTER — Inpatient Hospital Stay: Payer: Medicare HMO | Admitting: Hematology and Oncology

## 2021-03-22 ENCOUNTER — Ambulatory Visit: Payer: Medicare HMO | Admitting: Hematology and Oncology

## 2021-03-29 ENCOUNTER — Encounter: Payer: Self-pay | Admitting: Hematology and Oncology

## 2021-04-05 ENCOUNTER — Other Ambulatory Visit: Payer: Self-pay | Admitting: Internal Medicine

## 2021-04-09 NOTE — Progress Notes (Incomplete)
Patient Care Team: Isaac Bliss, Rayford Halsted, MD as PCP - General (Internal Medicine) Nahser, Wonda Cheng, MD as PCP - Cardiology (Cardiology) Excell Seltzer, MD (Inactive) as Consulting Physician (General Surgery) Nicholas Lose, MD as Consulting Physician (Hematology and Oncology) Kyung Rudd, MD as Consulting Physician (Radiation Oncology) Delice Bison Charlestine Massed, NP as Nurse Practitioner (Hematology and Oncology)  DIAGNOSIS: No diagnosis found.  SUMMARY OF ONCOLOGIC HISTORY: Oncology History  Breast cancer of upper-inner quadrant of right female breast ([place])  01/03/2016 Initial Diagnosis   Palpable right breast abnormality 1.6 cm at 2:00 position axilla negative ultrasound biopsy invasive lobular cancer grade 1, ER 95%, PR 95%, Ki-67 10%, HER-2 negative ratio 1.31, T1 cN0 stage IA clinical stage   02/14/2016 Surgery   Right lumpectomy: Invasive lobular cancer, grade 1, 1.8 cm, margins negative but close, 0/1 lymph node negative, T1 CN 0 stage IA, ER 95%, PR 95%, Ki-67 10%, HER-2 negative ratio 1.31   03/20/2016 - 05/08/2016 Radiation Therapy   Adjuvant radiation therapy at [facility]   05/20/2016 -  Anti-estrogen oral therapy   Anastrozole 1 mg by mouth daily     CHIEF COMPLIANT: Follow-up of right breast cancer on anastrozole   INTERVAL HISTORY: Elizabeth Cherry is a 65 y.o. with above-mentioned history of right breast cancer treated with lumpectomy, radiation, and who is currently on anastrozole therapy. She presents to the clinic today for annual follow-up.   ALLERGIES:  is allergic to bactrim [sulfamethoxazole-trimethoprim], diazepam, fluoxetine, telmisartan, tetracycline, tetracyclines & related, aspirin, cefdinir, clarithromycin, nsaids, risperidone, trazodone, atorvastatin, praluent [alirocumab], pravastatin, repatha [evolocumab], rosuvastatin, and oxycodone.  MEDICATIONS:  Current Outpatient Medications  Medication Sig Dispense Refill   acetaminophen (TYLENOL) 500 MG  tablet Take 1,000 mg by mouth every 6 (six) hours as needed.     albuterol (VENTOLIN HFA) 108 (90 Base) MCG/ACT inhaler Inhale 1-2 puffs into the lungs every 6 (six) hours as needed for wheezing or shortness of breath. 18 g 2   alendronate (FOSAMAX) 70 MG tablet Take 1 tablet (70 mg total) by mouth once a week. full glass of water on an empty stomach. 12 tablet 3   anastrozole (ARIMIDEX) 1 MG tablet TAKE (1) TABLET BY MOUTH ONCE DAILY. 90 tablet 3   BREO ELLIPTA 200-25 MCG/INH AEPB Inhale 1 puff into the lungs daily as needed. 60 each 2   budesonide-formoterol (SYMBICORT) 160-4.5 MCG/ACT inhaler Inhale 2 puffs into the lungs daily as needed. 10.2 g 2   chlorthalidone (HYGROTON) 25 MG tablet Take 1 tablet (25 mg total) by mouth daily. 90 tablet 1   clonazePAM (KLONOPIN) 0.5 MG tablet Take 1 tablet (0.5 mg total) by mouth 3 (three) times daily as needed (anxiety or sleep). 90 tablet 0   dicyclomine (BENTYL) 20 MG tablet Take 20 mg by mouth every 6 (six) hours.     hydrochlorothiazide (HYDRODIURIL) 50 MG tablet TAKE 1 TABLET DAILY 30 tablet 0   hydrOXYzine (VISTARIL) 50 MG capsule Take 2 capsules (100 mg total) by mouth at bedtime. 90 capsule 1   ketoconazole (NIZORAL) 2 % shampoo Apply 1 application topically 2 (two) times a week. 120 mL 2   montelukast (SINGULAIR) 10 MG tablet Take 1 tablet (10 mg total) by mouth daily. 90 tablet 1   pantoprazole (PROTONIX) 40 MG tablet Take 1 tablet (40 mg total) by mouth daily. 90 tablet 1   potassium chloride SA (KLOR-CON) 20 MEQ tablet Take 1 tablet (20 mEq total) by mouth daily. 90 tablet 1   Probiotic Product (  SOLUBLE FIBER/PROBIOTICS PO) Take by mouth.     QUEtiapine (SEROQUEL) 100 MG tablet Take 100 mg by mouth at bedtime. 150-225m     sertraline (ZOLOFT) 100 MG tablet Take 150 mg by mouth daily.     tiotropium (SPIRIVA HANDIHALER) 18 MCG inhalation capsule Place 1 capsule (18 mcg total) into inhaler and inhale daily. 90 capsule 2   traMADol (ULTRAM) 50 MG  tablet Take 1 tablet (50 mg total) by mouth 2 (two) times daily. 60 tablet 1   No current facility-administered medications for this visit.    PHYSICAL EXAMINATION: ECOG PERFORMANCE STATUS: {CHL ONC ECOG PS:[phone]}  There were no vitals filed for this visit. There were no vitals filed for this visit.  BREAST:*** No palpable masses or nodules in either right or left breasts. No palpable axillary supraclavicular or infraclavicular adenopathy no breast tenderness or nipple discharge. (exam performed in the presence of a chaperone)  LABORATORY DATA:  I have reviewed the data as listed CMP Latest Ref Rng & Units 02/24/2020 03/29/2019 12/08/2018  Glucose 65 - 99 mg/dL 89 - 104(H)  BUN 7 - 25 mg/dL 12 - 9  Creatinine 0.50 - 0.99 mg/dL 0.88 - 1.07(H)  Sodium 135 - 146 mmol/L 136 - 133(L)  Potassium 3.5 - 5.3 mmol/L 3.8 - 3.3(L)  Chloride 98 - 110 mmol/L 96(L) - 89(L)  CO2 20 - 32 mmol/L 29 - 27  Calcium 8.6 - 10.4 mg/dL 9.6 - 9.6  Total Protein 6.1 - 8.1 g/dL 6.8 6.7 6.5  Total Bilirubin 0.2 - 1.2 mg/dL 0.4 0.4 0.3  Alkaline Phos 39 - 117 IU/L - 143(H) 123(H)  AST 10 - 35 U/L _0 ALT 6 - 29 U/L _1 Lab Results  Component Value Date   WBC 9.4 02/24/2020   HGB 14.1 02/24/2020   HCT 43.2 02/24/2020   MCV 82.9 02/24/2020   PLT 407 (H) 02/24/2020   NEUTROABS 6,975 02/24/2020    ASSESSMENT & PLAN:  No problem-specific Assessment & Plan notes found for this encounter.    No orders of the defined types were placed in this encounter.  The patient has a good understanding of the overall plan. she agrees with it. she will call with any problems that may develop before the next visit here.  Total time spent: *** mins including face to face time and time spent for planning, charting and coordination of care  VRulon Eisenmenger MD, MPH 04/09/2021  I, KThana Ates am acting as scribe for Dr. VNicholas Lose  {insert scribe attestation}

## 2021-04-10 ENCOUNTER — Telehealth: Payer: Self-pay | Admitting: Hematology and Oncology

## 2021-04-10 ENCOUNTER — Inpatient Hospital Stay: Payer: Medicare HMO | Admitting: Hematology and Oncology

## 2021-04-10 NOTE — Assessment & Plan Note (Deleted)
Right lumpectomy 02/14/2016: Invasive lobular cancer, grade 1, 1.8 cm, margins negative but close, 0/1 lymph node negative, T1 CN 0 stage IA, ER 95%, PR 95%, Ki-67 10%, HER-2 negative ratio 1.31 We did not order Oncotype DX testing because the patient is not interested in chemotherapy.  Treatment summary: 1. Adjuvant radiation therapy (at [facility]) completed 05/08/2016 2. Adjuvant antiestrogen therapy with anastrozole 1 mg daily since 05/20/2016  Anastrozole toxicities: Denies any myalgias. Does have occasional hot flashes.  Breast cancer surveillance: 1.Breast exam done9/13/2021: Benign 2. Mammograms10/12/2021at [facility].:  Benign, density category C   Left ankle fracture with severe osteopenia: Her last bone density test at [facility] in [place] on 12/21/2015 revealed a T score of -2.4: On Fosamax with calcium and vitamin D.  I renewed her prescription today.  She takes 7 different supplements  Return to clinic in 1 year for follow-up

## 2021-04-10 NOTE — Telephone Encounter (Signed)
Scheduled per sch msg. Called and spoke with patient. Confirmed appt  

## 2021-05-07 ENCOUNTER — Other Ambulatory Visit: Payer: Self-pay | Admitting: Internal Medicine

## 2021-05-23 NOTE — Progress Notes (Signed)
Patient Care Team: Isaac Bliss, Rayford Halsted, MD as PCP - General (Internal Medicine) Nahser, Wonda Cheng, MD as PCP - Cardiology (Cardiology) Excell Seltzer, MD (Inactive) as Consulting Physician (General Surgery) Nicholas Lose, MD as Consulting Physician (Hematology and Oncology) Kyung Rudd, MD as Consulting Physician (Radiation Oncology) Gardenia Phlegm, NP as Nurse Practitioner (Hematology and Oncology)  DIAGNOSIS:    ICD-10-CM   1. Malignant neoplasm of upper-inner quadrant of right breast in female, estrogen receptor positive ([place])  C50.211    Z17.0       SUMMARY OF ONCOLOGIC HISTORY: Oncology History  Breast cancer of upper-inner quadrant of right female breast ([place])  01/03/2016 Initial Diagnosis   Palpable right breast abnormality 1.6 cm at 2:00 position axilla negative ultrasound biopsy invasive lobular cancer grade 1, ER 95%, PR 95%, Ki-67 10%, HER-2 negative ratio 1.31, T1 cN0 stage IA clinical stage   02/14/2016 Surgery   Right lumpectomy: Invasive lobular cancer, grade 1, 1.8 cm, margins negative but close, 0/1 lymph node negative, T1 CN 0 stage IA, ER 95%, PR 95%, Ki-67 10%, HER-2 negative ratio 1.31   03/20/2016 - 05/08/2016 Radiation Therapy   Adjuvant radiation therapy at [facility]   05/20/2016 -  Anti-estrogen oral therapy   Anastrozole 1 mg by mouth daily     CHIEF COMPLIANT: Follow-up of right breast cancer on anastrozole   INTERVAL HISTORY: Elizabeth Cherry is a 66 y.o. with above-mentioned history of right breast cancer treated with lumpectomy, radiation, and who is currently on anastrozole therapy. She presents to the clinic today for annual follow-up.   ALLERGIES:  is allergic to bactrim [sulfamethoxazole-trimethoprim], diazepam, fluoxetine, telmisartan, tetracycline, tetracyclines & related, aspirin, cefdinir, clarithromycin, nsaids, risperidone, trazodone, atorvastatin, praluent [alirocumab], pravastatin, repatha [evolocumab], rosuvastatin, and  oxycodone.  MEDICATIONS:  Current Outpatient Medications  Medication Sig Dispense Refill   acetaminophen (TYLENOL) 500 MG tablet Take 1,000 mg by mouth every 6 (six) hours as needed.     albuterol (VENTOLIN HFA) 108 (90 Base) MCG/ACT inhaler Inhale 1-2 puffs into the lungs every 6 (six) hours as needed for wheezing or shortness of breath. 18 g 2   alendronate (FOSAMAX) 70 MG tablet Take 1 tablet (70 mg total) by mouth once a week. full glass of water on an empty stomach. 12 tablet 3   anastrozole (ARIMIDEX) 1 MG tablet TAKE (1) TABLET BY MOUTH ONCE DAILY. 90 tablet 3   BREO ELLIPTA 200-25 MCG/INH AEPB Inhale 1 puff into the lungs daily as needed. 60 each 2   budesonide-formoterol (SYMBICORT) 160-4.5 MCG/ACT inhaler Inhale 2 puffs into the lungs daily as needed. 10.2 g 2   chlorthalidone (HYGROTON) 25 MG tablet Take 1 tablet (25 mg total) by mouth daily. 90 tablet 1   clonazePAM (KLONOPIN) 0.5 MG tablet Take 1 tablet (0.5 mg total) by mouth 3 (three) times daily as needed (anxiety or sleep). 90 tablet 0   dicyclomine (BENTYL) 20 MG tablet Take 20 mg by mouth every 6 (six) hours.     hydrochlorothiazide (HYDRODIURIL) 50 MG tablet TAKE 1 TABLET DAILY 30 tablet 0   hydrOXYzine (VISTARIL) 50 MG capsule Take 2 capsules (100 mg total) by mouth at bedtime. 90 capsule 1   ketoconazole (NIZORAL) 2 % shampoo Apply 1 application topically 2 (two) times a week. 120 mL 2   montelukast (SINGULAIR) 10 MG tablet Take 1 tablet (10 mg total) by mouth daily. 90 tablet 1   pantoprazole (PROTONIX) 40 MG tablet Take 1 tablet (40 mg total) by mouth  daily. 90 tablet 1   potassium chloride SA (KLOR-CON) 20 MEQ tablet Take 1 tablet (20 mEq total) by mouth daily. 90 tablet 1   Probiotic Product (SOLUBLE FIBER/PROBIOTICS PO) Take by mouth.     QUEtiapine (SEROQUEL) 100 MG tablet Take 100 mg by mouth at bedtime. 150-239m     sertraline (ZOLOFT) 100 MG tablet Take 150 mg by mouth daily.     tiotropium (SPIRIVA HANDIHALER)  18 MCG inhalation capsule Place 1 capsule (18 mcg total) into inhaler and inhale daily. 90 capsule 2   traMADol (ULTRAM) 50 MG tablet Take 1 tablet (50 mg total) by mouth 2 (two) times daily. 60 tablet 1   No current facility-administered medications for this visit.    PHYSICAL EXAMINATION: ECOG PERFORMANCE STATUS: 1 - Symptomatic but completely ambulatory  Vitals:   05/24/21 1429  BP: 120/78  Pulse: (!) 101  Resp: 18  Temp: 97.6 F (36.4 C)  SpO2: 95%   Filed Weights   05/24/21 1429  Weight: 149 lb 3.2 oz (67.7 kg)      LABORATORY DATA:  I have reviewed the data as listed CMP Latest Ref Rng & Units 02/24/2020 03/29/2019 12/08/2018  Glucose 65 - 99 mg/dL 89 - 104(H)  BUN 7 - 25 mg/dL 12 - 9  Creatinine 0.50 - 0.99 mg/dL 0.88 - 1.07(H)  Sodium 135 - 146 mmol/L 136 - 133(L)  Potassium 3.5 - 5.3 mmol/L 3.8 - 3.3(L)  Chloride 98 - 110 mmol/L 96(L) - 89(L)  CO2 20 - 32 mmol/L 29 - 27  Calcium 8.6 - 10.4 mg/dL 9.6 - 9.6  Total Protein 6.1 - 8.1 g/dL 6.8 6.7 6.5  Total Bilirubin 0.2 - 1.2 mg/dL 0.4 0.4 0.3  Alkaline Phos 39 - 117 IU/L - 143(H) 123(H)  AST 10 - 35 U/L _0 ALT 6 - 29 U/L _1 Lab Results  Component Value Date   WBC 9.4 02/24/2020   HGB 14.1 02/24/2020   HCT 43.2 02/24/2020   MCV 82.9 02/24/2020   PLT 407 (H) 02/24/2020   NEUTROABS 6,975 02/24/2020    ASSESSMENT & PLAN:  [place]ast cancer of upper-inner quadrant of right [place] breast [place] 02/[place]/2016: Invasive lobular cancer, grade 1, 1.8 cm, margins negative but close, 0/1 lymph node negative, T1 CN 0 stage IA, ER 95%, PR 95%, Ki-67 10%, HER-2 negative ratio 1.31 We did not order Oncotype DX testing because the patient is not interested in chemotherapy.    Treatment summary: 1. [facility] 2. Adjuvant antiestrogen therapy with anastrozole 1 mg daily since 05/20/2016   Anastrozole toxicities: Denies any myalgias. Does have  occasional hot flashes.   Breast cancer surveillance: 1. Breast exam done 05/24/2021: Benign 2. [facility]  Benign breast density category C    Left ankle fracture with severe osteopenia :  [facility] revealed a T score of -2.4: On Fosamax with calcium and vitamin D.  I renewed her prescription today.   She takes 7 different supplements   Return to clinic in 1 year for follow-up      No orders of the defined types were placed in this encounter.  The patient has a good understanding of the overall plan. she agrees with it. she will call with any problems that may develop before the next visit here.  Total time spent: 20 mins including face to  face time and time spent for planning, charting and coordination of care  Rulon Eisenmenger, MD, MPH 05/24/2021  I, Thana Ates, am acting as scribe for Dr. Nicholas Lose.  I have reviewed the above documentation for accuracy and completeness, and I agree with the above.

## 2021-05-24 ENCOUNTER — Other Ambulatory Visit: Payer: Self-pay

## 2021-05-24 ENCOUNTER — Inpatient Hospital Stay: Payer: Medicare HMO | Attending: Hematology and Oncology | Admitting: Hematology and Oncology

## 2021-05-24 DIAGNOSIS — Z79899 Other long term (current) drug therapy: Secondary | ICD-10-CM | POA: Insufficient documentation

## 2021-05-24 DIAGNOSIS — C50211 Malignant neoplasm of upper-inner quadrant of right female breast: Secondary | ICD-10-CM | POA: Diagnosis present

## 2021-05-24 DIAGNOSIS — Z923 Personal history of irradiation: Secondary | ICD-10-CM | POA: Insufficient documentation

## 2021-05-24 DIAGNOSIS — Z170 Estrogen receptor positive status [ER+]: Secondary | ICD-10-CM | POA: Insufficient documentation

## 2021-05-24 DIAGNOSIS — Z79811 Long term (current) use of aromatase inhibitors: Secondary | ICD-10-CM | POA: Insufficient documentation

## 2021-05-24 DIAGNOSIS — M8580 Other specified disorders of bone density and structure, unspecified site: Secondary | ICD-10-CM | POA: Diagnosis not present

## 2021-05-24 NOTE — Assessment & Plan Note (Signed)
Right lumpectomy 02/14/2016: Invasive lobular cancer, grade 1, 1.8 cm, margins negative but close, 0/1 lymph node negative, T1 CN 0 stage IA, ER 95%, PR 95%, Ki-67 10%, HER-2 negative ratio 1.31 We did not order Oncotype DX testing because the patient is not interested in chemotherapy.  Treatment summary: 1. Adjuvant radiation therapy (at [facility]) completed 05/08/2016 2. Adjuvant antiestrogen therapy with anastrozole 1 mg daily since 05/20/2016  Anastrozole toxicities: Denies any myalgias. Does have occasional hot flashes.  Breast cancer surveillance: 1.Breast exam done1/09/2021: Benign 2. Mammograms11/10/2022at [facility].:  Benign breast density category C   Left ankle fracture with severe osteopenia: Her last bone density test at [facility] in [place] on 12/21/2015 revealed a T score of -2.4: On Fosamax with calcium and vitamin D.  I renewed her prescription today.  She takes 7 different supplements  Return to clinic in 1 year for follow-up

## 2021-07-28 ENCOUNTER — Other Ambulatory Visit: Payer: Self-pay | Admitting: Internal Medicine

## 2021-10-26 ENCOUNTER — Ambulatory Visit: Payer: Medicare HMO | Admitting: Cardiovascular Disease

## 2021-11-21 ENCOUNTER — Other Ambulatory Visit: Payer: Self-pay | Admitting: Internal Medicine

## 2021-12-24 ENCOUNTER — Telehealth: Payer: Self-pay | Admitting: Gastroenterology

## 2021-12-24 NOTE — Telephone Encounter (Signed)
Hi Dr. Loletha Carrow,  (Supervising DoD for 12/24/21 p.m.)  We received this patient's records and she is requesting a transfer of care to our facility.  I will forward her records to you for review.  Please let me know if you accept the transfer.  Thank you.

## 2021-12-25 NOTE — Telephone Encounter (Signed)
Given our backlog of referrals for patients without established GI physicians in this area (such as the [place] practices she saw in 2019 and 2020), what is the reason for consult (i.e.. history polyps, abdominal pain, etc) and the reason for transfer request?  - HD

## 2021-12-25 NOTE — Telephone Encounter (Signed)
PT is returning call. She has advised me that the reason for the consult is for severe abdominal pain and ulcerated stomach. She is requesting transfer because her Dr.at [facility] left and the new doctor is not sufficient. She also couldn't get an appointment at [facility].

## 2021-12-26 NOTE — Telephone Encounter (Signed)
Request received to transfer GI care from outside practice to [place].  We appreciate the interest in our practice, however at this time due to high demand from patients without established GI providers, we cannot accommodate this transfer.   Patient has seen two GI practices in this area for this problem within the last several years.  I will therefore politely decline the transfer request.  - H. Loletha Carrow, MD

## 2022-05-15 ENCOUNTER — Telehealth: Payer: Self-pay | Admitting: Hematology and Oncology

## 2022-05-15 NOTE — Telephone Encounter (Signed)
Rescheduled appointment per provider on-call. Patient is aware of the changes made to her upcoming appointment. 

## 2022-05-27 ENCOUNTER — Ambulatory Visit: Payer: Medicare HMO | Admitting: Hematology and Oncology

## 2022-05-28 ENCOUNTER — Other Ambulatory Visit: Payer: Self-pay | Admitting: Internal Medicine

## 2022-06-06 ENCOUNTER — Inpatient Hospital Stay: Payer: Medicare HMO | Admitting: Hematology and Oncology

## 2022-06-17 ENCOUNTER — Ambulatory Visit: Payer: Medicare HMO | Admitting: Hematology and Oncology

## 2022-06-24 ENCOUNTER — Other Ambulatory Visit: Payer: Self-pay | Admitting: *Deleted

## 2022-06-24 ENCOUNTER — Inpatient Hospital Stay: Payer: Medicare HMO | Admitting: Hematology and Oncology

## 2022-06-24 DIAGNOSIS — Z170 Estrogen receptor positive status [ER+]: Secondary | ICD-10-CM

## 2022-06-24 NOTE — Progress Notes (Signed)
Received call from pt with complaint of ongoing right breast pain and inflammation x several months.  Pt states she has not had a mammogram in quite some time and would like to be set up for one prior to MD visit.  RN reviewed with MD and verbal orders received to obtain diagnostic mammogram as well as right breast US.  Orders faxed to [facility].  Pt states she will call [facility] to scheduled.

## 2022-06-24 NOTE — Assessment & Plan Note (Deleted)
Right lumpectomy 02/14/2016: Invasive lobular cancer, grade 1, 1.8 cm, margins negative but close, 0/1 lymph node negative, T1 CN 0 stage IA, ER 95%, PR 95%, Ki-67 10%, HER-2 negative ratio 1.31 We did not order Oncotype DX testing because the patient is not interested in chemotherapy.    Treatment summary: 1. Adjuvant radiation therapy (at [facility]) completed 05/08/2016 2. Adjuvant antiestrogen therapy with anastrozole 1 mg daily since 05/20/2016   Anastrozole toxicities: Denies any myalgias. Does have occasional hot flashes.   Breast cancer surveillance: 1. Breast exam 06/24/22: Benign 2. Mammograms and Bone density 09/03/22 at [facility]  Benign breast density category C    Left ankle fracture with severe osteopenia :  Her last bone density test at [facility] on 12/21/2015 revealed a T score of -2.4: On Fosamax with calcium and vitamin D.  I renewed her prescription today.   She takes 7 different supplements   Return to clinic in 1 year for follow-up

## 2022-07-11 NOTE — Progress Notes (Incomplete)
Patient Care Team: Isaac Bliss, Rayford Halsted, MD as PCP - General (Internal Medicine) Nahser, Wonda Cheng, MD as PCP - Cardiology (Cardiology) Excell Seltzer, MD (Inactive) as Consulting Physician (General Surgery) Nicholas Lose, MD as Consulting Physician (Hematology and Oncology) Kyung Rudd, MD as Consulting Physician (Radiation Oncology) Delice Bison Charlestine Massed, NP as Nurse Practitioner (Hematology and Oncology)  DIAGNOSIS: No diagnosis found.  SUMMARY OF ONCOLOGIC HISTORY: Oncology History  Breast cancer of upper-inner quadrant of right female breast ([place])  01/03/2016 Initial Diagnosis   Palpable right breast abnormality 1.6 cm at 2:00 position axilla negative ultrasound biopsy invasive lobular cancer grade 1, ER 95%, PR 95%, Ki-67 10%, HER-2 negative ratio 1.31, T1 cN0 stage IA clinical stage   02/14/2016 Surgery   Right lumpectomy: Invasive lobular cancer, grade 1, 1.8 cm, margins negative but close, 0/1 lymph node negative, T1 CN 0 stage IA, ER 95%, PR 95%, Ki-67 10%, HER-2 negative ratio 1.31   03/20/2016 - 05/08/2016 Radiation Therapy   Adjuvant radiation [facility] at [facility]   05/20/2016 -  Anti-estrogen oral therapy   Anastrozole 1 mg by mouth daily     CHIEF COMPLIANT:   INTERVAL HISTORY: Elizabeth Cherry is a   ALLERGIES:  is allergic to bactrim [sulfamethoxazole-trimethoprim], diazepam, fluoxetine, telmisartan, tetracycline, tetracyclines & related, aspirin, cefdinir, clarithromycin, nsaids, risperidone, trazodone, atorvastatin, praluent [alirocumab], pravastatin, repatha [evolocumab], rosuvastatin, and oxycodone.  MEDICATIONS:  Current Outpatient Medications  Medication Sig Dispense Refill   acetaminophen (TYLENOL) 500 MG tablet Take 1,000 mg by mouth every 6 (six) hours as needed.     albuterol (VENTOLIN HFA) 108 (90 Base) MCG/ACT inhaler Inhale 1-2 puffs into the lungs every 6 (six) hours as needed for wheezing or shortness of breath. 18 g 2   alendronate  (FOSAMAX) 70 MG tablet Take 1 tablet (70 mg total) by mouth once a week. full glass of water on an empty stomach. 12 tablet 3   anastrozole (ARIMIDEX) 1 MG tablet TAKE (1) TABLET BY MOUTH ONCE DAILY. (Patient not taking: Reported on 05/24/2021) 90 tablet 3   ASHWAGANDHA PO Take by mouth.     b complex vitamins capsule Take 1 capsule by mouth daily.     BREO ELLIPTA 200-25 MCG/INH AEPB Inhale 1 puff into the lungs daily as needed. 60 each 2   budesonide-formoterol (SYMBICORT) 160-4.5 MCG/ACT inhaler Inhale 2 puffs into the lungs daily as needed. 10.2 g 2   calcium carbonate (OS-CAL) 1250 (500 Ca) MG chewable tablet Chew 1 tablet by mouth daily.     chlorthalidone (HYGROTON) 25 MG tablet Take 1 tablet (25 mg total) by mouth daily. 90 tablet 1   clonazePAM (KLONOPIN) 0.5 MG tablet Take 1 tablet (0.5 mg total) by mouth 3 (three) times daily as needed (anxiety or sleep). 90 tablet 0   dicyclomine (BENTYL) 20 MG tablet Take 20 mg by mouth every 6 (six) hours.     fish oil-omega-3 fatty acids 1000 MG capsule Take 2 g by mouth daily.     hydrochlorothiazide (HYDRODIURIL) 50 MG tablet TAKE 1 TABLET DAILY 90 tablet 0   hydrOXYzine (VISTARIL) 50 MG capsule Take 2 capsules (100 mg total) by mouth at bedtime. (Patient taking differently: Take 200 mg by mouth at bedtime.) 90 capsule 1   ketoconazole (NIZORAL) 2 % shampoo Apply 1 application topically 2 (two) times a week. 120 mL 2   montelukast (SINGULAIR) 10 MG tablet Take 1 tablet (10 mg total) by mouth daily. 90 tablet 1   pantoprazole (PROTONIX) 40  MG tablet Take 1 tablet (40 mg total) by mouth daily. 90 tablet 1   Probiotic Product (SOLUBLE FIBER/PROBIOTICS PO) Take by mouth.     QUEtiapine (SEROQUEL) 100 MG tablet Take 100 mg by mouth at bedtime. 150-282m     sertraline (ZOLOFT) 100 MG tablet Take 100 mg by mouth daily.     tiotropium (SPIRIVA HANDIHALER) 18 MCG inhalation capsule Place 1 capsule (18 mcg total) into inhaler and inhale daily. 90 capsule 2    traMADol (ULTRAM) 50 MG tablet Take 1 tablet (50 mg total) by mouth 2 (two) times daily. 60 tablet 1   No current facility-administered medications for this visit.    PHYSICAL EXAMINATION: ECOG PERFORMANCE STATUS: {CHL ONC ECOG PS:[id number]}  There were no vitals filed for this visit. There were no vitals filed for this visit.  BREAST:*** No palpable masses or nodules in either right or left breasts. No palpable axillary supraclavicular or infraclavicular adenopathy no breast tenderness or nipple discharge. (exam performed in the presence of a chaperone)  LABORATORY DATA:  I have reviewed the data as listed    Latest Ref Rng & Units 02/24/2020   10:27 AM 03/29/2019   11:57 AM 12/08/2018   10:48 AM  CMP  Glucose 65 - 99 mg/dL 89   104   BUN 7 - 25 mg/dL 12   9   Creatinine 0.50 - 0.99 mg/dL 0.88   1.07   Sodium 135 - 146 mmol/L 136   133   Potassium 3.5 - 5.3 mmol/L 3.8   3.3   Chloride 98 - 110 mmol/L 96   89   CO2 20 - 32 mmol/L 29   27   Calcium 8.6 - 10.4 mg/dL 9.6   9.6   Total Protein 6.1 - 8.1 g/dL 6.8  6.7  6.5   Total Bilirubin 0.2 - 1.2 mg/dL 0.4  0.4  0.3   Alkaline Phos 39 - 117 IU/L  143  123   AST 10 - 35 U/L 16  27  22   $ ALT 6 - 29 U/L 16  29  19     $ Lab Results  Component Value Date   WBC 9.4 02/24/2020   HGB 14.1 02/24/2020   HCT 43.2 02/24/2020   MCV 82.9 02/24/2020   PLT 407 (H) 02/24/2020   NEUTROABS 6,975 02/24/2020    ASSESSMENT & PLAN:  No problem-specific Assessment & Plan notes found for this encounter.    No orders of the defined types were placed in this encounter.  The patient has a good understanding of the overall plan. she agrees with it. she will call with any problems that may develop before the next visit here. Total time spent: 30 mins including face to face time and time spent for planning, charting and co-ordination of care   DSuzzette Righter CP[place]02/22/24    I DGardiner Coinsam acting as a sEducation administratorfor [employer] ***

## 2022-07-15 ENCOUNTER — Telehealth: Payer: Self-pay

## 2022-07-15 NOTE — Telephone Encounter (Signed)
Pt called and states she would like to cancel appt with Dr Lindi Adie for tomorrow because she did not go to have MM. Pt states she has "been having family issues and I just need rest." Pt was educated on importance of MM especially given her hx. Pt states, "I have sore breasts in general. The thought of an MRI mashing my tits is unbearable. I will pass out and hang from the machine by my tits."  Pt goes on to say she will have MM scheduled for next month and will call us to r/s her f.u with Dr Lindi Adie.

## 2022-07-16 ENCOUNTER — Inpatient Hospital Stay: Payer: Medicare HMO | Admitting: Hematology and Oncology

## 2023-08-07 ENCOUNTER — Inpatient Hospital Stay (HOSPITAL_COMMUNITY)
Admission: EM | Admit: 2023-08-07 | Discharge: 2023-08-20 | DRG: 871 | Disposition: A | Discharge status: Hospice | Attending: Family Medicine | Admitting: Family Medicine

## 2023-08-07 ENCOUNTER — Emergency Department (HOSPITAL_COMMUNITY)

## 2023-08-07 ENCOUNTER — Other Ambulatory Visit: Payer: Self-pay

## 2023-08-07 ENCOUNTER — Encounter (HOSPITAL_COMMUNITY): Payer: Self-pay | Admitting: Emergency Medicine

## 2023-08-07 DIAGNOSIS — G9341 Metabolic encephalopathy: Secondary | ICD-10-CM | POA: Diagnosis present

## 2023-08-07 DIAGNOSIS — Z7189 Other specified counseling: Secondary | ICD-10-CM | POA: Diagnosis not present

## 2023-08-07 DIAGNOSIS — F411 Generalized anxiety disorder: Secondary | ICD-10-CM | POA: Diagnosis present

## 2023-08-07 DIAGNOSIS — Z882 Allergy status to sulfonamides status: Secondary | ICD-10-CM

## 2023-08-07 DIAGNOSIS — J9622 Acute and chronic respiratory failure with hypercapnia: Secondary | ICD-10-CM | POA: Diagnosis present

## 2023-08-07 DIAGNOSIS — Z7983 Long term (current) use of bisphosphonates: Secondary | ICD-10-CM | POA: Diagnosis not present

## 2023-08-07 DIAGNOSIS — Z515 Encounter for palliative care: Secondary | ICD-10-CM | POA: Diagnosis not present

## 2023-08-07 DIAGNOSIS — I10 Essential (primary) hypertension: Secondary | ICD-10-CM | POA: Diagnosis present

## 2023-08-07 DIAGNOSIS — Z66 Do not resuscitate: Secondary | ICD-10-CM | POA: Diagnosis not present

## 2023-08-07 DIAGNOSIS — Z91199 Patient's noncompliance with other medical treatment and regimen due to unspecified reason: Secondary | ICD-10-CM

## 2023-08-07 DIAGNOSIS — Z79899 Other long term (current) drug therapy: Secondary | ICD-10-CM | POA: Diagnosis not present

## 2023-08-07 DIAGNOSIS — Z923 Personal history of irradiation: Secondary | ICD-10-CM

## 2023-08-07 DIAGNOSIS — K219 Gastro-esophageal reflux disease without esophagitis: Secondary | ICD-10-CM | POA: Diagnosis present

## 2023-08-07 DIAGNOSIS — Z853 Personal history of malignant neoplasm of breast: Secondary | ICD-10-CM

## 2023-08-07 DIAGNOSIS — Z7951 Long term (current) use of inhaled steroids: Secondary | ICD-10-CM

## 2023-08-07 DIAGNOSIS — Z881 Allergy status to other antibiotic agents status: Secondary | ICD-10-CM | POA: Diagnosis not present

## 2023-08-07 DIAGNOSIS — R627 Adult failure to thrive: Secondary | ICD-10-CM | POA: Diagnosis present

## 2023-08-07 DIAGNOSIS — K5900 Constipation, unspecified: Secondary | ICD-10-CM | POA: Diagnosis not present

## 2023-08-07 DIAGNOSIS — F1021 Alcohol dependence, in remission: Secondary | ICD-10-CM | POA: Diagnosis present

## 2023-08-07 DIAGNOSIS — Z602 Problems related to living alone: Secondary | ICD-10-CM | POA: Diagnosis present

## 2023-08-07 DIAGNOSIS — D509 Iron deficiency anemia, unspecified: Secondary | ICD-10-CM | POA: Diagnosis not present

## 2023-08-07 DIAGNOSIS — A419 Sepsis, unspecified organism: Principal | ICD-10-CM | POA: Diagnosis present

## 2023-08-07 DIAGNOSIS — F17210 Nicotine dependence, cigarettes, uncomplicated: Secondary | ICD-10-CM | POA: Diagnosis present

## 2023-08-07 DIAGNOSIS — E782 Mixed hyperlipidemia: Secondary | ICD-10-CM | POA: Diagnosis present

## 2023-08-07 DIAGNOSIS — J9601 Acute respiratory failure with hypoxia: Secondary | ICD-10-CM | POA: Diagnosis not present

## 2023-08-07 DIAGNOSIS — J441 Chronic obstructive pulmonary disease with (acute) exacerbation: Secondary | ICD-10-CM | POA: Diagnosis present

## 2023-08-07 DIAGNOSIS — E876 Hypokalemia: Secondary | ICD-10-CM | POA: Diagnosis not present

## 2023-08-07 DIAGNOSIS — R0603 Acute respiratory distress: Secondary | ICD-10-CM | POA: Diagnosis present

## 2023-08-07 DIAGNOSIS — E538 Deficiency of other specified B group vitamins: Secondary | ICD-10-CM | POA: Diagnosis present

## 2023-08-07 DIAGNOSIS — J9621 Acute and chronic respiratory failure with hypoxia: Secondary | ICD-10-CM | POA: Diagnosis present

## 2023-08-07 DIAGNOSIS — E871 Hypo-osmolality and hyponatremia: Secondary | ICD-10-CM | POA: Diagnosis present

## 2023-08-07 DIAGNOSIS — M797 Fibromyalgia: Secondary | ICD-10-CM | POA: Diagnosis present

## 2023-08-07 LAB — CBC WITH DIFFERENTIAL/PLATELET
Abs Immature Granulocytes: 0 10*3/uL (ref 0.00–0.07)
Band Neutrophils: 1 %
Basophils Absolute: 0 10*3/uL (ref 0.0–0.1)
Basophils Relative: 0 %
Eosinophils Absolute: 0 10*3/uL (ref 0.0–0.5)
Eosinophils Relative: 0 %
HCT: 42 % (ref 36.0–46.0)
Hemoglobin: 13 g/dL (ref 12.0–15.0)
Lymphocytes Relative: 4 %
Lymphs Abs: 0.8 10*3/uL (ref 0.7–4.0)
MCH: 24.3 pg — ABNORMAL LOW (ref 26.0–34.0)
MCHC: 31 g/dL (ref 30.0–36.0)
MCV: 78.5 fL — ABNORMAL LOW (ref 80.0–100.0)
Monocytes Absolute: 0.6 10*3/uL (ref 0.1–1.0)
Monocytes Relative: 3 %
Neutro Abs: 18.2 10*3/uL — ABNORMAL HIGH (ref 1.7–7.7)
Neutrophils Relative %: 92 %
Platelets: 455 10*3/uL — ABNORMAL HIGH (ref 150–400)
RBC: 5.35 MIL/uL — ABNORMAL HIGH (ref 3.87–5.11)
RDW: 16.5 % — ABNORMAL HIGH (ref 11.5–15.5)
Smear Review: INCREASED
WBC: 19.6 10*3/uL — ABNORMAL HIGH (ref 4.0–10.5)
nRBC: 0 % (ref 0.0–0.2)

## 2023-08-07 LAB — BLOOD GAS, VENOUS
Acid-Base Excess: 14.5 mmol/L — ABNORMAL HIGH (ref 0.0–2.0)
Bicarbonate: 45.7 mmol/L — ABNORMAL HIGH (ref 20.0–28.0)
Drawn by: 4237
O2 Saturation: 60.2 %
Patient temperature: 37.1
pCO2, Ven: 95 mmHg (ref 44–60)
pH, Ven: 7.29 (ref 7.25–7.43)
pO2, Ven: 32 mmHg (ref 32–45)

## 2023-08-07 LAB — BLOOD GAS, ARTERIAL
Acid-Base Excess: 17.2 mmol/L — ABNORMAL HIGH (ref 0.0–2.0)
Bicarbonate: 44.5 mmol/L — ABNORMAL HIGH (ref 20.0–28.0)
FIO2: 40 %
O2 Saturation: 98.3 %
Patient temperature: 36.8
pCO2 arterial: 63 mmHg — ABNORMAL HIGH (ref 32–48)
pH, Arterial: 7.45 (ref 7.35–7.45)
pO2, Arterial: 87 mmHg (ref 83–108)

## 2023-08-07 LAB — BASIC METABOLIC PANEL
Anion gap: 14 (ref 5–15)
BUN: 18 mg/dL (ref 8–23)
CO2: 36 mmol/L — ABNORMAL HIGH (ref 22–32)
Calcium: 9 mg/dL (ref 8.9–10.3)
Chloride: 79 mmol/L — ABNORMAL LOW (ref 98–111)
Creatinine, Ser: 0.62 mg/dL (ref 0.44–1.00)
GFR, Estimated: >60 mL/min (ref >60)
Glucose, Bld: 123 mg/dL — ABNORMAL HIGH (ref 70–99)
Potassium: 4.3 mmol/L (ref 3.5–5.1)
Sodium: 129 mmol/L — ABNORMAL LOW (ref 135–145)

## 2023-08-07 LAB — RESP PANEL BY RT-PCR (RSV, FLU A&B, COVID)  RVPGX2
Influenza A by PCR: NEGATIVE
Influenza B by PCR: NEGATIVE
Resp Syncytial Virus by PCR: NEGATIVE
SARS Coronavirus 2 by RT PCR: NEGATIVE

## 2023-08-07 LAB — LACTIC ACID, PLASMA
Lactic Acid, Venous: 1.5 mmol/L (ref 0.5–1.9)
Lactic Acid, Venous: 1.1 mmol/L (ref 0.5–1.9)

## 2023-08-07 LAB — TROPONIN I (HIGH SENSITIVITY)
Troponin I (High Sensitivity): 9 ng/L (ref <18)
Troponin I (High Sensitivity): 9 ng/L (ref <18)

## 2023-08-07 LAB — BRAIN NATRIURETIC PEPTIDE: B Natriuretic Peptide: 562 pg/mL — ABNORMAL HIGH (ref 0.0–100.0)

## 2023-08-07 MED ORDER — ACETAMINOPHEN 325 MG PO TABS
650.0000 mg | ORAL_TABLET | Freq: Four times a day (QID) | ORAL | Status: DC | PRN
  Administered 2023-08-08 – 2023-08-12 (×2): 650 mg via ORAL
  Filled 2023-08-07 (×2): qty 2

## 2023-08-07 MED ORDER — HYDROCODONE-ACETAMINOPHEN 5-325 MG PO TABS
1.0000 | ORAL_TABLET | ORAL | Status: DC | PRN
  Administered 2023-08-08 – 2023-08-20 (×13): 1 via ORAL
  Filled 2023-08-07 (×3): qty 1
  Filled 2023-08-07: qty 2
  Filled 2023-08-07 (×10): qty 1

## 2023-08-07 MED ORDER — ALBUTEROL SULFATE (2.5 MG/3ML) 0.083% IN NEBU: 10.0000 mg/h | INHALATION_SOLUTION | Freq: Once | RESPIRATORY_TRACT | Status: AC

## 2023-08-07 MED ORDER — SODIUM CHLORIDE 0.9% FLUSH
3.0000 mL | Freq: Two times a day (BID) | INTRAVENOUS | Status: DC
  Administered 2023-08-07 – 2023-08-20 (×25): 3 mL via INTRAVENOUS

## 2023-08-07 MED ORDER — ENOXAPARIN SODIUM 40 MG/0.4ML IJ SOSY
40.0000 mg | PREFILLED_SYRINGE | INTRAMUSCULAR | Status: DC
  Administered 2023-08-07 – 2023-08-19 (×12): 40 mg via SUBCUTANEOUS
  Filled 2023-08-07 (×13): qty 0.4

## 2023-08-07 MED ORDER — HYDROXYZINE PAMOATE 50 MG PO CAPS: 200.0000 mg | ORAL_CAPSULE | Freq: Every day | ORAL | Status: DC

## 2023-08-07 MED ORDER — SERTRALINE HCL 50 MG PO TABS
100.0000 mg | ORAL_TABLET | Freq: Every day | ORAL | Status: DC
  Administered 2023-08-08 – 2023-08-20 (×13): 100 mg via ORAL
  Filled 2023-08-07 (×13): qty 2

## 2023-08-07 MED ORDER — MONTELUKAST SODIUM 10 MG PO TABS
10.0000 mg | ORAL_TABLET | Freq: Every day | ORAL | Status: DC
  Administered 2023-08-07 – 2023-08-19 (×12): 10 mg via ORAL
  Filled 2023-08-07 (×13): qty 1

## 2023-08-07 MED ORDER — MORPHINE SULFATE (PF) 2 MG/ML IV SOLN
2.0000 mg | INTRAVENOUS | Status: DC | PRN
  Administered 2023-08-19 (×2): 2 mg via INTRAVENOUS
  Filled 2023-08-07 (×2): qty 1

## 2023-08-07 MED ORDER — HYDRALAZINE HCL 20 MG/ML IJ SOLN
10.0000 mg | Freq: Four times a day (QID) | INTRAMUSCULAR | Status: DC | PRN
  Administered 2023-08-07: 10 mg via INTRAVENOUS
  Filled 2023-08-07: qty 1

## 2023-08-07 MED ORDER — ALBUTEROL SULFATE (2.5 MG/3ML) 0.083% IN NEBU
INHALATION_SOLUTION | RESPIRATORY_TRACT | Status: AC
Start: 2023-08-07 — End: 2023-08-07
  Administered 2023-08-07: 10 mg/h via RESPIRATORY_TRACT
  Filled 2023-08-07: qty 12

## 2023-08-07 MED ORDER — ACETAMINOPHEN 650 MG RE SUPP: 650.0000 mg | Freq: Four times a day (QID) | RECTAL | Status: DC | PRN

## 2023-08-07 MED ORDER — METHYLPREDNISOLONE SODIUM SUCC 40 MG IJ SOLR
40.0000 mg | Freq: Two times a day (BID) | INTRAMUSCULAR | Status: AC
  Administered 2023-08-07 – 2023-08-10 (×6): 40 mg via INTRAVENOUS
  Filled 2023-08-07 (×6): qty 1

## 2023-08-07 MED ORDER — IPRATROPIUM-ALBUTEROL 0.5-2.5 (3) MG/3ML IN SOLN
3.0000 mL | Freq: Four times a day (QID) | RESPIRATORY_TRACT | Status: DC
  Administered 2023-08-07 – 2023-08-10 (×9): 3 mL via RESPIRATORY_TRACT
  Filled 2023-08-07 (×10): qty 3

## 2023-08-07 MED ORDER — CLONAZEPAM 0.5 MG PO TABS
0.5000 mg | ORAL_TABLET | Freq: Three times a day (TID) | ORAL | Status: DC | PRN
  Administered 2023-08-13 – 2023-08-17 (×6): 0.5 mg via ORAL
  Filled 2023-08-07 (×6): qty 1

## 2023-08-07 MED ORDER — LEVOFLOXACIN IN D5W 750 MG/150ML IV SOLN
750.0000 mg | INTRAVENOUS | Status: DC
  Administered 2023-08-07 – 2023-08-09 (×3): 750 mg via INTRAVENOUS
  Filled 2023-08-07 (×3): qty 150

## 2023-08-07 MED ORDER — QUETIAPINE FUMARATE 100 MG PO TABS
100.0000 mg | ORAL_TABLET | Freq: Every day | ORAL | Status: DC
  Administered 2023-08-07 – 2023-08-19 (×12): 100 mg via ORAL
  Filled 2023-08-07 (×13): qty 1

## 2023-08-07 MED ORDER — PANTOPRAZOLE SODIUM 40 MG PO TBEC
40.0000 mg | DELAYED_RELEASE_TABLET | Freq: Every day | ORAL | Status: DC
  Administered 2023-08-08 – 2023-08-20 (×13): 40 mg via ORAL
  Filled 2023-08-07 (×13): qty 1

## 2023-08-07 MED ORDER — NICOTINE 21 MG/24HR TD PT24
21.0000 mg | MEDICATED_PATCH | Freq: Every day | TRANSDERMAL | Status: DC | PRN
  Filled 2023-08-07: qty 1

## 2023-08-07 NOTE — ED Provider Notes (Signed)
 [place] EMERGENCY DEPARTMENT AT [facility], [facility], [facility] Provider Note   CSN: [id number] Arrival date & time: 08/07/23  1121     History  Chief Complaint  Patient presents with   Respiratory Distress    Elizabeth Cherry is a 68 y.o. female.  She is brought in by EMS from home for a weeks worth of shortness of breath cough runny nose.  Reportedly was 70% on room air.  She is very lethargic here, level 5 caveat.  EMS placed her on BiPAP, gave her multiple breathing treatments Solu-Medrol and IV magnesium.  The history is provided by the patient and the EMS personnel. The history is limited by the condition of the patient.  Shortness of Breath Severity:  Severe Onset quality:  Gradual Duration:  1 week Timing:  Constant Associated symptoms: cough   Risk factors: tobacco use        Home Medications Prior to Admission medications   Medication Sig Start Date End Date Taking? Authorizing Provider  acetaminophen (TYLENOL) 500 MG tablet Take 1,000 mg by mouth every 6 (six) hours as needed.    [provider]  albuterol (VENTOLIN HFA) 108 (90 Base) MCG/ACT inhaler Inhale 1-2 puffs into the lungs every 6 (six) hours as needed for wheezing or shortness of breath. 02/08/20   Philip Aspen, Limmie Patricia, MD  alendronate (FOSAMAX) 70 MG tablet Take 1 tablet (70 mg total) by mouth once a week. full glass of water on an empty stomach. 02/08/20   Philip Aspen, Limmie Patricia, MD  anastrozole (ARIMIDEX) 1 MG tablet TAKE (1) TABLET BY MOUTH ONCE DAILY. Patient not taking: Reported on 05/24/2021 01/31/20   Serena Croissant, MD  ASHWAGANDHA PO Take by mouth.    [provider]  b complex vitamins capsule Take 1 capsule by mouth daily.    [provider]  BREO ELLIPTA 200-25 MCG/INH AEPB Inhale 1 puff into the lungs daily as needed. 09/28/19   Philip Aspen, Limmie Patricia, MD  budesonide-formoterol [facility]) 160-4.5 MCG/ACT inhaler Inhale 2 puffs into the lungs daily as needed.  02/08/20   Philip Aspen, Limmie Patricia, MD  calcium carbonate (OS-CAL) 1250 (500 Ca) MG chewable tablet Chew 1 tablet by mouth daily.    [provider]  chlorthalidone (HYGROTON) 25 MG tablet Take 1 tablet (25 mg total) by mouth daily. 02/08/20   Philip Aspen, Limmie Patricia, MD  clonazePAM (KLONOPIN) 0.5 MG tablet Take 1 tablet (0.5 mg total) by mouth 3 (three) times daily as needed (anxiety or sleep). 11/09/15   Pucilowska, Jolanta B, MD  dicyclomine (BENTYL) 20 MG tablet Take 20 mg by mouth every 6 (six) hours. 12/27/19   [provider]  fish oil-omega-3 fatty acids 1000 MG capsule Take 2 g by mouth daily.    [provider]  hydrochlorothiazide (HYDRODIURIL) 50 MG tablet TAKE 1 TABLET DAILY 11/21/21   Philip Aspen, Limmie Patricia, MD  hydrOXYzine (VISTARIL) 50 MG capsule Take 2 capsules (100 mg total) by mouth at bedtime. Patient taking differently: Take 200 mg by mouth at bedtime. 09/28/19   Philip Aspen, Limmie Patricia, MD  ketoconazole (NIZORAL) 2 % shampoo Apply 1 application topically 2 (two) times a week. 03/09/20   Philip Aspen, Limmie Patricia, MD  montelukast (SINGULAIR) 10 MG tablet Take 1 tablet (10 mg total) by mouth daily. 02/08/20   Philip Aspen, Limmie Patricia, MD  pantoprazole (PROTONIX) 40 MG tablet Take 1 tablet (40 mg total) by mouth daily. 02/08/20   Philip Aspen, Limmie Patricia,  MD  Probiotic Product (SOLUBLE FIBER/PROBIOTICS PO) Take by mouth.    [provider]  QUEtiapine (SEROQUEL) 100 MG tablet Take 100 mg by mouth at bedtime. 150-200mg     [provider]  sertraline (ZOLOFT) 100 MG tablet Take 100 mg by mouth daily.    [provider]  tiotropium (SPIRIVA HANDIHALER) 18 MCG inhalation capsule Place 1 capsule (18 mcg total) into inhaler and inhale daily. 02/08/20   Philip Aspen, Limmie Patricia, MD  traMADol (ULTRAM) 50 MG tablet Take 1 tablet (50 mg total) by mouth 2 (two) times daily. 06/13/20   Philip Aspen, Limmie Patricia, MD       Allergies    Bactrim [sulfamethoxazole-trimethoprim], Diazepam, Fluoxetine, Telmisartan, Tetracycline, Tetracyclines & related, Aspirin, Cefdinir, Clarithromycin, Nsaids, Risperidone, Trazodone, Atorvastatin, Praluent [alirocumab], Pravastatin, Repatha [evolocumab], Rosuvastatin, and Oxycodone    Review of Systems   Review of Systems  Respiratory:  Positive for cough and shortness of breath.     Physical Exam Updated Vital Signs BP (!) 182/123   Pulse 100   Resp 19   Ht 5\' 6"  (1.676 m)   Wt 65 kg   SpO2 98%   BMI 23.13 kg/m  Physical Exam Vitals and nursing note reviewed.  Constitutional:      General: She is in acute distress.     Appearance: She is well-developed. She is ill-appearing.  HENT:     Head: Normocephalic and atraumatic.  Eyes:     Conjunctiva/sclera: Conjunctivae normal.  Cardiovascular:     Rate and Rhythm: Normal rate and regular rhythm.     Heart sounds: No murmur heard. Pulmonary:     Effort: Tachypnea, accessory muscle usage and respiratory distress present.     Breath sounds: Wheezing and rhonchi present.  Abdominal:     Palpations: Abdomen is soft.     Tenderness: There is no abdominal tenderness. There is no guarding or rebound.  Musculoskeletal:        General: No swelling.     Cervical back: Neck supple.  Skin:    General: Skin is warm and dry.     Capillary Refill: Capillary refill takes less than 2 seconds.  Neurological:     General: No focal deficit present.     Mental Status: She is lethargic.     Motor: No weakness.     ED Results / Procedures / Treatments   Labs (all labs ordered are listed, but only abnormal results are displayed) Labs Reviewed  BASIC METABOLIC PANEL - Abnormal; Notable for the following components:      Result Value   Sodium 129 (*)    Chloride 79 (*)    CO2 36 (*)    Glucose, Bld 123 (*)    All other components within normal limits  CBC WITH DIFFERENTIAL/PLATELET - Abnormal; Notable for the following  components:   WBC 19.6 (*)    RBC 5.35 (*)    MCV 78.5 (*)    MCH 24.3 (*)    RDW 16.5 (*)    Platelets 455 (*)    Neutro Abs 18.2 (*)    All other components within normal limits  BRAIN NATRIURETIC PEPTIDE - Abnormal; Notable for the following components:   B Natriuretic Peptide 562.0 (*)    All other components within normal limits  BLOOD GAS, VENOUS - Abnormal; Notable for the following components:   pCO2, Ven 95 (*)    Bicarbonate 45.7 (*)    Acid-Base Excess 14.5 (*)    All other  components within normal limits  RESP PANEL BY RT-PCR (RSV, FLU A&B, COVID)  RVPGX2  CULTURE, BLOOD (ROUTINE X 2)  CULTURE, BLOOD (ROUTINE X 2)  LACTIC ACID, PLASMA  LACTIC ACID, PLASMA  BLOOD GAS, ARTERIAL  LEGIONELLA PNEUMOPHILA SEROGP 1 UR AG  STREP PNEUMONIAE URINARY ANTIGEN  HIV ANTIBODY (ROUTINE TESTING W REFLEX)  TROPONIN I (HIGH SENSITIVITY)  TROPONIN I (HIGH SENSITIVITY)    EKG EKG Interpretation Date/Time:  Thursday August 07 2023 11:26:44 EDT Ventricular Rate:  101 PR Interval:  130 QRS Duration:  81 QT Interval:  324 QTC Calculation: 420 R Axis:   57  Text Interpretation: Sinus tachycardia Right atrial enlargement Anteroseptal infarct, old No old tracing to compare Confirmed by Meridee Score [id number]) on 08/07/2023 11:40:40 AM  Radiology DG Chest Port 1 View Result Date: 08/07/2023 CLINICAL DATA:  Shortness of breath. EXAM: PORTABLE CHEST 1 VIEW COMPARISON:  April 28, 2018. FINDINGS: The heart size and mediastinal contours are within normal limits. Both lungs are clear. The visualized skeletal structures are unremarkable. IMPRESSION: No active disease. Electronically Signed   By: Lupita Raider M.D.   On: 08/07/2023 13:09    Procedures .Critical Care  Performed by: Terrilee Files, MD Authorized by: Terrilee Files, MD   Critical care provider statement:    Critical care time (minutes):  45   Critical care time was exclusive of:  Separately billable procedures and  treating other patients   Critical care was necessary to treat or prevent imminent or life-threatening deterioration of the following conditions:  Respiratory failure   Critical care was time spent personally by me on the following activities:  Development of treatment plan with patient or surrogate, discussions with consultants, evaluation of patient's response to treatment, examination of patient, obtaining history from patient or surrogate, ordering and performing treatments and interventions, ordering and review of laboratory studies, ordering and review of radiographic studies, pulse oximetry and re-evaluation of patient's condition   I assumed direction of critical care for this patient from another provider in my specialty: no       Medications Ordered in ED Medications  hydrOXYzine (VISTARIL) capsule 200 mg (has no administration in time range)  QUEtiapine (SEROQUEL) tablet 100 mg (has no administration in time range)  sertraline (ZOLOFT) tablet 100 mg (has no administration in time range)  pantoprazole (PROTONIX) EC tablet 40 mg (has no administration in time range)  clonazePAM (KLONOPIN) tablet 0.5 mg (has no administration in time range)  montelukast (SINGULAIR) tablet 10 mg (has no administration in time range)  sodium chloride flush (NS) 0.9 % injection 3 mL (has no administration in time range)  methylPREDNISolone sodium succinate (SOLU-MEDROL) 40 mg/mL injection 40 mg (has no administration in time range)  enoxaparin (LOVENOX) injection 40 mg (has no administration in time range)  acetaminophen (TYLENOL) tablet 650 mg (has no administration in time range)    Or  acetaminophen (TYLENOL) suppository 650 mg (has no administration in time range)  HYDROcodone-acetaminophen (NORCO/VICODIN) 5-325 MG per tablet 1-2 tablet (has no administration in time range)  morphine (PF) 2 MG/ML injection 2 mg (has no administration in time range)  nicotine (NICODERM CQ - dosed in mg/24 hours) patch  21 mg (has no administration in time range)  ipratropium-albuterol (DUONEB) 0.5-2.5 (3) MG/3ML nebulizer solution 3 mL (3 mLs Nebulization Given 08/07/23 1459)  levofloxacin (LEVAQUIN) IVPB 750 mg (750 mg Intravenous New Bag/Given 08/07/23 1417)  hydrALAZINE (APRESOLINE) injection 10 mg (10 mg Intravenous Given 08/07/23 1453)  albuterol (  PROVENTIL) (2.5 MG/3ML) 0.083% nebulizer solution (10 mg/hr Nebulization Given 08/07/23 1154)    ED Course/ Medical Decision Making/ A&P Clinical Course as of 08/07/23 1636  Thu Aug 07, 2023  1235 Chest x-ray with increased markings left upper lobe possible infiltrate.  Awaiting radiology reading. [MB]  1335 Discussed with [facility] hospitalist Dr. Sharlene Dory who will evaluate patient for admission. [MB]    Clinical Course User Index [MB] Terrilee Files, MD                                 Medical Decision Making Amount and/or Complexity of Data Reviewed Labs: ordered. Radiology: ordered.  Risk Prescription drug management. Decision regarding hospitalization.   This patient complains of shortness of breath; this involves an extensive number of treatment Options and is a complaint that carries with it a high risk of complications and morbidity. The differential includes COPD, CHF, ACS, pneumonia, COVID, flu, PE, hypercapnia  I ordered, reviewed and interpreted labs, which included CBC with elevated white count, chemistries with low sodium elevated bicarb, BNP elevated, troponins flat, lactate normal, VBG with normal pH although significantly elevated CO2, COVID and flu negative I ordered medication continuous nebs, BiPAP and reviewed PMP when indicated. I ordered imaging studies which included chest x-ray and I independently    visualized and interpreted imaging which showed no clear infiltrate Additional history obtained from EMS Previous records obtained and reviewed in epic including prior PCP notes I consulted [facility] hospitalist Dr. Gertie Exon and  discussed lab and imaging findings and discussed disposition.  Cardiac monitoring reviewed, sinus tachycardia Social determinants considered, tobacco use Critical Interventions: Initiation of BiPAP and respiratory treatments for presumptive COPD exacerbation  After the interventions stated above, I reevaluated the patient and found patient to be lethargic although will arouse and answer simple questions Admission and further testing considered, she would benefit from mission the hospital for further treatment.  Patient in agreement plan for admission         Final Clinical Impression(s) / ED Diagnoses Final diagnoses:  Acute metabolic encephalopathy  Acute respiratory failure with hypoxia and hypercapnia (HCC)  COPD exacerbation (HCC)    Rx / DC Orders ED Discharge Orders     None         Terrilee Files, MD 08/07/23 1640

## 2023-08-07 NOTE — H&P (Signed)
 History and Physical    Patient: Elizabeth Cherry ZOX:[id number] DOB: Oct 06, 1955 DOA: 08/07/2023 DOS: the patient was seen and examined on 08/07/2023 PCP: Rebekah Chesterfield, NP  Patient coming from: Home  Chief Complaint:  Chief Complaint  Patient presents with   Respiratory Distress   HPI: Elizabeth Cherry is a 68 y.o. female with medical history significant of COPD, asthma, right breast cancer, fibromyalgia, GERD, tobacco dependence, presenting with severe respiratory distress and worsening shortness of breath.  Per reports from EMS, patient has had worsening shortness of breath and wheezing for a week.  Showed up to doctor's office with noted O2 sats in the 60s.  Subsequently placed on 6 L nasal cannula with improvement in O2.  Patient had admitted that she had an illness initially with nasal congestion and sore throat that progressed to worsening shortness of breath.  Upon evaluation emergency department, patient is somewhat obtunded but minimally rousable.  Patient minimally able to answer questions.  Denies pain but admits to coughing and shortness of breath.  Otherwise review of systems unable to be obtained.  Currently on BiPAP.  ABG noting 7.2 9/95/32.  WBCs 19.6.  Review of Systems: unable to review all systems due to the inability of the patient to answer questions. Past Medical History:  Diagnosis Date   Alcoholism (HCC)    IN REMISSION ALMOST 20 YEARS   Allergic rhinitis    Anorectal hemorrhage    Anxiety    Asthma    Benign neoplasm of colon 10/24/2015   Borderline personality disorder (HCC)    Breast cancer of upper-inner quadrant of right female breast (HCC) 01/08/2016   Carotid stenosis, bilateral    Chronic gastritis without bleeding    OTHER, WITHOUT HEMORRHAGE   Chronic obstructive pulmonary disease (HCC) 11/15/2011   Chronic pain 10/24/2011   Overview:  Managed by [facility]. Gets Fentanyl Patches - sees them every other month.    COPD  (chronic obstructive pulmonary disease) (HCC)    Depression    Dyslipidemia    Edema    due to psychiatric meds per patient, on a diuretic   Elevated cholesterol    Fibromyalgia    Generalized anxiety disorder    GERD (gastroesophageal reflux disease)    Hypertension    no meds now   Irritable bowel syndrome    UNSPECIFIED TYPE   Malignant neoplasm of unspecified site of unspecified female breast (HCC)    Mixed hyperlipidemia    Multiple gastric ulcers    Personal history of colonic polyps    Psoriasis 02/11/2012   Sciatica    Self neglect 10/23/2015   Severe recurrent major depression without psychotic features (HCC) 10/23/2015   Tobacco dependence    Past Surgical History:  Procedure Laterality Date   BREAST LUMPECTOMY WITH RADIOACTIVE SEED AND SENTINEL LYMPH NODE BIOPSY Right 02/14/2016   Procedure: BREAST LUMPECTOMY WITH RADIOACTIVE SEED AND SENTINEL LYMPH NODE BIOPSY;  Surgeon: Glenna Fellows, MD;  Location: [facility];  Service: General;  Laterality: Right;   DIAGNOSTIC LAPAROSCOPY     DILATION AND CURETTAGE OF UTERUS     GYNECOLOGIC CRYOSURGERY     Social History:  reports that she has been smoking cigarettes. She has never used smokeless tobacco. She reports that she does not drink alcohol and does not use drugs.  Allergies  Allergen Reactions   Bactrim [Sulfamethoxazole-Trimethoprim] Hives   Diazepam Other (See Comments)    Severe depression   Fluoxetine Swelling and Anaphylaxis  Pt reports she is allergic to filler in prozac, her throat swell.  Pt reports she is allergic to filler in prozac, her throat swell.   Pt reports she is allergic to filler in prozac, her throat swell.  Pt reports she is allergic to filler in prozac, her throat swell.  Pt reports she is allergic to filler in prozac, her throat swell.  Pt reports she is allergic to filler in prozac, her throat swell.     Telmisartan Hives and Other (See Comments)    Other reaction(s):  Other (See Comments) Other Reaction: OTHER REACTION Other Reaction: OTHER REACTION Other reaction(s): Other (See Comments) Other Reaction: OTHER REACTION  Other Reaction: OTHER REACTION Other reaction(s): Other (See Comments) Other Reaction: OTHER REACTION Other Reaction: OTHER REACTION Other reaction(s): Other (See Comments) Other Reaction: OTHER REACTION Other Reaction: OTHER REACTION Other reaction(s): Other (See Comments) Other Reaction: OTHER REACTION Other Reaction: OTHER REACTION Other reaction(s): Other (See Comments) Other Reaction: OTHER REACTION    Tetracycline Hives and Rash   Tetracyclines & Related Hives and Rash   Aspirin Other (See Comments)    Gastric ulcers  Other Reaction: stomach ulcer Gastric ulcers Other Reaction: stomach ulcer Gastric ulcers    Cefdinir Other (See Comments)    Other reaction(s): Other (See Comments) Other Reaction: EXTREME EXHAUSTION Other Reaction: EXTREME EXHAUSTION Other reaction(s): Other (See Comments) Other Reaction: EXTREME EXHAUSTION  Other Reaction: EXTREME EXHAUSTION Other reaction(s): Other (See Comments) Other Reaction: EXTREME EXHAUSTION Other Reaction: EXTREME EXHAUSTION Other reaction(s): Other (See Comments) Other Reaction: EXTREME EXHAUSTION Other Reaction: EXTREME EXHAUSTION Other reaction(s): Other (See Comments) Other Reaction: EXTREME EXHAUSTION Other Reaction: EXTREME EXHAUSTION Other reaction(s): Other (See Comments) Other Reaction: EXTREME EXHAUSTION    Clarithromycin Other (See Comments)    unknown   Nsaids Other (See Comments)    Gastric ulcers  Other Reaction: stomach ulcer Other Reaction: stomach ulcer  Gastric ulcers   Risperidone Nausea And Vomiting   Trazodone Other (See Comments)    Dry eyes Dry eyes Dry eyes  Dry eyes Dry eyes Dry eyes Dry eyes Dry eyes Dry eyes Dry eyes Dry eyes    Atorvastatin     20mg  caused myalgias   Praluent [Alirocumab]     Joint pain    Pravastatin     40mg  caused myalgias   Repatha [Evolocumab]     Myalgias and flu like symptoms   Rosuvastatin     myalgias   Oxycodone Nausea Only    Family History  Problem Relation Age of Onset   Colon cancer Maternal Grandmother     Prior to Admission medications   Medication Sig Start Date End Date Taking? Authorizing Provider  acetaminophen (TYLENOL) 500 MG tablet Take 1,000 mg by mouth every 6 (six) hours as needed.    [provider]  albuterol (VENTOLIN HFA) 108 (90 Base) MCG/ACT inhaler Inhale 1-2 puffs into the lungs every 6 (six) hours as needed for wheezing or shortness of breath. 02/08/20   Philip Aspen, Limmie Patricia, MD  alendronate (FOSAMAX) 70 MG tablet Take 1 tablet (70 mg total) by mouth once a week. full glass of water on an empty stomach. 02/08/20   Philip Aspen, Limmie Patricia, MD  anastrozole (ARIMIDEX) 1 MG tablet TAKE (1) TABLET BY MOUTH ONCE DAILY. Patient not taking: Reported on 05/24/2021 01/31/20   Serena Croissant, MD  ASHWAGANDHA PO Take by mouth.    [provider]  b complex vitamins capsule Take 1 capsule by mouth daily.    [provider]  BREO ELLIPTA 200-25 MCG/INH AEPB Inhale 1 puff into the lungs daily as needed. 09/28/19   Philip Aspen, Limmie Patricia, MD  budesonide-formoterol [facility]) 160-4.5 MCG/ACT inhaler Inhale 2 puffs into the lungs daily as needed. 02/08/20   Philip Aspen, Limmie Patricia, MD  calcium carbonate (OS-CAL) 1250 (500 Ca) MG chewable tablet Chew 1 tablet by mouth daily.    [provider]  chlorthalidone (HYGROTON) 25 MG tablet Take 1 tablet (25 mg total) by mouth daily. 02/08/20   Philip Aspen, Limmie Patricia, MD  clonazePAM (KLONOPIN) 0.5 MG tablet Take 1 tablet (0.5 mg total) by mouth 3 (three) times daily as needed (anxiety or sleep). 11/09/15   Pucilowska, Jolanta B, MD  dicyclomine (BENTYL) 20 MG tablet Take 20 mg by mouth every 6 (six) hours. 12/27/19   [provider]  fish oil-omega-3 fatty acids  1000 MG capsule Take 2 g by mouth daily.    [provider]  hydrochlorothiazide (HYDRODIURIL) 50 MG tablet TAKE 1 TABLET DAILY 11/21/21   Philip Aspen, Limmie Patricia, MD  hydrOXYzine (VISTARIL) 50 MG capsule Take 2 capsules (100 mg total) by mouth at bedtime. Patient taking differently: Take 200 mg by mouth at bedtime. 09/28/19   Philip Aspen, Limmie Patricia, MD  ketoconazole (NIZORAL) 2 % shampoo Apply 1 application topically 2 (two) times a week. 03/09/20   Philip Aspen, Limmie Patricia, MD  montelukast (SINGULAIR) 10 MG tablet Take 1 tablet (10 mg total) by mouth daily. 02/08/20   Philip Aspen, Limmie Patricia, MD  pantoprazole (PROTONIX) 40 MG tablet Take 1 tablet (40 mg total) by mouth daily. 02/08/20   Philip Aspen, Limmie Patricia, MD  Probiotic Product (SOLUBLE FIBER/PROBIOTICS PO) Take by mouth.    [provider]  QUEtiapine (SEROQUEL) 100 MG tablet Take 100 mg by mouth at bedtime. 150-200mg     [provider]  sertraline (ZOLOFT) 100 MG tablet Take 100 mg by mouth daily.    [provider]  tiotropium (SPIRIVA HANDIHALER) 18 MCG inhalation capsule Place 1 capsule (18 mcg total) into inhaler and inhale daily. 02/08/20   Philip Aspen, Limmie Patricia, MD  traMADol (ULTRAM) 50 MG tablet Take 1 tablet (50 mg total) by mouth 2 (two) times daily. 06/13/20   Philip Aspen, Limmie Patricia, MD    Physical Exam: Vitals:   08/07/23 1127 08/07/23 1131 08/07/23 1315  BP:  (!) 182/123 138/81  Pulse:  100 98  Resp:  19 (!) 21  Temp:  97.7 F (36.5 C)   TempSrc:  Oral   SpO2:  98% 95%  Weight: 65 kg    Height: 5\' 6"  (1.676 m)     GENERAL: Obtunded, ill-appearing, in moderate acute distress HEENT:  EOMI, BiPAP mask CARDIOVASCULAR:  RRR, no murmurs appreciated RESPIRATORY: Poor air movement bilaterally, dyspneic GASTROINTESTINAL:  Soft, nontender, nondistended EXTREMITIES: Thin, no LE edema bilaterally NEURO:  No new focal deficits appreciated SKIN: Scattered ecchymosis   PSYCH:  Appropriate mood and affect   Data Reviewed:  Chest x-ray personally reviewed noting no frank consolidation or opacities.  Assessment and Plan:  Acute metabolic encephalopathy - Likely secondary to hypercapnia.  Will continue to monitor closely.  Acute on chronic hypoxic and hypercapnic respiratory failure - ABG 7.29/95/32.  Will worsening encephalopathy when pulled off of BiPAP initially in the emergency department.  Placed back on BiPAP.  O2 showing improvement.  Continue to monitor O2 sat.  Will recheck ABG later this evening and in AM.  Will continue in ICU  for now.  Acute COPD exacerbation - 1 week of worsening shortness of breath with history of underlying COPD.  Initiated on IV Solu-Medrol 40 mg every 12 hours.  Scheduled DuoNebs.  Will order viral panel, sputum cultures, urine antigens.  Chest x-ray personally reviewed showing no frank consolidations however some mild haziness, cannot rule out atypical or viral infection.  Placed on empiric Levaquin for now.  Concern for sepsis - Dyspnea, tachypnea, leukocytosis, on presentation give concern for sepsis.  Blood cultures, IV fluid hydration, empiric antibiotics initiated.  Monitor blood pressure and blood cultures closely.  Tobacco dependence - Encourage cessation.  Nicotine patch as needed.  Hypertension - Likely exacerbated by respiratory failure.  Hydralazine IV as needed.  Neurolyse anxiety disorder - Resume home Xanax.     Advance Care Planning:   Code Status: Full Code   Consults: None  Family Communication: None at bedside  Severity of Illness: The appropriate patient status for this patient is INPATIENT. Inpatient status is judged to be reasonable and necessary in order to provide the required intensity of service to ensure the patient's safety. The patient's presenting symptoms, physical exam findings, and initial radiographic and laboratory data in the context of their chronic comorbidities is felt to  place them at high risk for further clinical deterioration. Furthermore, it is not anticipated that the patient will be medically stable for discharge from the hospital within 2 midnights of admission.   * I certify that at the point of admission it is my clinical judgment that the patient will require inpatient hospital care spanning beyond 2 midnights from the point of admission due to high intensity of service, high risk for further deterioration and high frequency of surveillance required.*  Author: Deanna Artis, DO 08/07/2023 1:59 PM  For on call review [email or web address].

## 2023-08-07 NOTE — ED Triage Notes (Signed)
 Pt BIB [facility] on Bipap, respiratory distress from PCP,  per EMS pt has had cough, SOB and weakness for 1 weeks, in 70s on room air, place on O2 in 90s, on Bipap 100%, history of retaining C02.

## 2023-08-07 NOTE — ED Notes (Signed)
 Elizabeth Cherry [phone], pt's medical POA, if you have any questions.

## 2023-08-07 NOTE — ED Triage Notes (Signed)
 Given 2 duonebs, 125 mg Solumedrol and IV magnesium by EMS.

## 2023-08-07 NOTE — Progress Notes (Signed)
 Pharmacy Antibiotic Note  Elizabeth Cherry is a 68 y.o. female admitted on 08/07/2023 with pneumonia and COPD .  Pharmacy has been consulted for levaquin dosing.  Plan: Levaquin 750 mg IV every 24 hours  Height: 5\' 6"  (167.6 cm) Weight: 65 kg (143 lb 4.8 oz) IBW/kg (Calculated) : 59.3  Temp (24hrs), Avg:97.7 F (36.5 C), Min:97.7 F (36.5 C), Max:97.7 F (36.5 C)  Recent Labs  Lab 08/07/23 1159 08/07/23 1203  WBC 19.6*  --   CREATININE 0.62  --   LATICACIDVEN  --  1.5    Estimated Creatinine Clearance: 65.6 mL/min (by C-G formula based on SCr of 0.62 mg/dL).    Allergies  Allergen Reactions   Bactrim [Sulfamethoxazole-Trimethoprim] Hives   Diazepam Other (See Comments)    Severe depression   Fluoxetine Swelling and Anaphylaxis    Pt reports she is allergic to filler in prozac, her throat swell.  Pt reports she is allergic to filler in prozac, her throat swell.   Pt reports she is allergic to filler in prozac, her throat swell.  Pt reports she is allergic to filler in prozac, her throat swell.  Pt reports she is allergic to filler in prozac, her throat swell.  Pt reports she is allergic to filler in prozac, her throat swell.     Telmisartan Hives and Other (See Comments)    Other reaction(s): Other (See Comments) Other Reaction: OTHER REACTION Other Reaction: OTHER REACTION Other reaction(s): Other (See Comments) Other Reaction: OTHER REACTION  Other Reaction: OTHER REACTION Other reaction(s): Other (See Comments) Other Reaction: OTHER REACTION Other Reaction: OTHER REACTION Other reaction(s): Other (See Comments) Other Reaction: OTHER REACTION Other Reaction: OTHER REACTION Other reaction(s): Other (See Comments) Other Reaction: OTHER REACTION Other Reaction: OTHER REACTION Other reaction(s): Other (See Comments) Other Reaction: OTHER REACTION    Tetracycline Hives and Rash   Tetracyclines & Related Hives and Rash   Aspirin Other (See Comments)     Gastric ulcers  Other Reaction: stomach ulcer Gastric ulcers Other Reaction: stomach ulcer Gastric ulcers    Cefdinir Other (See Comments)    Other reaction(s): Other (See Comments) Other Reaction: EXTREME EXHAUSTION Other Reaction: EXTREME EXHAUSTION Other reaction(s): Other (See Comments) Other Reaction: EXTREME EXHAUSTION  Other Reaction: EXTREME EXHAUSTION Other reaction(s): Other (See Comments) Other Reaction: EXTREME EXHAUSTION Other Reaction: EXTREME EXHAUSTION Other reaction(s): Other (See Comments) Other Reaction: EXTREME EXHAUSTION Other Reaction: EXTREME EXHAUSTION Other reaction(s): Other (See Comments) Other Reaction: EXTREME EXHAUSTION Other Reaction: EXTREME EXHAUSTION Other reaction(s): Other (See Comments) Other Reaction: EXTREME EXHAUSTION    Clarithromycin Other (See Comments)    unknown   Nsaids Other (See Comments)    Gastric ulcers  Other Reaction: stomach ulcer Other Reaction: stomach ulcer  Gastric ulcers   Risperidone Nausea And Vomiting   Trazodone Other (See Comments)    Dry eyes Dry eyes Dry eyes  Dry eyes Dry eyes Dry eyes Dry eyes Dry eyes Dry eyes Dry eyes Dry eyes    Atorvastatin     20mg  caused myalgias   Praluent [Alirocumab]     Joint pain   Pravastatin     40mg  caused myalgias   Repatha [Evolocumab]     Myalgias and flu like symptoms   Rosuvastatin     myalgias   Oxycodone Nausea Only    Antimicrobials this admission: Levaqui 3/20 >>  Microbiology results: 3/20 BCx: pending   Thank you for allowing pharmacy to be a part of this patient's care.  Tad Moore 08/07/2023  2:04 PM

## 2023-08-07 NOTE — Plan of Care (Signed)

## 2023-08-08 LAB — BASIC METABOLIC PANEL
Anion gap: 12 (ref 5–15)
BUN: 17 mg/dL (ref 8–23)
CO2: 36 mmol/L — ABNORMAL HIGH (ref 22–32)
Calcium: 8.1 mg/dL — ABNORMAL LOW (ref 8.9–10.3)
Chloride: 84 mmol/L — ABNORMAL LOW (ref 98–111)
Creatinine, Ser: 0.62 mg/dL (ref 0.44–1.00)
GFR, Estimated: >60 mL/min (ref >60)
Glucose, Bld: 97 mg/dL (ref 70–99)
Potassium: 3.9 mmol/L (ref 3.5–5.1)
Sodium: 132 mmol/L — ABNORMAL LOW (ref 135–145)

## 2023-08-08 LAB — CBC
HCT: 41.5 % (ref 36.0–46.0)
Hemoglobin: 12.5 g/dL (ref 12.0–15.0)
MCH: 23.6 pg — ABNORMAL LOW (ref 26.0–34.0)
MCHC: 30.1 g/dL (ref 30.0–36.0)
MCV: 78.3 fL — ABNORMAL LOW (ref 80.0–100.0)
Platelets: 408 10*3/uL — ABNORMAL HIGH (ref 150–400)
RBC: 5.3 MIL/uL — ABNORMAL HIGH (ref 3.87–5.11)
RDW: 16.2 % — ABNORMAL HIGH (ref 11.5–15.5)
WBC: 15.9 10*3/uL — ABNORMAL HIGH (ref 4.0–10.5)
nRBC: 0 % (ref 0.0–0.2)

## 2023-08-08 LAB — MRSA NEXT GEN BY PCR, NASAL: MRSA by PCR Next Gen: NOT DETECTED

## 2023-08-08 LAB — BLOOD GAS, ARTERIAL
Acid-Base Excess: 17 mmol/L — ABNORMAL HIGH (ref 0.0–2.0)
Bicarbonate: 44.5 mmol/L — ABNORMAL HIGH (ref 20.0–28.0)
Drawn by: 28340
O2 Saturation: 96 %
Patient temperature: 37
pCO2 arterial: 64 mmHg — ABNORMAL HIGH (ref 32–48)
pH, Arterial: 7.45 (ref 7.35–7.45)
pO2, Arterial: 70 mmHg — ABNORMAL LOW (ref 83–108)

## 2023-08-08 LAB — HIV ANTIBODY (ROUTINE TESTING W REFLEX): HIV Screen 4th Generation wRfx: NONREACTIVE

## 2023-08-08 MED ORDER — BUDESON-GLYCOPYRROL-FORMOTEROL 160-9-4.8 MCG/ACT IN AERO: 2.0000 | INHALATION_SPRAY | Freq: Two times a day (BID) | RESPIRATORY_TRACT | Status: DC

## 2023-08-08 MED ORDER — MOMETASONE FURO-FORMOTEROL FUM 100-5 MCG/ACT IN AERO
2.0000 | INHALATION_SPRAY | Freq: Two times a day (BID) | RESPIRATORY_TRACT | Status: DC
  Administered 2023-08-08 – 2023-08-20 (×19): 2 via RESPIRATORY_TRACT
  Filled 2023-08-08 (×2): qty 8.8

## 2023-08-08 MED ORDER — ALBUTEROL SULFATE (2.5 MG/3ML) 0.083% IN NEBU: 2.5000 mg | INHALATION_SOLUTION | RESPIRATORY_TRACT | Status: DC | PRN

## 2023-08-08 MED ORDER — CHLORHEXIDINE GLUCONATE CLOTH 2 % EX PADS
6.0000 | MEDICATED_PAD | Freq: Every day | CUTANEOUS | Status: DC
  Administered 2023-08-08 – 2023-08-11 (×4): 6 via TOPICAL

## 2023-08-08 NOTE — Evaluation (Signed)
 Physical Therapy Evaluation Patient Details Name: Elizabeth Cherry MRN: [id number] DOB: 1956/04/20 Today's Date: 08/08/2023  History of Present Illness  Elizabeth Cherry is a 68 y.o. female with medical history significant of COPD, asthma, right breast cancer, fibromyalgia, GERD, tobacco dependence, presenting with severe respiratory distress and worsening shortness of breath.  Per reports from EMS, patient has had worsening shortness of breath and wheezing for a week.  Showed up to doctor's office with noted O2 sats in the 60s.  Subsequently placed on 6 L nasal cannula with improvement in O2.  Patient had admitted that she had an illness initially with nasal congestion and sore throat that progressed to worsening shortness of breath.   Clinical Impression  Patient presents lethargic and had difficulty sitting up and maintaining sitting balance initially, but later became more alert and able to take side steps at bedside without loss of balance and tolerated sitting up in chair after therapy. Patient limited for activities mostly due to on BiPaP. Patient will benefit from continued skilled physical therapy in hospital and recommended venue below to increase strength, balance, endurance for safe ADLs and gait.         If plan is discharge home, recommend the following: A little help with walking and/or transfers;A little help with bathing/dressing/bathroom;Help with stairs or ramp for entrance;Assistance with cooking/housework   Can travel by private vehicle        Equipment Recommendations None recommended by PT  Recommendations for Other Services       Functional Status Assessment Patient has had a recent decline in their functional status and demonstrates the ability to make significant improvements in function in a reasonable and predictable amount of time.     Precautions / Restrictions Precautions Precautions: Fall Recall of Precautions/Restrictions: Impaired Restrictions Weight  Bearing Restrictions Per Provider Order: No      Mobility  Bed Mobility Overal bed mobility: Needs Assistance Bed Mobility: Supine to Sit     Supine to sit: Max assist     General bed mobility comments: limited  mostly due lethargy    Transfers Overall transfer level: Needs assistance Equipment used: Rolling walker (2 wheels) Transfers: Sit to/from Stand, Bed to chair/wheelchair/BSC Sit to Stand: Contact guard assist   Step pivot transfers: Contact guard assist       General transfer comment: slightly labored movement    Ambulation/Gait Ambulation/Gait assistance: Contact guard assist, Min assist Gait Distance (Feet): 5 Feet Assistive device: Rolling walker (2 wheels) Gait Pattern/deviations: Decreased step length - right, Decreased step length - left, Decreased stride length Gait velocity: slow     General Gait Details: limited to a few side steps without loss of balance mostly due to difficulty breathing and on BiPAP  Stairs            Wheelchair Mobility     Tilt Bed    Modified Rankin (Stroke Patients Only)       Balance Overall balance assessment: Needs assistance Sitting-balance support: Feet supported, No upper extremity supported Sitting balance-Leahy Scale: Poor Sitting balance - Comments: fair/poor seated at EOB Postural control: Right lateral lean Standing balance support: During functional activity, Bilateral upper extremity supported Standing balance-Leahy Scale: Fair Standing balance comment: using RW                             Pertinent Vitals/Pain Pain Assessment Pain Assessment: Faces Faces Pain Scale: No hurt    Home Living Family/patient  expects to be discharged to:: Private residence Living Arrangements: Alone Available Help at Discharge: Family;Friend(s);Available PRN/intermittently Type of Home: House Home Access: Level entry         Home Equipment: Agricultural consultant (2 wheels)      Prior Function  Prior Level of Function : Independent/Modified Independent             Mobility Comments: Tourist information centre manager without AD ADLs Comments: Independent per pt     Extremity/Trunk Assessment   Upper Extremity Assessment Upper Extremity Assessment: Defer to OT evaluation    Lower Extremity Assessment Lower Extremity Assessment: Generalized weakness    Cervical / Trunk Assessment Cervical / Trunk Assessment: Normal  Communication   Communication Communication: No apparent difficulties    Cognition Arousal: Alert Behavior During Therapy: WFL for tasks assessed/performed   PT - Cognitive impairments: No apparent impairments                         Following commands: Intact       Cueing Cueing Techniques: Verbal cues, Tactile cues     General Comments      Exercises     Assessment/Plan    PT Assessment Patient needs continued PT services  PT Problem List Decreased strength;Decreased activity tolerance;Decreased balance;Decreased mobility       PT Treatment Interventions DME instruction;Gait training;Stair training;Functional mobility training;Therapeutic activities;Therapeutic exercise;Balance training;Patient/family education    PT Goals (Current goals can be found in the Care Plan section)  Acute Rehab PT Goals Patient Stated Goal: return home with family to assist PT Goal Formulation: With patient Time For Goal Achievement: 08/15/23 Potential to Achieve Goals: Good    Frequency Min 3X/week     Co-evaluation PT/OT/SLP Co-Evaluation/Treatment: Yes Reason for Co-Treatment: To address functional/ADL transfers PT goals addressed during session: Mobility/safety with mobility;Balance;Proper use of DME         AM-PAC PT "6 Clicks" Mobility  Outcome Measure Help needed turning from your back to your side while in a flat bed without using bedrails?: A Little Help needed moving from lying on your back to sitting on the side of a flat bed without  using bedrails?: A Lot Help needed moving to and from a bed to a chair (including a wheelchair)?: A Little Help needed standing up from a chair using your arms (e.g., wheelchair or bedside chair)?: A Little Help needed to walk in hospital room?: A Little Help needed climbing 3-5 steps with a railing? : A Lot 6 Click Score: 16    End of Session Equipment Utilized During Treatment: Oxygen Activity Tolerance: Patient tolerated treatment well;Patient limited by fatigue Patient left: in chair;with call bell/phone within reach;with chair alarm set Nurse Communication: Mobility status PT Visit Diagnosis: Unsteadiness on feet (R26.81);Other abnormalities of gait and mobility (R26.89);Muscle weakness (generalized) (M62.81)    Time: 8119-1478 PT Time Calculation (min) (ACUTE ONLY): 27 min   Charges:   PT Evaluation $PT Eval Moderate Complexity: 1 Mod PT Treatments $Therapeutic Activity: 23-37 mins PT General Charges $$ ACUTE PT VISIT: 1 Visit         2:41 PM, 08/08/23 Ocie Bob, MPT Physical Therapist with [facility] [phone] office [phone] mobile phone

## 2023-08-08 NOTE — Plan of Care (Signed)
  Problem: Acute Rehab PT Goals(only PT should resolve) Goal: Pt Will Go Supine/Side To Sit Outcome: Progressing Flowsheets (Taken 08/08/2023 1442) Pt will go Supine/Side to Sit: with supervision Goal: Patient Will Transfer Sit To/From Stand Outcome: Progressing Flowsheets (Taken 08/08/2023 1442) Patient will transfer sit to/from stand: with supervision Goal: Pt Will Transfer Bed To Chair/Chair To Bed Outcome: Progressing Flowsheets (Taken 08/08/2023 1442) Pt will Transfer Bed to Chair/Chair to Bed: with supervision Goal: Pt Will Ambulate Outcome: Progressing Flowsheets (Taken 08/08/2023 1442) Pt will Ambulate:  50 feet  with supervision  with rolling walker  with least restrictive assistive device   2:43 PM, 08/08/23 Ocie Bob, MPT Physical Therapist with [facility] [phone] office [phone] mobile phone

## 2023-08-08 NOTE — TOC Initial Note (Signed)
 Transition of Care [facility]) - Initial/Assessment Note    Patient Details  Name: Elizabeth Cherry MRN: [id number] Date of Birth: March 15, 1956  [facility] CM/SW Contact:    Villa Herb, LCSWA Phone Number: 08/08/2023, 3:17 PM  Clinical Narrative:                 CSW updated by RN that pts only contact Maureen Ralphs who is also [facility] POA is requesting call. CSW spoke to [place], she states pt lives alone and has no family. Pt has neighbor who tries to assist as she can in the home. CSW inquired about SNF vs HH. Maureen Ralphs states pt would likely benefit from SNF but will likely want to return home. CSW explained that this will be pts decision and Maureen Ralphs is understanding. TOC to follow with pt on D/C plan. TOC to follow.   Expected Discharge Plan: Home w Home Health Services Barriers to Discharge: Continued Medical Work up   Patient Goals and CMS Choice Patient states their goals for this hospitalization and ongoing recovery are:: get stronger CMS Medicare.gov Compare Post Acute Care list provided to:: Patient Choice offered to / list presented to : Patient, [facility] POA / Guardian      Expected Discharge Plan and Services In-house Referral: Clinical Social Work Discharge Planning Services: CM Consult   Living arrangements for the past 2 months: Single Family Home                                      Prior Living Arrangements/Services Living arrangements for the past 2 months: Single Family Home Lives with:: Self Patient language and need for interpreter reviewed:: Yes Do you feel safe going back to the place where you live?: Yes      Need for Family Participation in Patient Care: Yes (Comment) Care giver support system in place?: Yes (comment)   Criminal Activity/Legal Involvement Pertinent to Current Situation/Hospitalization: No - Comment as needed  Activities of Daily Living   ADL Screening (condition at time of admission) Independently performs ADLs?: Yes (appropriate  for developmental age) Is the patient deaf or have difficulty hearing?: No Does the patient have difficulty seeing, even when wearing glasses/contacts?: No Does the patient have difficulty concentrating, remembering, or making decisions?: Yes  Permission Sought/Granted                  Emotional Assessment Appearance:: Appears stated age Attitude/Demeanor/Rapport: Engaged Affect (typically observed): Accepting Orientation: : Oriented to Self, Oriented to Place, Oriented to  Time, Oriented to Situation Alcohol / Substance Use: Not Applicable Psych Involvement: No (comment)  Admission diagnosis:  Acute hypoxic respiratory failure (HCC) [J96.01] Patient Active Problem List   Diagnosis Date Noted   Acute hypoxic respiratory failure (HCC) 08/07/2023   Vitamin D deficiency 03/02/2020   Statin myopathy 02/03/2019   Hyperlipidemia 12/08/2018   Breast cancer of upper-inner quadrant of right female breast (HCC) 01/08/2016   Tobacco use disorder 10/24/2015   Benign neoplasm of colon 10/24/2015   Alcohol use disorder, severe, in sustained remission (HCC) 10/24/2015   Borderline personality disorder (HCC) 10/24/2015   Severe recurrent major depression without psychotic features (HCC) 10/23/2015   Hypertension 10/23/2015   Self neglect 10/23/2015   Psoriasis 02/11/2012   Chronic obstructive pulmonary disease (HCC) 11/15/2011   Fibromyalgia 11/15/2011   Chronic pain 10/24/2011   PCP:  Rebekah Chesterfield, NP Pharmacy:   [facility] [place], [place] - [address] [place] [address] Phone: [phone] Fax: [phone]     Social Drivers of Health (SDOH) Social History: SDOH Screenings   Depression [phone]): Low Risk  (02/08/2020)  Social Connections: Unknown (10/01/2021)   Received from [facility]  Tobacco Use: High Risk (08/07/2023)   SDOH Interventions:     Readmission Risk Interventions     No data to  display

## 2023-08-08 NOTE — Plan of Care (Signed)

## 2023-08-08 NOTE — Care Management Important Message (Signed)
 Important Message  Patient Details  Name: Elizabeth Cherry MRN: [id number] Date of Birth: 05-17-1956   Important Message Given:  N/A - LOS <3 / Initial given by admissions     Corey Harold 08/08/2023, 2:26 PM

## 2023-08-08 NOTE — Plan of Care (Signed)
  Problem: Acute Rehab OT Goals (only OT should resolve) Goal: Pt. Will Perform Grooming Flowsheets (Taken 08/08/2023 1054) Pt Will Perform Grooming: with modified independence Goal: Pt. Will Perform Upper Body Dressing Flowsheets (Taken 08/08/2023 1054) Pt Will Perform Upper Body Dressing: with modified independence Goal: Pt. Will Perform Lower Body Dressing Flowsheets (Taken 08/08/2023 1054) Pt Will Perform Lower Body Dressing: with modified independence Goal: Pt. Will Transfer To Toilet Flowsheets (Taken 08/08/2023 1054) Pt Will Transfer to Toilet: with modified independence Goal: Pt. Will Perform Toileting-Clothing Manipulation Flowsheets (Taken 08/08/2023 1054) Pt Will Perform Toileting - Clothing Manipulation and hygiene: with modified independence Goal: Pt/Caregiver Will Perform Home Exercise Program Flowsheets (Taken 08/08/2023 1054) Pt/caregiver will Perform Home Exercise Program:  Increased strength  Independently  Both right and left upper extremity  Vandy Tsuchiya OT, MOT

## 2023-08-08 NOTE — Evaluation (Signed)
 Occupational Therapy Evaluation Patient Details Name: Jaspreet Bodner MRN: [id number] DOB: 1956-01-10 Today's Date: 08/08/2023   History of Present Illness   Shaquel Gabrianna Fassnacht is a 68 y.o. female with medical history significant of COPD, asthma, right breast cancer, fibromyalgia, GERD, tobacco dependence, presenting with severe respiratory distress and worsening shortness of breath.  Per reports from EMS, patient has had worsening shortness of breath and wheezing for a week.  Showed up to doctor's office with noted O2 sats in the 60s.  Subsequently placed on 6 L nasal cannula with improvement in O2.  Patient had admitted that she had an illness initially with nasal congestion and sore throat that progressed to worsening shortness of breath. (per DO)     Clinical Impressions Pt agreeable to OT and PT co-evaluation. Pt noted to be very lethargic with need for max A to sit at EOB. As arousal increased to did's pt's sitting balance and ability to answer relevant home history. Pt demonstrates the need for CGA for step pivot to chair with RW. B UE generally weak with some assist for ADL's mostly due to poor postural stability for the first few minutes of sitting. Pt left in the chair with the call bell within reach and chair alarm set. Pt will benefit from continued OT in the hospital and recommended venue below to increase strength, balance, and endurance for safe ADL's.        If plan is discharge home, recommend the following:   A little help with walking and/or transfers;A little help with bathing/dressing/bathroom;Assistance with cooking/housework;Assist for transportation;Help with stairs or ramp for entrance     Functional Status Assessment   Patient has had a recent decline in their functional status and demonstrates the ability to make significant improvements in function in a reasonable and predictable amount of time.     Equipment Recommendations   None recommended by OT      Recommendations for Other Services         Precautions/Restrictions   Precautions Precautions: Fall Recall of Precautions/Restrictions: Impaired Restrictions Weight Bearing Restrictions Per Provider Order: No     Mobility Bed Mobility Overal bed mobility: Needs Assistance Bed Mobility: Supine to Sit     Supine to sit: Max assist     General bed mobility comments: Max A but pt was very lethargic. Once pt began to wake her sitting balance improved.    Transfers Overall transfer level: Needs assistance Equipment used: Rolling walker (2 wheels) Transfers: Sit to/from Stand, Bed to chair/wheelchair/BSC Sit to Stand: Contact guard assist Stand pivot transfers: Contact guard assist         General transfer comment: Mildly unsteady; use of RW      Balance Overall balance assessment: Needs assistance Sitting-balance support: No upper extremity supported, Feet supported Sitting balance-Leahy Scale: Poor Sitting balance - Comments: R lateral lean seated at EOB initially; improved with increased arousal and  RUE support. Postural control: Right lateral lean Standing balance support: Bilateral upper extremity supported, During functional activity Standing balance-Leahy Scale: Fair Standing balance comment: using RW                           ADL either performed or assessed with clinical judgement   ADL Overall ADL's : Needs assistance/impaired     Grooming: Contact guard assist;Standing   Upper Body Bathing: Set up;Sitting;Contact guard assist   Lower Body Bathing: Contact guard assist;Sitting/lateral leans   Upper Body Dressing :  Set up;Contact guard assist;Sitting   Lower Body Dressing: Contact guard assist;Sitting/lateral leans   Toilet Transfer: Contact guard assist;Rolling walker (2 wheels);Stand-pivot Statistician Details (indicate cue type and reason): Simulated via EOB to chair transfer Toileting- Clothing Manipulation and Hygiene:  Contact guard assist;Sitting/lateral lean       Functional mobility during ADLs: Contact guard assist;Rolling walker (2 wheels)       Vision Baseline Vision/History: 1 Wears glasses Ability to See in Adequate Light: 1 Impaired Patient Visual Report: No change from baseline Vision Assessment?: No apparent visual deficits     Perception Perception: Not tested       Praxis Praxis: Not tested       Pertinent Vitals/Pain Pain Assessment Pain Assessment: Faces Faces Pain Scale: No hurt     Extremity/Trunk Assessment Upper Extremity Assessment Upper Extremity Assessment: Generalized weakness   Lower Extremity Assessment Lower Extremity Assessment: Defer to PT evaluation   Cervical / Trunk Assessment Cervical / Trunk Assessment: Normal   Communication Communication Communication: No apparent difficulties   Cognition Arousal: Lethargic Behavior During Therapy: WFL for tasks assessed/performed Cognition: No family/caregiver present to determine baseline             OT - Cognition Comments: Pt rpeorted feeling tired and unable to recall some home living questions.                 Following commands: Intact       Cueing  General Comments   Cueing Techniques: Verbal cues;Tactile cues  Pt on Bipap during evalaution              Home Living Family/patient expects to be discharged to:: Private residence Living Arrangements: Alone Available Help at Discharge: Family;Friend(s);Available PRN/intermittently Type of Home: House Home Access: Level entry           Bathroom Shower/Tub: Tub/shower unit   Bathroom Toilet: Standard Bathroom Accessibility:  (pt unsure)   Home Equipment: Rolling Walker (2 wheels)   Additional Comments: Pt reports she thinks she could get 24/7 assist if needed.      Prior Functioning/Environment Prior Level of Function : Independent/Modified Independent             Mobility Comments: Tourist information centre manager without  AD ADLs Comments: Independent per pt    OT Problem List: Decreased strength;Decreased activity tolerance;Impaired balance (sitting and/or standing)   OT Treatment/Interventions: Self-care/ADL training;Therapeutic exercise;DME and/or AE instruction;Therapeutic activities;Patient/family education      OT Goals(Current goals can be found in the care plan section)   Acute Rehab OT Goals Patient Stated Goal: return home OT Goal Formulation: With patient Time For Goal Achievement: 08/22/23 Potential to Achieve Goals: Good   OT Frequency:  Min 1X/week    Co-evaluation PT/OT/SLP Co-Evaluation/Treatment: Yes Reason for Co-Treatment: To address functional/ADL transfers   OT goals addressed during session: ADL's and self-care                       End of Session Equipment Utilized During Treatment: Rolling walker (2 wheels)  Activity Tolerance: Patient tolerated treatment well Patient left: in chair;with call bell/phone within reach;with chair alarm set  OT Visit Diagnosis: Unsteadiness on feet (R26.81);Other abnormalities of gait and mobility (R26.89);Muscle weakness (generalized) (M62.81)                Time: 2130-8657 OT Time Calculation (min): 15 min Charges:  OT General Charges $OT Visit: 1 Visit OT Evaluation $OT Eval Low Complexity: 1 Low  Lai Hendriks  Hasna Stefanik OT, MOT  Danie Chandler 08/08/2023, 10:51 AM

## 2023-08-08 NOTE — Progress Notes (Signed)
  Progress Note   Patient: Elizabeth Cherry [id number]:[id number] DOB: 09/22/1955 DOA: 08/07/2023     1 DOS: the patient was seen and examined on 08/08/2023   Brief hospital course: 68 y.o. female with medical history significant of COPD, asthma, right breast cancer, fibromyalgia, GERD, tobacco dependence, presenting with severe respiratory distress and worsening shortness of breath.  Per reports from EMS, patient has had worsening shortness of breath and wheezing for a week.  Showed up to doctor's office with noted O2 sats in the 60s.  Subsequently placed on 6 L nasal cannula with improvement in O2.  Patient had admitted that she had an illness initially with nasal congestion and sore throat that progressed to worsening shortness of breath.   Upon evaluation emergency department, patient is somewhat obtunded but minimally rousable.  Patient minimally able to answer questions.  Denies pain but admits to coughing and shortness of breath.  Otherwise review of systems unable to be obtained.  Currently on BiPAP.  ABG noting 7.2 9/95/32.  WBCs 19.6.  Assessment and Plan:  Acute metabolic encephalopathy - Likely secondary to hypercapnia.  Showing improvement this morning.  Will continue to monitor closely.   Acute on chronic hypoxic and hypercapnic respiratory failure - On presentation ABG 7.29/95/32.  Subsequently placed on BiPAP.  Recheck ABG this morning 7.4 5/60/70.  Showing marked improvement.  Will attempt to wean off of BiPAP.    Acute COPD exacerbation - 1 week of worsening shortness of breath with history of underlying COPD.  Initiated on IV Solu-Medrol 40 mg every 12 hours.  Scheduled DuoNebs.  Negative COVID/flu/RSV.  Sputum cultures, urine antigens.  Chest x-ray personally reviewed showing no frank consolidations however some mild haziness, cannot rule out atypical or viral infection.  Placed on empiric Levaquin (day 2).   Concern for sepsis - Dyspnea, tachypnea, leukocytosis, on presentation give  concern for sepsis.  Blood cultures, IV fluid hydration, empiric antibiotics initiated.  Monitor blood pressure and blood cultures closely.   Tobacco dependence - Encourage cessation.  Nicotine patch as needed.   Hypertension - Likely exacerbated by respiratory failure.  Hydralazine IV as needed.   Neurolyse anxiety disorder - Resume home Xanax.       Subjective: Patient resting comfortably this morning, continues on BiPAP.  Showing improvement.  No acute events overnight.  Physical Exam: Vitals:   08/08/23 0802 08/08/23 0900 08/08/23 1000 08/08/23 1132  BP:   (!) 154/92   Pulse:  (!) 103 (!) 102   Resp:  (!) 23    Temp: 98.5 F (36.9 C)   97.9 F (36.6 C)  TempSrc: Axillary   Oral  SpO2:  95% 96%   Weight:      Height:       GENERAL: Lethargic, resting comfortably HEENT:  EOMI, BiPAP mask CARDIOVASCULAR:  RRR, no murmurs appreciated RESPIRATORY: Poor air movement bilaterally GASTROINTESTINAL:  Soft, nontender, nondistended EXTREMITIES: Thin, no LE edema bilaterally NEURO:  No new focal deficits appreciated SKIN: Scattered ecchymosis  PSYCH:  Appropriate mood and affect   Data Reviewed:  There are no new results to review at this time.  Family Communication: None  Disposition: Status is: Inpatient Remains inpatient appropriate because: Acute hypoxic respiratory failure  Planned Discharge Destination: Home    Time spent: 42 minutes  Author: Deanna Artis, DO 08/08/2023 11:36 AM  For on call review [email or web address].

## 2023-08-09 LAB — CBC
HCT: 40.3 % (ref 36.0–46.0)
Hemoglobin: 11.8 g/dL — ABNORMAL LOW (ref 12.0–15.0)
MCH: 23.7 pg — ABNORMAL LOW (ref 26.0–34.0)
MCHC: 29.3 g/dL — ABNORMAL LOW (ref 30.0–36.0)
MCV: 81.1 fL (ref 80.0–100.0)
Platelets: 352 10*3/uL (ref 150–400)
RBC: 4.97 MIL/uL (ref 3.87–5.11)
RDW: 16.4 % — ABNORMAL HIGH (ref 11.5–15.5)
WBC: 11.7 10*3/uL — ABNORMAL HIGH (ref 4.0–10.5)
nRBC: 0 % (ref 0.0–0.2)

## 2023-08-09 LAB — BASIC METABOLIC PANEL
Anion gap: 7 (ref 5–15)
BUN: 20 mg/dL (ref 8–23)
CO2: 39 mmol/L — ABNORMAL HIGH (ref 22–32)
Calcium: 8.8 mg/dL — ABNORMAL LOW (ref 8.9–10.3)
Chloride: 91 mmol/L — ABNORMAL LOW (ref 98–111)
Creatinine, Ser: 0.59 mg/dL (ref 0.44–1.00)
GFR, Estimated: >60 mL/min (ref >60)
Glucose, Bld: 125 mg/dL — ABNORMAL HIGH (ref 70–99)
Potassium: 3.8 mmol/L (ref 3.5–5.1)
Sodium: 137 mmol/L (ref 135–145)

## 2023-08-09 LAB — MAGNESIUM: Magnesium: 2.1 mg/dL (ref 1.7–2.4)

## 2023-08-09 MED ORDER — ACETAZOLAMIDE ER 500 MG PO CP12
500.0000 mg | ORAL_CAPSULE | Freq: Two times a day (BID) | ORAL | Status: DC
  Administered 2023-08-09 – 2023-08-20 (×21): 500 mg via ORAL
  Filled 2023-08-09 (×26): qty 1

## 2023-08-09 MED ORDER — TRAMADOL HCL 50 MG PO TABS
50.0000 mg | ORAL_TABLET | Freq: Two times a day (BID) | ORAL | Status: DC
  Administered 2023-08-09 – 2023-08-20 (×20): 50 mg via ORAL
  Filled 2023-08-09 (×21): qty 1

## 2023-08-09 MED ORDER — BUDESON-GLYCOPYRROL-FORMOTEROL 160-9-4.8 MCG/ACT IN AERO
1.0000 | INHALATION_SPRAY | Freq: Two times a day (BID) | RESPIRATORY_TRACT | Status: DC
  Administered 2023-08-10: 1 via RESPIRATORY_TRACT
  Filled 2023-08-09: qty 5.9

## 2023-08-09 MED ORDER — FLUCONAZOLE 100 MG PO TABS
100.0000 mg | ORAL_TABLET | Freq: Every day | ORAL | Status: DC
  Administered 2023-08-09 – 2023-08-20 (×12): 100 mg via ORAL
  Filled 2023-08-09 (×12): qty 1

## 2023-08-09 NOTE — Plan of Care (Signed)

## 2023-08-09 NOTE — Progress Notes (Signed)
 Progress Note   Patient: Elizabeth Cherry [id number] DOB: January 02, 1956 DOA: 08/07/2023     2 DOS: the patient was seen and examined on 08/09/2023   Brief hospital course: 68 y.o. female with medical history significant of COPD, asthma, right breast cancer, fibromyalgia, GERD, tobacco dependence, presenting with severe respiratory distress and worsening shortness of breath.  Per reports from EMS, patient has had worsening shortness of breath and wheezing for a week.  Showed up to doctor's office with noted O2 sats in the 60s.  Subsequently placed on 6 L nasal cannula with improvement in O2.  Patient had admitted that she had an illness initially with nasal congestion and sore throat that progressed to worsening shortness of breath.   Upon evaluation emergency department, patient is somewhat obtunded but minimally rousable.  Patient minimally able to answer questions.  Denies pain but admits to coughing and shortness of breath.  Otherwise review of systems unable to be obtained.  Currently on BiPAP.  ABG noting 7.2 9/95/32.  WBCs 19.6.  Assessment and Plan:  Acute metabolic encephalopathy - Likely secondary to hypercapnia.  Showing improvement this morning.  Appears resolved  Acute on chronic hypoxic and hypercapnic respiratory failure - On presentation ABG 7.29/95/32.  Initially placed on BiPAP.  Able to be weaned off BiPAP yesterday evening.  Currently on 4 L nasal cannula.  Will attempt to wean O2 as tolerated.  Given improvement in respiratory status, will transition patient out of ICU status.  Acute COPD exacerbation - 1 week of worsening shortness of breath with history of underlying COPD.  Responding well to IV Solu-Medrol 40 mg every 12 hours.  Leukocytosis improving.  Scheduled DuoNebs.  Negative COVID/flu/RSV.  Pending sputum cultures, urine antigens.  Continue empiric Levaquin (day 3).  Will recheck CBC in AM.   Concern for sepsis - Dyspnea, tachypnea, leukocytosis, on presentation  give concern for sepsis.  Blood cultures, IV fluid hydration, empiric antibiotics initiated.  Monitor blood pressure and blood cultures closely.  Leukocytosis improving.   Tobacco dependence - Encourage cessation.  Nicotine patch as needed.   Hypertension - Likely exacerbated by respiratory failure.  Hydralazine IV as needed.   Neurolyse anxiety disorder - Resume home Xanax.  Physical debilitation and muscle weakness - Patient frail and cachectic.  Evaluated by PT.  Would likely benefit from STR however patient does not want to pursue rehab.  Will ultimately be discharged home with home health.      Subjective: Patient sitting up in the bed this morning, states she is feeling improved.  Currently on 4 L nasal cannula.  Has mild cough but denies fevers, purulent sputum, chest pain, nausea, vomiting, abdominal pain.  Physical Exam: Vitals:   08/09/23 0500 08/09/23 0600 08/09/23 0700 08/09/23 1143  BP: 129/70 120/60 134/77   Pulse: (!) 105 (!) 105 100   Resp: 16 18 20    Temp:    98.1 F (36.7 C)  TempSrc:    Oral  SpO2: 98% 100% 96%   Weight:  61.7 kg    Height:       GENERAL: Alert, pleasant, disheveled HEENT:  EOMI, nasal cannula CARDIOVASCULAR:  RRR, no murmurs appreciated RESPIRATORY: Poor air movement bilaterally GASTROINTESTINAL:  Soft, nontender, nondistended EXTREMITIES: Thin, noted kyphosis, no LE edema bilaterally NEURO:  No new focal deficits appreciated SKIN: Scattered ecchymosis  PSYCH:  Appropriate mood and affect   Data Reviewed:  There are no new results to review at this time.  Family Communication: None  Disposition: Status  is: Inpatient Remains inpatient appropriate because: Acute hypoxic respiratory failure  Planned Discharge Destination: Home    Time spent: 36 minutes  Author: Deanna Artis, DO 08/09/2023 2:13 PM  For on call review [email or web address].

## 2023-08-10 LAB — COMPREHENSIVE METABOLIC PANEL
ALT: 19 U/L (ref 0–44)
AST: 16 U/L (ref 15–41)
Albumin: 3.2 g/dL — ABNORMAL LOW (ref 3.5–5.0)
Alkaline Phosphatase: 78 U/L (ref 38–126)
Anion gap: 10 (ref 5–15)
BUN: 21 mg/dL (ref 8–23)
CO2: 37 mmol/L — ABNORMAL HIGH (ref 22–32)
Calcium: 9.1 mg/dL (ref 8.9–10.3)
Chloride: 90 mmol/L — ABNORMAL LOW (ref 98–111)
Creatinine, Ser: 0.6 mg/dL (ref 0.44–1.00)
GFR, Estimated: >60 mL/min (ref >60)
Glucose, Bld: 100 mg/dL — ABNORMAL HIGH (ref 70–99)
Potassium: 4 mmol/L (ref 3.5–5.1)
Sodium: 137 mmol/L (ref 135–145)
Total Bilirubin: 0.6 mg/dL (ref 0.0–1.2)
Total Protein: 6.5 g/dL (ref 6.5–8.1)

## 2023-08-10 LAB — CBC
HCT: 46.8 % — ABNORMAL HIGH (ref 36.0–46.0)
Hemoglobin: 12.9 g/dL (ref 12.0–15.0)
MCH: 23.6 pg — ABNORMAL LOW (ref 26.0–34.0)
MCHC: 27.6 g/dL — ABNORMAL LOW (ref 30.0–36.0)
MCV: 85.7 fL (ref 80.0–100.0)
Platelets: 315 10*3/uL (ref 150–400)
RBC: 5.46 MIL/uL — ABNORMAL HIGH (ref 3.87–5.11)
RDW: 16.3 % — ABNORMAL HIGH (ref 11.5–15.5)
WBC: 9.4 10*3/uL (ref 4.0–10.5)
nRBC: 0 % (ref 0.0–0.2)

## 2023-08-10 LAB — MAGNESIUM: Magnesium: 2.2 mg/dL (ref 1.7–2.4)

## 2023-08-10 MED ORDER — LEVOFLOXACIN 750 MG PO TABS
750.0000 mg | ORAL_TABLET | Freq: Every day | ORAL | Status: AC
  Administered 2023-08-10 – 2023-08-11 (×2): 750 mg via ORAL
  Filled 2023-08-10 (×2): qty 1

## 2023-08-10 NOTE — Progress Notes (Signed)
 Progress Note   Patient: Elizabeth Cherry [id number] DOB: [birth year] DOA: 08/07/2023     3 DOS: the patient was seen and examined on 08/10/2023   Brief hospital course: 68 y.o. female with medical history significant of COPD, asthma, right breast cancer, fibromyalgia, GERD, tobacco dependence, presenting with severe respiratory distress and worsening shortness of breath.  Per reports from EMS, patient has had worsening shortness of breath and wheezing for a week.  Showed up to doctor's office with noted O2 sats in the 60s.  Subsequently placed on 6 L nasal cannula with improvement in O2.  Patient had admitted that she had an illness initially with nasal congestion and sore throat that progressed to worsening shortness of breath.   Upon evaluation emergency department, patient is somewhat obtunded but minimally rousable.  Patient minimally able to answer questions.  Denies pain but admits to coughing and shortness of breath.  Otherwise review of systems unable to be obtained.  Currently on BiPAP.  ABG noting 7.2 9/95/32.  WBCs 19.6.  Assessment and Plan:  Acute metabolic encephalopathy - Likely secondary to hypercapnia.  Appears resolved  Acute on chronic hypoxic and hypercapnic respiratory failure - On presentation ABG 7.29/95/32.  Initially placed on BiPAP.  Able to be weaned off BiPAP yesterday evening.  Currently weaned down to 3 L nasal cannula.  Currently some confusion about patient's baseline O2.  Patient herself cannot tell me.  Awaiting discussion with patient's friend/neighbor about O2 baseline.  Will attempt to wean O2 as tolerated.    Acute COPD exacerbation - 1 week of worsening shortness of breath with history of underlying COPD.  Responding well to IV Solu-Medrol 40 mg every 12 hours.  Leukocytosis improving.  Scheduled DuoNebs.  Negative COVID/flu/RSV.  Continue empiric Levaquin (day 4 of 5-7).  Will recheck CBC in AM.   Concern for sepsis - Dyspnea, tachypnea, leukocytosis,  on presentation give concern for sepsis.  Blood cultures, IV fluid hydration, empiric antibiotics initiated.  Monitor blood pressure and blood cultures closely.  Leukocytosis improving.  Appears to be resolving.   Tobacco dependence - Encourage cessation.  Nicotine patch as needed.   Hypertension - Likely exacerbated by respiratory failure.  Hydralazine IV as needed.   Neurolyse anxiety disorder - Resume home Xanax.  Physical debilitation and muscle weakness - Patient frail and cachectic.  Evaluated by PT.  Would likely benefit from STR however patient does not want to pursue rehab.  Will ultimately be discharged home with home health.      Subjective: Patient sitting up in bed however slumped over, kyphotic.  States she is comfortable in that position.  Currently weaned to 3 L nasal cannula.  Patient is unclear of her baseline.  Denies fevers, purulent sputum, chest pain, nausea, vomiting, abdominal pain.  Physical Exam: Vitals:   08/10/23 0800 08/10/23 0901 08/10/23 0908 08/10/23 1106  BP: (!) 154/71     Pulse:      Resp: (!) 29     Temp:    98.4 F (36.9 C)  TempSrc:    Oral  SpO2:  95% 97%   Weight:      Height:       GENERAL: Alert, pleasant, disheveled HEENT:  EOMI, nasal cannula CARDIOVASCULAR:  RRR, no murmurs appreciated RESPIRATORY: Poor air movement bilaterally GASTROINTESTINAL:  Soft, nontender, nondistended EXTREMITIES: Thin, noted kyphosis, no LE edema bilaterally NEURO:  No new focal deficits appreciated SKIN: Scattered ecchymosis  PSYCH:  Appropriate mood and affect   Data Reviewed:  There are no new results to review at this time.  Family Communication: None  Disposition: Status is: Inpatient Remains inpatient appropriate because: Acute hypoxic respiratory failure  Planned Discharge Destination: Home    Time spent: 33 minutes  Author: Deanna Artis, DO 08/10/2023 11:16 AM  For on call review [email or web address].

## 2023-08-10 NOTE — Progress Notes (Signed)
 Has dry cough, I asked if she would like me to get an order for cough medicine but she refused.

## 2023-08-10 NOTE — Plan of Care (Signed)

## 2023-08-10 NOTE — Progress Notes (Signed)
 PHARMACIST - PHYSICIAN COMMUNICATION CONCERNING: Antibiotic IV to Oral Route Change Policy  RECOMMENDATION: This patient is receiving levofloxacin intravenously. Based on criteria approved by the Pharmacy and Therapeutics Committee, the antibiotic(s) is/are being converted to the equivalent dose of an oral formulation.   DESCRIPTION: These criteria include: Patient being treated for a respiratory tract infection, urinary tract infection, cellulitis or Clostridioides difficile-associated diarrhea if on metronidazole. The patient is not neutropenic and does not exhibit a malabsorptive GI state. The patient is eating (either orally or via tube) and/or has been taking other orally administered medications for at least 24 hours. The patient is improving clinically and has a 24-hour Tmax of <100.5 F.  If you have questions about this conversion, please contact the Pharmacy Department:  []   [phone] )  [facility] [x]   [phone] )  [facility] []   [phone] )  [facility]  []   [phone] )  [facility]   Will M. Dareen Piano, PharmD Clinical Pharmacist 08/10/2023 12:21 PM

## 2023-08-11 LAB — BASIC METABOLIC PANEL
Anion gap: 12 (ref 5–15)
BUN: 24 mg/dL — ABNORMAL HIGH (ref 8–23)
CO2: 32 mmol/L (ref 22–32)
Calcium: 9.1 mg/dL (ref 8.9–10.3)
Chloride: 94 mmol/L — ABNORMAL LOW (ref 98–111)
Creatinine, Ser: 0.71 mg/dL (ref 0.44–1.00)
GFR, Estimated: >60 mL/min (ref >60)
Glucose, Bld: 93 mg/dL (ref 70–99)
Potassium: 3.8 mmol/L (ref 3.5–5.1)
Sodium: 138 mmol/L (ref 135–145)

## 2023-08-11 LAB — CBC
HCT: 48.4 % — ABNORMAL HIGH (ref 36.0–46.0)
Hemoglobin: 13.4 g/dL (ref 12.0–15.0)
MCH: 23.6 pg — ABNORMAL LOW (ref 26.0–34.0)
MCHC: 27.7 g/dL — ABNORMAL LOW (ref 30.0–36.0)
MCV: 85.4 fL (ref 80.0–100.0)
Platelets: 243 10*3/uL (ref 150–400)
RBC: 5.67 MIL/uL — ABNORMAL HIGH (ref 3.87–5.11)
RDW: 16.6 % — ABNORMAL HIGH (ref 11.5–15.5)
WBC: 10.1 10*3/uL (ref 4.0–10.5)
nRBC: 0 % (ref 0.0–0.2)

## 2023-08-11 MED ORDER — BUDESON-GLYCOPYRROL-FORMOTEROL 160-9-4.8 MCG/ACT IN AERO: 1.0000 | INHALATION_SPRAY | Freq: Two times a day (BID) | RESPIRATORY_TRACT | Status: DC

## 2023-08-11 MED ORDER — MOMETASONE FURO-FORMOTEROL FUM 100-5 MCG/ACT IN AERO: 2.0000 | INHALATION_SPRAY | Freq: Two times a day (BID) | RESPIRATORY_TRACT | Status: DC

## 2023-08-11 MED ORDER — UMECLIDINIUM BROMIDE 62.5 MCG/ACT IN AEPB: 1.0000 | INHALATION_SPRAY | Freq: Every day | RESPIRATORY_TRACT | Status: DC

## 2023-08-11 MED ORDER — IPRATROPIUM-ALBUTEROL 0.5-2.5 (3) MG/3ML IN SOLN
3.0000 mL | Freq: Four times a day (QID) | RESPIRATORY_TRACT | Status: DC | PRN
  Administered 2023-08-13 – 2023-08-17 (×3): 3 mL via RESPIRATORY_TRACT
  Filled 2023-08-11 (×4): qty 3

## 2023-08-11 NOTE — Progress Notes (Signed)
 Patient removed nasal cannula, checked vitals oxygen saturation 88% on 2 liters increased patient to 3 liters. Oxygen saturation staying above 90% on 3 liters. Noted patient has order for continues pulse oximetry. MD Thomes Dinning made aware. New order placed for tele and monitor oxygen every 4 hours.

## 2023-08-11 NOTE — Progress Notes (Signed)
 Patient transferred to room 330 via wheelchair. Patient HCPOA notified. No questions or concerns raised from patient or receiving nurse. Pt did not eat breakfast. Encouraged to eat lunch and participate in PT sessions.

## 2023-08-11 NOTE — Progress Notes (Signed)
 Physical Therapy Treatment Patient Details Name: Elizabeth Cherry MRN: [id number] DOB: 27-Apr-1956 Today's Date: 08/11/2023   History of Present Illness Elizabeth Cherry is a 68 y.o. female with medical history significant of COPD, asthma, right breast cancer, fibromyalgia, GERD, tobacco dependence, presenting with severe respiratory distress and worsening shortness of breath.  Per reports from EMS, patient has had worsening shortness of breath and wheezing for a week.  Showed up to doctor's office with noted O2 sats in the 60s.  Subsequently placed on 6 L nasal cannula with improvement in O2.  Patient had admitted that she had an illness initially with nasal congestion and sore throat that progressed to worsening shortness of breath.    PT Comments  Patient presents seated at EOB, and agreeable for bedside activities only due to c/o fatigue.  Patient SpO2 drooped from 96% to 85% while on room air while completing BLE exercises, tolerated standing a couple of time using RW, but declined to attempt gait training due to c/o faitge and generalized weakness.  Patient continued sitting up at bedside after therapy. Patient will benefit from continued skilled physical therapy in hospital and recommended venue below to increase strength, balance, endurance for safe ADLs and gait.    If plan is discharge home, recommend the following: A little help with walking and/or transfers;A little help with bathing/dressing/bathroom;Help with stairs or ramp for entrance;Assistance with cooking/housework   Can travel by private vehicle        Equipment Recommendations  None recommended by PT    Recommendations for Other Services       Precautions / Restrictions Precautions Precautions: Fall Recall of Precautions/Restrictions: Impaired     Mobility  Bed Mobility Overal bed mobility: Needs Assistance Bed Mobility: Supine to Sit, Sit to Supine     Supine to sit: Supervision Sit to supine: Supervision    General bed mobility comments: fair/good return for bed mobility, scooting laterallay    Transfers Overall transfer level: Needs assistance Equipment used: Rolling walker (2 wheels) Transfers: Sit to/from Stand Sit to Stand: Contact guard assist, Min assist           General transfer comment: patient stood with RW for up to 30-40 seconds before requesting to sit due to fatigue    Ambulation/Gait                   Stairs             Wheelchair Mobility     Tilt Bed    Modified Rankin (Stroke Patients Only)       Balance Overall balance assessment: Needs assistance Sitting-balance support: Feet unsupported, Feet supported Sitting balance-Leahy Scale: Good Sitting balance - Comments: good sitting up at bedside and sitting with legs crossed   Standing balance support: During functional activity, Bilateral upper extremity supported Standing balance-Leahy Scale: Fair Standing balance comment: standing with RW                            Communication Communication Communication: No apparent difficulties  Cognition Arousal: Alert Behavior During Therapy: WFL for tasks assessed/performed   PT - Cognitive impairments: No apparent impairments                         Following commands: Intact      Cueing Cueing Techniques: Verbal cues, Tactile cues  Exercises General Exercises - Lower Extremity Ankle Circles/Pumps: Seated, AROM, Strengthening,  Both, 10 reps Long Arc Quad: Seated, AROM, Strengthening, Both, 10 reps    General Comments        Pertinent Vitals/Pain Pain Assessment Pain Assessment: No/denies pain    Home Living                          Prior Function            PT Goals (current goals can now be found in the care plan section) Acute Rehab PT Goals Patient Stated Goal: return home with family to assist PT Goal Formulation: With patient Time For Goal Achievement: 08/15/23 Potential to  Achieve Goals: Good Progress towards PT goals: Progressing toward goals    Frequency    Min 3X/week      PT Plan      Co-evaluation              AM-PAC PT "6 Clicks" Mobility   Outcome Measure  Help needed turning from your back to your side while in a flat bed without using bedrails?: A Little Help needed moving from lying on your back to sitting on the side of a flat bed without using bedrails?: A Little Help needed moving to and from a bed to a chair (including a wheelchair)?: A Little Help needed standing up from a chair using your arms (e.g., wheelchair or bedside chair)?: A Little Help needed to walk in hospital room?: A Lot Help needed climbing 3-5 steps with a railing? : A Lot 6 Click Score: 16    End of Session Equipment Utilized During Treatment: Oxygen Activity Tolerance: Patient tolerated treatment well;Patient limited by fatigue Patient left: in bed;with call bell/phone within reach;with bed alarm set Nurse Communication: Mobility status PT Visit Diagnosis: Unsteadiness on feet (R26.81);Other abnormalities of gait and mobility (R26.89);Muscle weakness (generalized) (M62.81)     Time: 1610-9604 PT Time Calculation (min) (ACUTE ONLY): 20 min  Charges:    $Therapeutic Activity: 8-22 mins PT General Charges $$ ACUTE PT VISIT: 1 Visit                     2:41 PM, 08/11/23 Ocie Bob, MPT Physical Therapist with [facility] 336 [phone] office [phone] mobile phone

## 2023-08-11 NOTE — Progress Notes (Signed)
 Progress Note   Patient: Elizabeth Cherry ZOX:[id number] DOB: [birth year] DOA: 08/07/2023     4 DOS: the patient was seen and examined on 08/11/2023   Brief hospital course: 68 y.o. female with medical history significant of COPD, asthma, right breast cancer, fibromyalgia, GERD, tobacco dependence, presenting with severe respiratory distress and worsening shortness of breath.  Per reports from EMS, patient has had worsening shortness of breath and wheezing for a week.  Showed up to doctor's office with noted O2 sats in the 60s.  Subsequently placed on 6 L nasal cannula with improvement in O2.  Patient had admitted that she had an illness initially with nasal congestion and sore throat that progressed to worsening shortness of breath.   Upon evaluation emergency department, patient is somewhat obtunded but minimally rousable.  Patient minimally able to answer questions.  Denies pain but admits to coughing and shortness of breath.  Otherwise review of systems unable to be obtained.  Currently on BiPAP.  ABG noting 7.2 9/95/32.  WBCs 19.6.  Assessment and Plan:  Acute metabolic encephalopathy - Likely secondary to hypercapnia.  Appears resolved  Acute on chronic hypoxic and hypercapnic respiratory failure - On presentation ABG 7.29/95/32.  Initially placed on BiPAP.  Able to be weaned off BiPAP yesterday evening.  Currently weaned down to 1-2 L nasal cannula.  Patient does not use supplemental O2 at baseline.  Will attempt to wean O2 as tolerated.    Acute COPD exacerbation - 1 week of worsening shortness of breath with history of underlying COPD.  Responding well to IV Solu-Medrol, scheduled nebulizers.  Leukocytosis improving.  Will DC Solu-Medrol.  Continue empiric Levaquin (day 5 of 5-7).  Will recheck CBC in AM.   Concern for sepsis - Dyspnea, tachypnea, leukocytosis, possible pneumonia, on presentation give concern for sepsis.  Blood cultures, IV fluid hydration, empiric antibiotics  initiated.  Monitor blood pressure and blood cultures closely.  Leukocytosis improving.  Appears to be resolved.   Tobacco dependence - Encourage cessation.  Nicotine patch as needed.   Hypertension - Likely exacerbated by respiratory failure.  Hydralazine IV as needed.   Neurolyse anxiety disorder - Resume home Xanax.  Physical debilitation and muscle weakness - Patient frail and cachectic.  Evaluated by PT.  Would likely benefit from STR however patient does not want to pursue rehab.  Will ultimately be discharged home with home health.      Subjective: Patient sitting up in bed however slumped over, kyphotic.  States she is comfortable in that position.  Currently weaned to 1L nasal cannula.  Appears much more alert and talkative this morning.  Denies fevers, purulent sputum, chest pain, nausea, vomiting, abdominal pain.  Was also able to call her HCPOA and update her on her hospital visit and disposition planning.  She was appreciative of the update.  Physical Exam: Vitals:   08/11/23 0600 08/11/23 0700 08/11/23 0800 08/11/23 0804  BP: 118/61 130/72 129/79   Pulse: (!) 101  (!) 103   Resp: 18 16 16    Temp:    98.2 F (36.8 C)  TempSrc:    Axillary  SpO2: 98%  94%   Weight:      Height:       GENERAL: Alert, pleasant, disheveled HEENT:  EOMI, nasal cannula CARDIOVASCULAR:  RRR, no murmurs appreciated RESPIRATORY: Poor air movement bilaterally, trace expiratory wheezing GASTROINTESTINAL:  Soft, nontender, nondistended EXTREMITIES: Thin, noted kyphosis, no LE edema bilaterally NEURO:  No new focal deficits appreciated SKIN: Scattered ecchymosis  PSYCH:  Appropriate mood and affect   Data Reviewed:  There are no new results to review at this time.  Family Communication: None  Disposition: Status is: Inpatient Remains inpatient appropriate because: Acute hypoxic respiratory failure  Planned Discharge Destination: Home    Time spent: 35 minutes  Author: Deanna Artis, DO 08/11/2023 11:16 AM  For on call review [email or web address].

## 2023-08-12 LAB — BASIC METABOLIC PANEL
Anion gap: 8 (ref 5–15)
BUN: 24 mg/dL — ABNORMAL HIGH (ref 8–23)
CO2: 33 mmol/L — ABNORMAL HIGH (ref 22–32)
Calcium: 8.9 mg/dL (ref 8.9–10.3)
Chloride: 98 mmol/L (ref 98–111)
Creatinine, Ser: 0.53 mg/dL (ref 0.44–1.00)
GFR, Estimated: >60 mL/min (ref >60)
Glucose, Bld: 113 mg/dL — ABNORMAL HIGH (ref 70–99)
Potassium: 3.2 mmol/L — ABNORMAL LOW (ref 3.5–5.1)
Sodium: 139 mmol/L (ref 135–145)

## 2023-08-12 LAB — CULTURE, BLOOD (ROUTINE X 2)
Culture: NO GROWTH
Culture: NO GROWTH

## 2023-08-12 LAB — CBC
HCT: 47.2 % — ABNORMAL HIGH (ref 36.0–46.0)
Hemoglobin: 13.2 g/dL (ref 12.0–15.0)
MCH: 23.4 pg — ABNORMAL LOW (ref 26.0–34.0)
MCHC: 28 g/dL — ABNORMAL LOW (ref 30.0–36.0)
MCV: 83.7 fL (ref 80.0–100.0)
Platelets: 255 10*3/uL (ref 150–400)
RBC: 5.64 MIL/uL — ABNORMAL HIGH (ref 3.87–5.11)
RDW: 16.1 % — ABNORMAL HIGH (ref 11.5–15.5)
WBC: 9 10*3/uL (ref 4.0–10.5)
nRBC: 0 % (ref 0.0–0.2)

## 2023-08-12 LAB — MAGNESIUM: Magnesium: 2 mg/dL (ref 1.7–2.4)

## 2023-08-12 MED ORDER — POTASSIUM CHLORIDE 20 MEQ PO PACK
40.0000 meq | PACK | Freq: Two times a day (BID) | ORAL | Status: AC
  Administered 2023-08-12 (×2): 40 meq via ORAL
  Filled 2023-08-12 (×2): qty 2

## 2023-08-12 NOTE — Plan of Care (Signed)
  Problem: Education: Goal: Knowledge of General Education information will improve Description: Including pain rating scale, medication(s)/side effects and non-pharmacologic comfort measures Outcome: Progressing   Problem: Clinical Measurements: Goal: Ability to maintain clinical measurements within normal limits will improve Outcome: Progressing   Problem: Pain Managment: Goal: General experience of comfort will improve and/or be controlled Outcome: Progressing   Problem: Skin Integrity: Goal: Risk for impaired skin integrity will decrease Outcome: Progressing   Problem: Education: Goal: Knowledge of disease or condition will improve Outcome: Progressing

## 2023-08-12 NOTE — Progress Notes (Addendum)
 Tele called patients heart rate got up to 137, then tele leads were removed. Went to assess patient, obtained vital signs: T-97.4, BP-135/71, P-107, R-20, O2-95% at 3 liters. Noted patient having some abdominal breathing but patient sleeping with no complaints. MD Thomes Dinning made aware. Obtained EKG at 0207, placed in patients chart and MD aware.

## 2023-08-12 NOTE — Plan of Care (Signed)

## 2023-08-12 NOTE — Progress Notes (Signed)
 Restless and confused.  On 3 liters and sats are in low 90's . Continues to take O2 canula out even though secured with mepilex. Makes statements such as "Morrie Sheldon took my oxygen off" even though Morrie Sheldon is not here.  Stated that the machine that makes the oxygen is going to start using the oxygen and take is from her.  Caregivers say that she has not been eating for several weeks and is not eating her meals here.  Did ask for gingerale and drank a few sips.

## 2023-08-12 NOTE — Progress Notes (Signed)
 Progress Note   Patient: Elizabeth Cherry ZOX:[id number] DOB: [birth year] DOA: 08/07/2023     5 DOS: the patient was seen and examined on 08/12/2023   Brief hospital course: 68 y.o. female with medical history significant of COPD, asthma, right breast cancer, fibromyalgia, GERD, tobacco dependence, presenting with severe respiratory distress and worsening shortness of breath.  Per reports from EMS, patient has had worsening shortness of breath and wheezing for a week.  Showed up to doctor's office with noted O2 sats in the 60s.  Subsequently placed on 6 L nasal cannula with improvement in O2.  Patient had admitted that she had an illness initially with nasal congestion and sore throat that progressed to worsening shortness of breath.   Upon evaluation emergency department, patient is somewhat obtunded but minimally rousable.  Patient minimally able to answer questions.  Denies pain but admits to coughing and shortness of breath.  Otherwise review of systems unable to be obtained.  Currently on BiPAP.  ABG noting 7.2 9/95/32.  WBCs 19.6.  Assessment and Plan:  Acute metabolic encephalopathy - Likely secondary to hypercapnia.  Appears resolved  Acute on chronic hypoxic and hypercapnic respiratory failure - On presentation ABG 7.29/95/32.  Initially placed on BiPAP.  Able to be weaned off BiPAP yesterday evening.  Was able to be weaned down to 2-3 L nasal cannula.  Continue to wean O2 as tolerated.  Patient does not use supplemental O2 at baseline.  Acute COPD exacerbation - 1 week of worsening shortness of breath with history of underlying COPD.  Responding well to IV Solu-Medrol, scheduled nebulizers.  Leukocytosis resolved.  Completed Solu-Medrol.  Completed 5 days empiric Levaquin.  Will recheck CBC in AM.   Concern for sepsis - Dyspnea, tachypnea, leukocytosis, possible pneumonia, on presentation give concern for sepsis.  Blood cultures, IV fluid hydration, empiric antibiotics initiated.   Monitor blood pressure and blood cultures closely.  Leukocytosis improving.  Appears to be resolved.   Tobacco dependence - Encourage cessation.  Nicotine patch as needed.   Hypertension - Likely exacerbated by respiratory failure.  Hydralazine IV as needed.   Generalized anxiety disorder - Resume home Xanax.  Physical debilitation and muscle weakness - Patient frail and cachectic.  Evaluated by PT.  Would likely benefit from STR however patient does not want to pursue rehab.  Will ultimately be discharged home with home health.  Goals of care - Patient is extremely frail, lives by herself.  Patient minimally interactive with PT and sits slumped over in bed with her head in her lap.  Refuses rehab however given her current condition, SNF is likely more suited.  Will continue to work to wean O2 and encourage patient to work with PT.  HCPOA updated yesterday 3/24.  She is aware of how stubborn the patient can be.      Subjective: Patient sitting up in bed however slumped over, kyphotic, again.  O2 slightly worse than yesterday, now at 3 L nasal cannula.  Appears much more alert and talkative this morning.  Reportedly refused to work with PT yesterday.  Encouraged her to work with PT today.  Patient states her appetite is okay.  States she feels okay and has no other complaints.  Denies fevers, purulent sputum, chest pain, nausea, vomiting, abdominal pain.  Physical Exam: Vitals:   08/12/23 0146 08/12/23 0330 08/12/23 0727 08/12/23 0817  BP: 135/71 128/82  105/73  Pulse: (!) 107 (!) 109  (!) 106  Resp: 20 15    Temp: Marland Kitchen)  97.4 F (36.3 C) (!) 97.5 F (36.4 C)    TempSrc: Oral     SpO2: 95% 95% 96%   Weight:      Height:       GENERAL: Alert, pleasant, disheveled HEENT:  EOMI, nasal cannula CARDIOVASCULAR:  RRR, no murmurs appreciated RESPIRATORY: Poor air movement bilaterally, trace expiratory wheezing GASTROINTESTINAL:  Soft, nontender, nondistended EXTREMITIES: Thin, noted  kyphosis, no LE edema bilaterally NEURO:  No new focal deficits appreciated SKIN: Scattered ecchymosis  PSYCH:  Appropriate mood and affect   Data Reviewed:  There are no new results to review at this time.  Family Communication: None  Disposition: Status is: Inpatient Remains inpatient appropriate because: Acute hypoxic respiratory failure  Planned Discharge Destination: Home    Time spent: 36 minutes  Author: Deanna Artis, DO 08/12/2023 11:31 AM  For on call review [email or web address].

## 2023-08-12 NOTE — Progress Notes (Signed)
 Tele called patients oxygen saturation dropped to 67%. Went to assess pt noted that nasal cannula needed to be adjusted and pt was sleeping on oxygen sensor. MD Thomes Dinning made aware.

## 2023-08-12 NOTE — Progress Notes (Signed)
 Physical Therapy Treatment Patient Details Name: Elizabeth Cherry MRN: [id number] DOB: 1955/09/04 Today's Date: 08/12/2023   History of Present Illness Elizabeth Cherry is a 68 y.o. female with medical history significant of COPD, asthma, right breast cancer, fibromyalgia, GERD, tobacco dependence, presenting with severe respiratory distress and worsening shortness of breath.  Per reports from EMS, patient has had worsening shortness of breath and wheezing for a week.  Showed up to doctor's office with noted O2 sats in the 60s.  Subsequently placed on 6 L nasal cannula with improvement in O2.  Patient had admitted that she had an illness initially with nasal congestion and sore throat that progressed to worsening shortness of breath.    PT Comments  Pt continues to demonstrate unexpected resistance to functional mobility and ambulation. Pt was able to complete 3 steps fwd/bwd with CGA and gait belt without RW today. Pt able to complete bed mobility and STS movement with independence and supervision. Pt still demonstrating decreased activity tolerance, decreased fatigue resistance, and decreased LE strength. Pt would continue to benefit from skilled acute PT for addressing above stated deficits for improved quality of live, return to higher level of function, and increased independence with community ambulation.   If plan is discharge home, recommend the following: A little help with walking and/or transfers;A little help with bathing/dressing/bathroom;Help with stairs or ramp for entrance;Assistance with cooking/housework   Can travel by private vehicle        Equipment Recommendations  None recommended by PT    Recommendations for Other Services       Precautions / Restrictions Precautions Precautions: Fall Recall of Precautions/Restrictions: Impaired Restrictions Weight Bearing Restrictions Per Provider Order: No     Mobility  Bed Mobility Overal bed mobility: Needs  Assistance Bed Mobility: Supine to Sit, Sit to Supine     Supine to sit: Supervision Sit to supine: Supervision   General bed mobility comments: fair/good return for bed mobility, scooting laterallay Patient Response: Anxious, Restless  Transfers Overall transfer level: Needs assistance Equipment used: Rolling walker (2 wheels) Transfers: Sit to/from Stand Sit to Stand: Contact guard assist, Min assist Stand pivot transfers: Contact guard assist Step pivot transfers: Contact guard assist       General transfer comment: patient stood with RW for up to 30-40 seconds before requesting to sit due to fatigue    Ambulation/Gait Ambulation/Gait assistance: Contact guard assist, Min assist Gait Distance (Feet): 5 Feet Assistive device: Rolling walker (2 wheels) Gait Pattern/deviations: Decreased step length - right, Decreased step length - left, Decreased stride length Gait velocity: slow     General Gait Details: limited to a few side steps without loss of balance mostly due to difficulty breathing and on    Stairs             Wheelchair Mobility     Tilt Bed Tilt Bed Patient Response: Anxious, Restless  Modified Rankin (Stroke Patients Only)       Balance Overall balance assessment: Needs assistance Sitting-balance support: Feet unsupported, Feet supported Sitting balance-Leahy Scale: Good Sitting balance - Comments: good sitting up at bedside and sitting with legs crossed Postural control: Right lateral lean Standing balance support: During functional activity, Bilateral upper extremity supported Standing balance-Leahy Scale: Fair Standing balance comment: standing with no AD                            Communication Communication Communication: No apparent difficulties  Cognition  Arousal: Alert Behavior During Therapy: WFL for tasks assessed/performed   PT - Cognitive impairments: No apparent impairments                          Following commands: Intact      Cueing Cueing Techniques: Verbal cues, Tactile cues  Exercises      General Comments        Pertinent Vitals/Pain Pain Assessment Pain Assessment: 0-10 Pain Score: 8  Pain Location: neck, C7 Pain Descriptors / Indicators: Aching Pain Intervention(s): Limited activity within patient's tolerance, Repositioned, Other (comment) (pt was educated for pustural awareness)    Home Living Family/patient expects to be discharged to:: Private residence Living Arrangements: Alone Available Help at Discharge: Family;Friend(s);Available PRN/intermittently Type of Home: House Home Access: Level entry         Home Equipment: Rolling Walker (2 wheels) Additional Comments: Pt reports she thinks she could get 24/7 assist if needed.    Prior Function            PT Goals (current goals can now be found in the care plan section) Acute Rehab PT Goals Patient Stated Goal: return home with family to assist PT Goal Formulation: With patient Time For Goal Achievement: 08/15/23 Potential to Achieve Goals: Good Progress towards PT goals: Progressing toward goals    Frequency    Min 3X/week      PT Plan      Co-evaluation   Reason for Co-Treatment: To address functional/ADL transfers PT goals addressed during session: Mobility/safety with mobility;Balance;Proper use of DME        AM-PAC PT "6 Clicks" Mobility   Outcome Measure  Help needed turning from your back to your side while in a flat bed without using bedrails?: A Little Help needed moving from lying on your back to sitting on the side of a flat bed without using bedrails?: A Little Help needed moving to and from a bed to a chair (including a wheelchair)?: A Little Help needed standing up from a chair using your arms (e.g., wheelchair or bedside chair)?: A Little Help needed to walk in hospital room?: A Lot Help needed climbing 3-5 steps with a railing? : A Lot 6 Click Score: 16     End of Session Equipment Utilized During Treatment: Oxygen;Gait belt Activity Tolerance: Patient tolerated treatment well;Patient limited by fatigue Patient left: in bed;with call bell/phone within reach;with bed alarm set Nurse Communication: Mobility status PT Visit Diagnosis: Unsteadiness on feet (R26.81);Other abnormalities of gait and mobility (R26.89);Muscle weakness (generalized) (M62.81)     Time: 1400-1420 PT Time Calculation (min) (ACUTE ONLY): 20 min  Charges:    $Therapeutic Activity: 8-22 mins PT General Charges $$ ACUTE PT VISIT: 1 Visit                     Luz Lex, PT, DPT [facility] Office: [phone] 3:45 PM, 08/12/23

## 2023-08-13 ENCOUNTER — Inpatient Hospital Stay (HOSPITAL_COMMUNITY)

## 2023-08-13 ENCOUNTER — Encounter (HOSPITAL_COMMUNITY): Payer: Self-pay | Admitting: Internal Medicine

## 2023-08-13 DIAGNOSIS — J9622 Acute and chronic respiratory failure with hypercapnia: Secondary | ICD-10-CM

## 2023-08-13 DIAGNOSIS — Z515 Encounter for palliative care: Secondary | ICD-10-CM

## 2023-08-13 DIAGNOSIS — J9601 Acute respiratory failure with hypoxia: Secondary | ICD-10-CM | POA: Diagnosis not present

## 2023-08-13 DIAGNOSIS — Z7189 Other specified counseling: Secondary | ICD-10-CM | POA: Diagnosis not present

## 2023-08-13 DIAGNOSIS — J9621 Acute and chronic respiratory failure with hypoxia: Secondary | ICD-10-CM

## 2023-08-13 LAB — BLOOD GAS, ARTERIAL
Acid-Base Excess: 4.8 mmol/L — ABNORMAL HIGH (ref 0.0–2.0)
Acid-Base Excess: 7.4 mmol/L — ABNORMAL HIGH (ref 0.0–2.0)
Bicarbonate: 35.6 mmol/L — ABNORMAL HIGH (ref 20.0–28.0)
Bicarbonate: 35.3 mmol/L — ABNORMAL HIGH (ref 20.0–28.0)
Drawn by: 22223
Drawn by: 37588
O2 Saturation: 98 %
O2 Saturation: 97.4 %
Patient temperature: 37
Patient temperature: 36.7
pCO2 arterial: 87 mmHg (ref 32–48)
pCO2 arterial: 63 mmHg — ABNORMAL HIGH (ref 32–48)
pH, Arterial: 7.22 — ABNORMAL LOW (ref 7.35–7.45)
pH, Arterial: 7.35 (ref 7.35–7.45)
pO2, Arterial: 101 mmHg (ref 83–108)
pO2, Arterial: 69 mmHg — ABNORMAL LOW (ref 83–108)

## 2023-08-13 LAB — BASIC METABOLIC PANEL
Anion gap: 12 (ref 5–15)
BUN: 29 mg/dL — ABNORMAL HIGH (ref 8–23)
CO2: 31 mmol/L (ref 22–32)
Calcium: 9.1 mg/dL (ref 8.9–10.3)
Chloride: 98 mmol/L (ref 98–111)
Creatinine, Ser: 0.62 mg/dL (ref 0.44–1.00)
GFR, Estimated: >60 mL/min (ref >60)
Glucose, Bld: 97 mg/dL (ref 70–99)
Potassium: 4 mmol/L (ref 3.5–5.1)
Sodium: 141 mmol/L (ref 135–145)

## 2023-08-13 LAB — CBC
HCT: 47.5 % — ABNORMAL HIGH (ref 36.0–46.0)
Hemoglobin: 13.5 g/dL (ref 12.0–15.0)
MCH: 23.7 pg — ABNORMAL LOW (ref 26.0–34.0)
MCHC: 28.4 g/dL — ABNORMAL LOW (ref 30.0–36.0)
MCV: 83.5 fL (ref 80.0–100.0)
Platelets: 255 10*3/uL (ref 150–400)
RBC: 5.69 MIL/uL — ABNORMAL HIGH (ref 3.87–5.11)
RDW: 17.4 % — ABNORMAL HIGH (ref 11.5–15.5)
WBC: 10.8 10*3/uL — ABNORMAL HIGH (ref 4.0–10.5)
nRBC: 0 % (ref 0.0–0.2)

## 2023-08-13 LAB — GLUCOSE, CAPILLARY: Glucose-Capillary: 103 mg/dL — ABNORMAL HIGH (ref 70–99)

## 2023-08-13 LAB — PROCALCITONIN: Procalcitonin: <0.1 ng/mL

## 2023-08-13 MED ORDER — ENSURE ENLIVE PO LIQD
237.0000 mL | Freq: Two times a day (BID) | ORAL | Status: DC
  Administered 2023-08-15: 237 mL via ORAL

## 2023-08-13 MED ORDER — IOHEXOL 350 MG/ML SOLN
75.0000 mL | Freq: Once | INTRAVENOUS | Status: AC | PRN
  Administered 2023-08-13: 75 mL via INTRAVENOUS

## 2023-08-13 MED ORDER — SENNOSIDES-DOCUSATE SODIUM 8.6-50 MG PO TABS
2.0000 | ORAL_TABLET | Freq: Two times a day (BID) | ORAL | Status: DC
  Administered 2023-08-13 – 2023-08-18 (×6): 2 via ORAL
  Filled 2023-08-13 (×11): qty 2

## 2023-08-13 MED ORDER — CHLORHEXIDINE GLUCONATE CLOTH 2 % EX PADS
6.0000 | MEDICATED_PAD | Freq: Every day | CUTANEOUS | Status: DC
  Administered 2023-08-13 – 2023-08-20 (×7): 6 via TOPICAL

## 2023-08-13 MED ORDER — ORAL CARE MOUTH RINSE: 15.0000 mL | OROMUCOSAL | Status: DC | PRN

## 2023-08-13 MED ORDER — ORAL CARE MOUTH RINSE
15.0000 mL | OROMUCOSAL | Status: DC
  Administered 2023-08-13 – 2023-08-20 (×23): 15 mL via OROMUCOSAL

## 2023-08-13 MED ORDER — METHYLPREDNISOLONE SODIUM SUCC 40 MG IJ SOLR
40.0000 mg | Freq: Two times a day (BID) | INTRAMUSCULAR | Status: DC
  Administered 2023-08-13 – 2023-08-15 (×4): 40 mg via INTRAVENOUS
  Filled 2023-08-13 (×4): qty 1

## 2023-08-13 MED ORDER — SODIUM CHLORIDE 0.9 % IV SOLN
3.0000 g | Freq: Four times a day (QID) | INTRAVENOUS | Status: DC
  Administered 2023-08-13 – 2023-08-15 (×8): 3 g via INTRAVENOUS
  Filled 2023-08-13 (×13): qty 8

## 2023-08-13 MED ORDER — POLYETHYLENE GLYCOL 3350 17 G PO PACK
17.0000 g | PACK | Freq: Every day | ORAL | Status: DC
  Administered 2023-08-13 – 2023-08-19 (×5): 17 g via ORAL
  Filled 2023-08-13 (×7): qty 1

## 2023-08-13 NOTE — Plan of Care (Signed)

## 2023-08-13 NOTE — Progress Notes (Signed)
   08/13/23 0100  Vitals  ECG Heart Rate (!) 114  MEWS COLOR  MEWS Score Color Yellow  Pain Assessment  Pain Scale 0-10  Pain Score Asleep  MEWS Score  MEWS Temp 0  MEWS Systolic 0  MEWS Pulse 2  MEWS RR 0  MEWS LOC 0  MEWS Score 2   Dr. Thomes Dinning made aware of patients HR sustaining in the 110s. No new orders obtained.

## 2023-08-13 NOTE — Consult Note (Signed)
 Consultation Note Date: 08/13/2023   Patient Name: Elizabeth Cherry  DOB: 1956/05/18  MRN: [id number]  Age / Sex: 68 y.o., female  PCP: Rebekah Chesterfield, NP Referring Physician: Tyrone Nine, MD  Reason for Consultation: Establishing goals of care  HPI/Patient Profile: 68 y.o. female  with past medical history of COPD, asthma, right breast cancer, fibromyalgia, GERD, tobacco dependence admitted on 08/07/2023 with acute metabolic encephalopathy likely secondary to hypercapnia, acute on chronic hypoxic and hypercapnic respiratory failure, acute COPD exacerbation/tobacco dependence.   Clinical Assessment and Goals of Care: Face-to-face conference with bedside nursing staff related to patient condition, needs, goals of care.  I have reviewed medical records including EPIC notes, labs and imaging, received report from RN, assessed the patient.  Elizabeth Cherry is sitting up in the bed in the intensive care.  She is in tripod position with BiPAP in place.  Her eyes are closed, and she does not attempt to open them when I speak to her.  She is able to tell me her name, but not where we are.  I am not sure that she can make her basic needs known.  There is no family/friend present at bedside..  Call to [facility],  to discuss diagnosis prognosis, GOC, EOL wishes, disposition and options.  I introduced Palliative Medicine as specialized medical care for people living with serious illness. It focuses on providing relief from the symptoms and stress of a serious illness. The goal is to improve quality of life for both the patient and the family.  We discussed a brief life review of the patient.  That he shares that Elizabeth Cherry has been living independently with neighbors helping with IADLs.  She also has a Barrister's clerk for her monies.  She has been smoking cigarettes and prior to this admit had smoked 5 cartons of  cigarettes in 2 weeks.  Elizabeth Cherry states that she knows Elizabeth Cherry through her mother and they talk on the telephone almost daily.  We then focused on their current illness.  We talked about COPD, nutritional status, respiratory status, awaiting CT chest results.  We talked about time for outcomes.  We also talked about comfort and dignity at end-of-life, let nature take its course if no meaningful improvement.  Elizabeth Cherry states that Elizabeth Cherry had attempted suicide multiple times.  She shares that Elizabeth Cherry has stated that she is ready to die at any time.  We talked about time for outcomes.  The natural disease trajectory and expectations at EOL were discussed.  I attempted to elicit values and goals of care important to the patient.  The difference between aggressive medical intervention and comfort care was considered in light of the patient's goals of care.  See above.  Advanced directives, concepts specific to code status, artifical feeding and hydration, and rehospitalization were considered and discussed.  DNR verified, orders adjusted.  Hospice and Palliative Care services outpatient were explained and offered.  Elizabeth Cherry states that she is familiar with hospice care.  Considering comfort care if  no meaningful improvement.  Discussed the importance of continued conversation with family and the medical providers regarding overall plan of care and treatment options, ensuring decisions are within the context of the patient's values and GOCs.  Questions and concerns were addressed.  The family was encouraged to call with questions or concerns.  PMT will continue to support holistically.   Conference with attending, bedside nursing staff, transition of care team related to patient condition, needs, goals of care, disposition.   HCPOA  HCPOA Elizabeth Cherry (Health [phone].  Okay to leave detailed message on my cell phone voice mail [phone]     SUMMARY OF RECOMMENDATIONS    Continue to treat the treatable but no CPR or intubation Time for meaningful outcomes Considering comfort care if no meaningful improvement PMT to follow   Code Status/Advance Care Planning: DNR -orders adjusted.  Symptom Management:  Per hospitalist, no additional needs at this time.   Palliative Prophylaxis:  Frequent Pain Assessment and Oral Care  Additional Recommendations (Limitations, Scope, Preferences): Continue to treat the treatable but no CPR or intubation.  Psycho-social/Spiritual:  Desire for further Chaplaincy support:no Additional Recommendations: Caregiving  Support/Resources and Education on Hospice  Prognosis:  Unable to determine, based on outcomes.  Guarded at this point.   Discharge Planning: To Be Determined      Primary Diagnoses: Present on Admission:  Acute hypoxic respiratory failure (HCC)   I have reviewed the medical record, interviewed the patient and family, and examined the patient. The following aspects are pertinent.  Past Medical History:  Diagnosis Date   Alcoholism (HCC)    IN REMISSION ALMOST 20 YEARS   Allergic rhinitis    Anorectal hemorrhage    Anxiety    Asthma    Benign neoplasm of colon 10/24/2015   Borderline personality disorder (HCC)    Breast cancer of upper-inner quadrant of right female breast (HCC) 01/08/2016   Carotid stenosis, bilateral    Chronic gastritis without bleeding    OTHER, WITHOUT HEMORRHAGE   Chronic obstructive pulmonary disease (HCC) 11/15/2011   Chronic pain 10/24/2011   Overview:  Managed by [facility]. Gets Fentanyl Patches - sees them every other month.    COPD (chronic obstructive pulmonary disease) (HCC)    Depression    Dyslipidemia    Edema    due to psychiatric meds per patient, on a diuretic   Elevated cholesterol    Fibromyalgia    Generalized anxiety disorder    GERD (gastroesophageal reflux disease)    Hypertension    no meds now   Irritable bowel  syndrome    UNSPECIFIED TYPE   Malignant neoplasm of unspecified site of unspecified female breast (HCC)    Mixed hyperlipidemia    Multiple gastric ulcers    Personal history of colonic polyps    Psoriasis 02/11/2012   Sciatica    Self neglect 10/23/2015   Severe recurrent major depression without psychotic features (HCC) 10/23/2015   Tobacco dependence    Social History   Socioeconomic History   Marital status: Single    Spouse name: Not on file   Number of children: Not on file   Years of education: Not on file   Highest education level: Not on file  Occupational History   Not on file  Tobacco Use   Smoking status: Every Day    Current packs/day: 1.50    Types: Cigarettes   Smokeless tobacco: Never  Vaping Use   Vaping  status: Never Used  Substance and Sexual Activity   Alcohol use: No   Drug use: No   Sexual activity: Not on file  Other Topics Concern   Not on file  Social History Narrative   Not on file   Social Drivers of Health   Financial Resource Strain: Not on file  Food Insecurity: Not on file  Transportation Needs: Not on file  Physical Activity: Not on file  Stress: Not on file  Social Connections: Unknown (10/01/2021)   Received from [employer], [facility]   Social Network    Social Network: Not on file   Family History  Problem Relation Age of Onset   Colon cancer Maternal Grandmother    Scheduled Meds:  acetaZOLAMIDE ER  500 mg Oral Q12H   Chlorhexidine Gluconate Cloth  6 each Topical Daily   enoxaparin (LOVENOX) injection  40 mg Subcutaneous Q24H   fluconazole  100 mg Oral Daily   mometasone-formoterol  2 puff Inhalation BID   montelukast  10 mg Oral QHS   mouth rinse  15 mL Mouth Rinse 4 times per day   pantoprazole  40 mg Oral Daily   QUEtiapine  100 mg Oral QHS   sertraline  100 mg Oral Daily   sodium chloride flush  3 mL Intravenous Q12H   traMADol  50 mg Oral BID   Continuous Infusions: PRN Meds:.acetaminophen **OR**  acetaminophen, clonazePAM, hydrALAZINE, HYDROcodone-acetaminophen, ipratropium-albuterol, morphine injection, nicotine, mouth rinse Medications Prior to Admission:  Prior to Admission medications   Medication Sig Start Date End Date Taking? Authorizing Provider  acetaminophen (TYLENOL) 500 MG tablet Take 1,000 mg by mouth every 6 (six) hours as needed.   Yes [provider]  albuterol (VENTOLIN HFA) 108 (90 Base) MCG/ACT inhaler Inhale 1-2 puffs into the lungs every 6 (six) hours as needed for wheezing or shortness of breath. 02/08/20  Yes Philip Aspen, Limmie Patricia, MD  budeson-glycopyrrolate-formoterol 160-9-4.8 MCG/ACT AERO Inhale 1 puff into the lungs in the morning and at bedtime.   Yes [provider]  clonazePAM (KLONOPIN) 0.5 MG tablet Take 1 tablet (0.5 mg total) by mouth 3 (three) times daily as needed (anxiety or sleep). Patient taking differently: Take 0.5 mg by mouth 3 (three) times daily. 11/09/15  Yes Pucilowska, Jolanta B, MD  dicyclomine (BENTYL) 20 MG tablet Take 20 mg by mouth every 6 (six) hours as needed for spasms. 12/27/19  Yes [provider]  hydrochlorothiazide (HYDRODIURIL) 50 MG tablet TAKE 1 TABLET DAILY 11/21/21  Yes Philip Aspen, Limmie Patricia, MD  hydrOXYzine (VISTARIL) 50 MG capsule Take 2 capsules (100 mg total) by mouth at bedtime. Patient taking differently: Take 200 mg by mouth at bedtime. 09/28/19  Yes Philip Aspen, Limmie Patricia, MD  montelukast (SINGULAIR) 10 MG tablet Take 1 tablet (10 mg total) by mouth daily. 02/08/20  Yes Philip Aspen, Limmie Patricia, MD  pantoprazole (PROTONIX) 40 MG tablet Take 1 tablet (40 mg total) by mouth daily. 02/08/20  Yes Philip Aspen, Limmie Patricia, MD  QUEtiapine (SEROQUEL) 50 MG tablet Take 200 mg by mouth at bedtime.   Yes [provider]  sertraline (ZOLOFT) 100 MG tablet Take 100 mg by mouth daily.   Yes [provider]  traMADol (ULTRAM) 50 MG tablet Take 1 tablet (50 mg total) by mouth 2  (two) times daily. 06/13/20  Yes Henderson Cloud, MD   Allergies  Allergen Reactions   Bactrim [Sulfamethoxazole-Trimethoprim] Hives   Diazepam Other (See Comments)  Severe depression   Fluoxetine Anaphylaxis and Swelling    Pt reports she is allergic to filler in prozac, her throat swell.    Telmisartan Hives        Tetracycline Hives and Rash   Tetracyclines & Related Hives and Rash   Aspirin Other (See Comments)    Gastric ulcers    Cefdinir Other (See Comments)    EXTREME EXHAUSTION   Clarithromycin Other (See Comments)    unknown   Nsaids Other (See Comments)    Gastric ulcers   Risperidone Nausea And Vomiting   Trazodone Other (See Comments)    Dry eyes   Atorvastatin Other (See Comments)    20mg  caused myalgias   Oxycodone Nausea Only   Praluent [Alirocumab] Other (See Comments)    Joint pain   Pravastatin Other (See Comments)    40mg  caused myalgias   Repatha [Evolocumab] Other (See Comments)    Myalgias and flu like symptoms   Rosuvastatin Other (See Comments)    myalgias   Review of Systems  Unable to perform ROS: Other    Physical Exam Vitals and nursing note reviewed.  Constitutional:      General: She is not in acute distress.    Appearance: She is ill-appearing.  Musculoskeletal:     Comments: Frail and thin   Skin:    General: Skin is warm and dry.     Findings: Bruising present.  Neurological:     Comments: Oriented to self only     Vital Signs: BP 125/85   Pulse (!) 122   Temp 98.1 F (36.7 C) (Oral)   Resp (!) 23   Ht 5\' 6"  (1.676 m)   Wt 57.6 kg   SpO2 98%   BMI 20.50 kg/m  Pain Scale: 0-10 POSS *See Group Information*: S-Acceptable,Sleep, easy to arouse Pain Score: Asleep   SpO2: SpO2: 98 % O2 Device:SpO2: 98 % O2 Flow Rate: .O2 Flow Rate (L/min): 4 L/min  IO: Intake/output summary:  Intake/Output Summary (Last 24 hours) at 08/13/2023 1000 Last data filed at 08/12/2023 2204 Gross per 24 hour  Intake 123 ml   Output --  Net 123 ml    LBM: Last BM Date : 08/10/23 Baseline Weight: Weight: 65 kg Most recent weight: Weight: 57.6 kg     Palliative Assessment/Data:     Time In: 1030    Time Out: 1145 Time Total: 75 minutes  Greater than 50%  of this time was spent counseling and coordinating care related to the above assessment and plan.  Signed by: Katheran Awe, NP   Please contact Palliative Medicine Team phone at [phone] for questions and concerns.  For individual provider: See Loretha Stapler

## 2023-08-13 NOTE — Plan of Care (Signed)

## 2023-08-13 NOTE — Progress Notes (Signed)
 Dr. Thomes Dinning bedside, see new orders. RT notified.

## 2023-08-13 NOTE — Progress Notes (Signed)
   08/13/23 0547  Provider Notification  Provider Name/Title Dr. Thomes Dinning  Date Provider Notified 08/13/23  Time Provider Notified [phone]  Method of Notification Page  Notification Reason Other (Comment) (page)  Test performed and critical result PCO2-87  Date Critical Result Received 08/13/23  Time Critical Result Received 0547  Provider response Other (Comment) (awaiting orders)

## 2023-08-13 NOTE — Progress Notes (Signed)
 Patient presenting with increased work of breathing, confusion, and hypotension. Dr Thomes Dinning made aware, awaiting orders at this time.

## 2023-08-13 NOTE — Progress Notes (Signed)
 OT Cancellation Note  Patient Details Name: Elizabeth Cherry MRN: [id number] DOB: 1955-06-19   Cancelled Treatment:    Reason Eval/Treat Not Completed: Medical issues which prohibited therapy. Patient transferred to a higher level of care and will need new OT consult to resume therapy when patient is medically stable. Thank you.   Maynard David OT, MOT  Danie Chandler 08/13/2023, 8:08 AM

## 2023-08-13 NOTE — Plan of Care (Signed)

## 2023-08-13 NOTE — Progress Notes (Addendum)
 Progress Note  RN called due to change in breathing efforts.  At bedside, she was noted to be a mouth breather and apparently she has not been compliant with bedside BiPAP.  O2 sats was normal at 95% on supplemental oxygen at 4 LPM.  ABG    Component Value Date/Time   PHART 7.22 (L) 08/13/2023 0524   PCO2ART 87 (HH) 08/13/2023 0524   PO2ART 101 08/13/2023 0524   HCO3 35.6 (H) 08/13/2023 0524   TCO2 27 02/14/2016 1048   O2SAT 98 08/13/2023 0524   Chest x-ray ordered and personally reviewed showed no evidence of acute cardiopulmonary disease. EKG shows sinus tachycardia at a rate of 110 bpm  Assessment and plan  Acute on chronic hypoxic and hypercapnic respiratory failure requiring NIPPV ABG 7.22/87/35.6 She was placed back on BiPAP and patient was transferred to stepdown unit Continue management as described pulmonary team  Total time:  33 minutes This includes time reviewing the chart including progress notes, labs, EKGs, taking medical decisions, ordering labs and documenting findings.  Please refer to admission H&P and progress notes regarding the care of this patient

## 2023-08-13 NOTE — Progress Notes (Signed)
 Dr Jarvis Newcomer made aware of NO documented BM in flowsheet noted since admission. Patient unable to answer question as she is on Bipap and really only nods yes and no to questions at this time.

## 2023-08-13 NOTE — Progress Notes (Signed)
 PT Cancellation Note  Patient Details Name: Elizabeth Cherry MRN: [id number] DOB: 10/29/1955   Cancelled Treatment:    Reason Eval/Treat Not Completed: Medical issues which prohibited therapy. Patient transferred to a higher level of care and will need new PT consult to resume therapy when patient is medically stable.  Thank you.    7:45 AM, 08/13/23 Ocie Bob, MPT Physical Therapist with [facility] [phone] office [phone] mobile phone

## 2023-08-13 NOTE — Progress Notes (Signed)
 Pt. Taken to CT on BiPAP.  No issues with the trip and Elizabeth Cherry is back in her room without incident.

## 2023-08-13 NOTE — Progress Notes (Signed)
 RT Note:1950- Spoke with pt about wearing the Bipap tonight, at this time she is stating that she is not going to wear. RT will attempt again later.

## 2023-08-13 NOTE — Progress Notes (Signed)
 Spoke with Elizabeth Cherry about patient being transferred to ICU, unable to reach patients legal guardian vivian. Patient handed off to [facility]. All belongings sent to ICU with patient.

## 2023-08-13 NOTE — Progress Notes (Addendum)
 [facility] PROGRESS NOTE  Elizabeth Cherry (DOB: 11-26-1955) ZOX:[id number] PCP: Rebekah Chesterfield, NP  Brief Narrative: Elizabeth Cherry is a 68 y.o. female with a past medical history of COPD, asthma, right breast cancer, fibromyalgia, GERD, tobacco dependence admitted on 08/07/2023 with acute metabolic encephalopathy likely secondary to hypercapnia, acute on chronic hypoxic and hypercapnic respiratory failure, and AECOPD. She was treated with BiPAP with subsequent improvement, transition to nasal cannula, and ultimately transferred to the floor. After declining nocturnal BiPAP, she suffered worsening obtundation and respiratory acidosis prompting return to ICU on 3/26.  Subjective: Appropriately answers yes/no questions this afternoon. This morning was not meaningfully responsive, discussed with her friend/neighbor/local caretaker who was at bedside. No BM for many days per RN. Pt denies abd pain.   Objective: BP (!) 117/92   Pulse (!) 113   Temp 97.6 F (36.4 C) (Oral)   Resp 19   Ht 5\' 6"  (1.676 m)   Wt 57.6 kg   SpO2 98%   BMI 20.50 kg/m   Gen: Frail female appearing older than stated age Pulm: Diminished without wheezes or crackles, tachypneic  CV: Sinus tachycardia in 100-110's, no MRG. Trace R > L LE edema. GI: Soft, NT, ND, +BS  Ext: Warm, no deformities Skin: No rashes, lesions or ulcers on visualized skin   Assessment & Plan: Acute metabolic encephalopathy - Likely secondary to hypercapnia.  Appears resolved   Acute hypoxic and acute on chronic hypercapnic respiratory failure: On presentation ABG 7.29/95/32.  Initially placed on BiPAP, weaned to nasal cannula at 2-3 LPM, no O2 requirement PTA.  - Likely suffered simple progressive hypercapnia associated with hypoventilation with sleeping. No pneumonia on CXR or pleural effusion or clinical CHF/pulmonary edema. Has been getting lovenox 40mg  VTE ppx throughout stay here, though does have hx breast CA so is  hypercoagulable, will check CTA chest to see parenchyma and r/o PE as cause of abrupt decompensation.  - Wean to nasal cannula today since hypercarbia has improved, pH normalized, continue with mandatory BiPAP when resting for now.  - Continue acetazolamide.   Acute COPD exacerbation/asthma:  - Completed steroid burst x3 days and empiric levaquin x5 days (3/20 - 3/24). Mild leukocytosis noted today, though no infiltrate on CXR. Will restart 40mg  steroid and continue scheduled and prn bronchodilators.   Concern for sepsis - Dyspnea, tachypnea, leukocytosis, possible pneumonia, on presentation give concern for sepsis.  Blood cultures, IV fluid hydration, empiric antibiotics initiated. Appears to be resolved.   Tobacco dependence - Encourage cessation.  Nicotine patch as needed.   Hypertension - Likely exacerbated by respiratory failure.  Hydralazine IV as needed.   Generalized anxiety disorder - Resume home Xanax.   Physical debilitation and muscle weakness, failure to thrive, goals of care counseling: Progressive impairment in functional status over the course of months per caretaker. Pt has also reported that she would not want intubation or resuscitation.  - Change to DNR/DNI status, though ok to continue measures as we are doing. If improves, then would consider longitudinal palliative care involvement. Do not suspect she could be safely discharged to a home environment at this time. If no improvement or further deterioration, would convert to comfort measures per discussion with medical proxy.  - Appreciate palliative care discussions with pt/decision makers, will touch base again in AM.   Constipation: Abd benign.  - Start regimen.   Tyrone Nine, MD [facility] [email or web address] 08/13/2023, 4:01 PM

## 2023-08-14 DIAGNOSIS — Z515 Encounter for palliative care: Secondary | ICD-10-CM | POA: Diagnosis not present

## 2023-08-14 DIAGNOSIS — Z7189 Other specified counseling: Secondary | ICD-10-CM | POA: Diagnosis not present

## 2023-08-14 DIAGNOSIS — J9601 Acute respiratory failure with hypoxia: Secondary | ICD-10-CM | POA: Diagnosis not present

## 2023-08-14 LAB — COMPREHENSIVE METABOLIC PANEL WITH GFR
ALT: 13 U/L (ref 0–44)
AST: 10 U/L — ABNORMAL LOW (ref 15–41)
Albumin: 3.4 g/dL — ABNORMAL LOW (ref 3.5–5.0)
Alkaline Phosphatase: 63 U/L (ref 38–126)
Anion gap: 9 (ref 5–15)
BUN: 27 mg/dL — ABNORMAL HIGH (ref 8–23)
CO2: 31 mmol/L (ref 22–32)
Calcium: 9 mg/dL (ref 8.9–10.3)
Chloride: 100 mmol/L (ref 98–111)
Creatinine, Ser: 0.54 mg/dL (ref 0.44–1.00)
GFR, Estimated: >60 mL/min (ref >60)
Glucose, Bld: 132 mg/dL — ABNORMAL HIGH (ref 70–99)
Potassium: 3.4 mmol/L — ABNORMAL LOW (ref 3.5–5.1)
Sodium: 140 mmol/L (ref 135–145)
Total Bilirubin: 0.9 mg/dL (ref 0.0–1.2)
Total Protein: 6.3 g/dL — ABNORMAL LOW (ref 6.5–8.1)

## 2023-08-14 LAB — CBC
HCT: 44.9 % (ref 36.0–46.0)
Hemoglobin: 13.3 g/dL (ref 12.0–15.0)
MCH: 23.9 pg — ABNORMAL LOW (ref 26.0–34.0)
MCHC: 29.6 g/dL — ABNORMAL LOW (ref 30.0–36.0)
MCV: 80.8 fL (ref 80.0–100.0)
Platelets: 244 10*3/uL (ref 150–400)
RBC: 5.56 MIL/uL — ABNORMAL HIGH (ref 3.87–5.11)
RDW: 16.8 % — ABNORMAL HIGH (ref 11.5–15.5)
WBC: 9.5 10*3/uL (ref 4.0–10.5)
nRBC: 0 % (ref 0.0–0.2)

## 2023-08-14 LAB — GLUCOSE, CAPILLARY: Glucose-Capillary: 148 mg/dL — ABNORMAL HIGH (ref 70–99)

## 2023-08-14 MED ORDER — POTASSIUM CHLORIDE CRYS ER 20 MEQ PO TBCR
40.0000 meq | EXTENDED_RELEASE_TABLET | Freq: Once | ORAL | Status: AC
  Administered 2023-08-14: 40 meq via ORAL
  Filled 2023-08-14: qty 2

## 2023-08-14 MED ORDER — METOPROLOL TARTRATE 5 MG/5ML IV SOLN
2.5000 mg | Freq: Once | INTRAVENOUS | Status: AC
Start: 2023-08-14 — End: 2023-08-14
  Administered 2023-08-14: 2.5 mg via INTRAVENOUS
  Filled 2023-08-14: qty 5

## 2023-08-14 MED ORDER — LACTATED RINGERS IV BOLUS
1000.0000 mL | Freq: Once | INTRAVENOUS | Status: AC
  Administered 2023-08-14: 1000 mL via INTRAVENOUS

## 2023-08-14 NOTE — Plan of Care (Signed)

## 2023-08-14 NOTE — Progress Notes (Signed)
 [facility] PROGRESS NOTE  Elizabeth Cherry (DOB: 02-01-1956) ZOX:[id number] PCP: Rebekah Chesterfield, NP  Brief Narrative: Elizabeth Cherry is a 68 y.o. female with a past medical history of COPD, asthma, right breast cancer, fibromyalgia, GERD, tobacco dependence admitted on 08/07/2023 with acute metabolic encephalopathy likely secondary to hypercapnia, acute on chronic hypoxic and hypercapnic respiratory failure, and AECOPD. She was treated with BiPAP with subsequent improvement, transition to nasal cannula, and ultimately transferred to the floor. After declining nocturnal BiPAP, she suffered worsening obtundation and respiratory acidosis prompting return to ICU on 3/26. Palliative care discussions are ongoing.   Subjective: More alert, interactive with me. Says she doesn't want the BiPAP back on under any circumstances. Slept ok, says "I can't eat that" to her breakfast try. When asked what she would eat at home she replies, "coffee." She has no particular complaints this morning.   Objective: BP 99/74   Pulse (!) 105   Temp 97.9 F (36.6 C) (Oral)   Resp (!) 23   Ht 5\' 6"  (1.676 m)   Wt 57.6 kg   SpO2 95%   BMI 20.50 kg/m   Gen: Cachectic, frail female in no distress Pulm: Diminished without crackles or wheezing.  CV: Regular tachycardia without MRG GI: Soft, NT, ND, +BS  Neuro: Alert and oriented. No new focal deficits. Ext: Warm, no deformities Skin: No rashes, lesions or ulcers on visualized skin   Assessment & Plan: Acute metabolic encephalopathy - Likely secondary to hypercapnia.  Appears resolved, though can be expected to recur if underlying problem is allowed to recur.   Acute hypoxic and acute on chronic hypercapnic respiratory failure: On presentation ABG 7.29/95/32.  Initially placed on BiPAP, weaned to nasal cannula at 2-3 LPM, no O2 requirement PTA. On 3/26 had lethargy dur to recurrent hypercarbic respiratory failure treated successfully with BiPAP. We are  continuing monitoring in SDU.  - Weaned to nasal cannula, still on 6L with goal SpO2 90-95% (avoid over supplementation).   - Continue acetazolamide.   Acute COPD exacerbation/asthma:  - Completed steroid burst x3 days and empiric levaquin x5 days (3/20 - 3/24). Restarted steroid and antibiotic with opacity on CTA chest (no PE). PCT reassuring. Continue scheduled and prn bronchodilators.   Concern for sepsis - Dyspnea, tachypnea, leukocytosis, possible pneumonia, on presentation give concern for sepsis.  Blood cultures, IV fluid hydration, empiric antibiotics initiated. Appears to be resolved.   Tobacco dependence - Encourage cessation.  Nicotine patch as needed.   Hypertension - Likely exacerbated by respiratory failure.  Hydralazine IV as needed.   Generalized anxiety disorder - Resume home Xanax.   Hypokalemia:  - Supplement and monitor  Physical debilitation and muscle weakness, failure to thrive, goals of care counseling: Progressive impairment in functional status over the course of months per caretaker. Pt has also reported that she would not want intubation or resuscitation.  - Continue DNR/DNI status, if improvement continues, then would consider longitudinal palliative care involvement. Do not suspect she could be safely discharged to a home environment at this time.  - If she deteriorates further, would need to touch base again with HCPOA and likely convert to comfort measures per discussion with medical proxy.  - Appreciate palliative care discussions with pt/decision makers  Constipation: Abd benign.  - Started regimen.   Tyrone Nine, MD [facility] [email or web address] 08/14/2023, 3:44 PM

## 2023-08-14 NOTE — Progress Notes (Signed)
 Palliative:   Face to face discussion with bedside nursing staff related to patient condition, needs. Elizabeth Cherry will decline BiPAP at times.   Elizabeth Cherry is resting quietly in bed.  She does not respond to gentle voice or touch.  Per nursing staff Elizabeth Cherry is oriented to self and situation only.  I'm not sure she can make her needs known.  There is no family/friend at bedside at this time.    Call to [facility], Elizabeth Cherry.  We talk about Elizabeth Cherry's acute and chronic health concerns and the treatment plan.  I share that Elizabeth Cherry continues to decline BiPAP.  We talked about the possibility of Elizabeth Cherry becoming unresponsive, asking Elizabeth Cherry if we would place the BiPAP on even though Elizabeth Cherry does not want this.  Elizabeth Cherry states in detail that she knows Elizabeth Cherry to state that she is ready to die at any time, she has peace.  I shared that over the next day or so Elizabeth Cherry may be called to make choices for comfort and dignity, to let nature take its course.  Conference with attending, bedside nursing staff and transition of care team related to patient condition, needs, goals of care.   Plan: Continue to treat the treatable but no CPR or intubation.  24 to 48 hours for outcomes.  Possible transition to comfort care if no improvements.  50 minutes  Elizabeth Carmel, Elizabeth Cherry Palliative Medicine Team  Team phone [phone]

## 2023-08-15 DIAGNOSIS — J9601 Acute respiratory failure with hypoxia: Secondary | ICD-10-CM | POA: Diagnosis not present

## 2023-08-15 MED ORDER — AMOXICILLIN-POT CLAVULANATE 875-125 MG PO TABS
1.0000 | ORAL_TABLET | Freq: Two times a day (BID) | ORAL | Status: DC
  Administered 2023-08-15: 1 via ORAL
  Filled 2023-08-15: qty 1

## 2023-08-15 MED ORDER — METHYLPREDNISOLONE SODIUM SUCC 40 MG IJ SOLR: 40.0000 mg | Freq: Every day | INTRAMUSCULAR | Status: DC

## 2023-08-15 NOTE — Plan of Care (Signed)
  Problem: Acute Rehab OT Goals (only OT should resolve) Goal: Pt. Will Perform Grooming Flowsheets (Taken 08/15/2023 1414) Pt Will Perform Grooming: with modified independence Goal: Pt. Will Perform Upper Body Dressing Flowsheets (Taken 08/15/2023 1414) Pt Will Perform Upper Body Dressing: with modified independence Goal: Pt. Will Perform Lower Body Dressing Flowsheets (Taken 08/15/2023 1414) Pt Will Perform Lower Body Dressing: with modified independence Goal: Pt. Will Transfer To Toilet Flowsheets (Taken 08/15/2023 1414) Pt Will Transfer to Toilet: with modified independence Goal: Pt. Will Perform Toileting-Clothing Manipulation Flowsheets (Taken 08/15/2023 1414) Pt Will Perform Toileting - Clothing Manipulation and hygiene: with modified independence Goal: Pt/Caregiver Will Perform Home Exercise Program Flowsheets (Taken 08/15/2023 1414) Pt/caregiver will Perform Home Exercise Program:  Increased ROM  Increased strength  Both right and left upper extremity  Sona Nations OT, MOT

## 2023-08-15 NOTE — Progress Notes (Signed)
 [facility] PROGRESS NOTE  Elizabeth Cherry (DOB: Apr 30, 1956) ZOX:[id number] PCP: Rebekah Chesterfield, NP  Brief Narrative: Elizabeth Cherry is a 68 y.o. female with a past medical history of COPD, asthma, right breast cancer, fibromyalgia, GERD, tobacco dependence admitted on 08/07/2023 with acute metabolic encephalopathy likely secondary to hypercapnia, acute on chronic hypoxic and hypercapnic respiratory failure, and AECOPD. She was treated with BiPAP with subsequent improvement, transition to nasal cannula, and ultimately transferred to the floor. After declining nocturnal BiPAP, she suffered worsening obtundation and respiratory acidosis prompting return to ICU on 3/26. Palliative care discussions are ongoing.   Subjective: Wore BiPAP some overnight, has no complaints this morning. RN got her up to chair yesterday.   Objective: BP 92/64   Pulse (!) 104   Temp 98.1 F (36.7 C) (Oral)   Resp (!) 23   Ht 5\' 6"  (1.676 m)   Wt 57.6 kg   SpO2 95%   BMI 20.50 kg/m   Gen: Frail female in no acute distress Pulm: Diminished without wheezes or crackles, tachypneic   CV: Regular tachycardia, no pitting LE edema GI: Soft, NT, ND, +BS  Neuro: Sleeping but rousable, interactive. No new focal deficits. Ext: Warm, no deformities. Skin: No open wounds on visualized skin   Assessment & Plan: Acute metabolic encephalopathy: Likely secondary to hypercapnia.  Appears resolved, though can be expected to recur if underlying problem is allowed to recur.   Acute hypoxic and acute on chronic hypercapnic respiratory failure: On presentation ABG 7.29/95/32.  Initially placed on BiPAP, weaned to nasal cannula at 2-3 LPM, no O2 requirement PTA. On 3/26 had lethargy dur to recurrent hypercarbic respiratory failure treated successfully with BiPAP. We are continuing monitoring in SDU.  - Weaned to nasal cannula, still on 6L with goal SpO2 90-95% (avoid over supplementation).   - Continue acetazolamide.    Acute COPD exacerbation/asthma:  - Completed steroid burst x3 days and empiric levaquin x5 days (3/20 - 3/24). Restarted steroid and antibiotic with opacity on CTA chest (no PE). PCT reassuring. Will convert to daily steroid and to augmentin to complete 5 days.  - Continue dulera, singulair and prn duoneb.      Tobacco use:  - Continue nicotine patch prn (hasn't gotten this yet) and cessation counseling.   Hypertension:  - Continue prn hydralazine   Generalized anxiety disorder - Continue seroquel, sertraline, clonazepam   Hypokalemia:  - Supplemented, will continue to monitor  Physical debilitation and muscle weakness, failure to thrive, goals of care counseling: Progressive impairment in functional status over the course of months per caretaker. Pt has also reported that she would not want intubation or resuscitation.  - Continue DNR/DNI status, if improvement continues, then would consider longitudinal palliative care involvement.  - As respiratory status improves slowly, disposition is being considered. Will reorder PT and OT evaluations to help inform this decision though pt is adamantly against SNF at this time. Lives alone, neighbor checks on her but cannot offer 24 hours supervision/assistance.  - If she deteriorates further, would need to touch base again with HCPOA and likely convert to comfort measures per discussion with medical proxy.  - Appreciate palliative care discussions with pt/decision makers  Constipation: Abd benign.  - Continue regimen.   GERD:  - PPI  Concern for sepsis due to pneumonia: Resolved. Dyspnea, tachypnea, leukocytosis, possible pneumonia, on presentation give concern for sepsis.  Blood cultures NGTD.   Tyrone Nine, MD [facility] [email or web address] 08/15/2023, 10:51 AM

## 2023-08-15 NOTE — Progress Notes (Signed)
 Patient off BIPAP and back on nasal cannula at this time.

## 2023-08-15 NOTE — Evaluation (Signed)
 Occupational Therapy Evaluation Patient Details Name: Elizabeth Cherry MRN: [id number] DOB: [birth year] Today's Date: 08/15/2023   History of Present Illness   Elizabeth Cherry is a 68 y.o. female with medical history significant of COPD, asthma, right breast cancer, fibromyalgia, GERD, tobacco dependence, presenting with severe respiratory distress and worsening shortness of breath.  Per reports from EMS, patient has had worsening shortness of breath and wheezing for a week.  Showed up to doctor's office with noted O2 sats in the 60s.  Subsequently placed on 6 L nasal cannula with improvement in O2.  Patient had admitted that she had an illness initially with nasal congestion and sore throat that progressed to worsening shortness of breath.     Clinical Impressions Pt agreeable to OT evaluation. Pt not oriented today. Pt also very lethargic which improved as the session progressed. Pt required CGA to min A for transfers today. Mod A needed for peri-care to ensure the pt was clean. Pt demonstrates B Ue weakness with need for mod A to sit up at EOB. Pt open to rehab at this point due to weakness. Pt left in the chair with call bell within reach and RN notified. Pt will benefit from continued OT in the hospital and recommended venue below to increase strength, balance, and endurance for safe ADL's.        If plan is discharge home, recommend the following:   A little help with walking and/or transfers;Assistance with cooking/housework;Assist for transportation;Help with stairs or ramp for entrance;Direct supervision/assist for medications management;A little help with bathing/dressing/bathroom     Functional Status Assessment   Patient has had a recent decline in their functional status and demonstrates the ability to make significant improvements in function in a reasonable and predictable amount of time.     Equipment Recommendations   None recommended by OT              Precautions/Restrictions   Precautions Precautions: Fall Recall of Precautions/Restrictions: Impaired Restrictions Weight Bearing Restrictions Per Provider Order: No     Mobility Bed Mobility Overal bed mobility: Needs Assistance Bed Mobility: Supine to Sit     Supine to sit: Mod assist     General bed mobility comments: Pt very lethargic. Two hand assist to sit up; assist to scoot B LE to EOB as well.    Transfers Overall transfer level: Needs assistance Equipment used: Rolling walker (2 wheels) Transfers: Sit to/from Stand, Bed to chair/wheelchair/BSC Sit to Stand: Contact guard assist, Min assist     Step pivot transfers: Contact guard assist     General transfer comment: CGA for all sit to stand and step pivot except from [facility]. CGA to min A with cuing for safey use of RW.      Balance Overall balance assessment: Needs assistance Sitting-balance support: Feet supported, No upper extremity supported Sitting balance-Leahy Scale: Fair Sitting balance - Comments: seated at EOB   Standing balance support: Bilateral upper extremity supported, During functional activity Standing balance-Leahy Scale: Poor Standing balance comment: poor to fair with RW                           ADL either performed or assessed with clinical judgement   ADL Overall ADL's : Needs assistance/impaired Eating/Feeding: Modified independent;Set up   Grooming: Set up;Contact guard assist;Sitting   Upper Body Bathing: Set up;Sitting;Contact guard assist   Lower Body Bathing: Set up;Sitting/lateral leans;Contact guard assist   Upper Body  Dressing : Set up;Sitting;Contact guard assist   Lower Body Dressing: Set up;Contact guard assist;Sitting/lateral leans   Toilet Transfer: Contact guard assist;Minimal assistance;Rolling walker (2 wheels);Ambulation;Stand-pivot Toilet Transfer Details (indicate cue type and reason): Chair to [facility] and back with RW. Toileting- Clothing  Manipulation and Hygiene: Sitting/lateral lean;Moderate assistance Toileting - Clothing Manipulation Details (indicate cue type and reason): Pt was able to complete peri-care but needed assist to clean well. Completed lateral leans by pt and sit to stand by this therapist.     Functional mobility during ADLs: Contact guard assist;Rolling walker (2 wheels) General ADL Comments: Pt only able to ambulate ~4 feet before needing to sit.     Vision Baseline Vision/History: 1 Wears glasses Ability to See in Adequate Light: 1 Impaired Patient Visual Report: No change from baseline Vision Assessment?: No apparent visual deficits     Perception Perception: Not tested       Praxis Praxis: Not tested       Pertinent Vitals/Pain Pain Assessment Pain Assessment: Faces Faces Pain Scale: No hurt     Extremity/Trunk Assessment Upper Extremity Assessment Upper Extremity Assessment: Generalized weakness (mild A/ROM deficits at shoulder bilaterally. Likely just weakness.)   Lower Extremity Assessment Lower Extremity Assessment: Defer to PT evaluation   Cervical / Trunk Assessment Cervical / Trunk Assessment: Kyphotic   Communication Communication Communication: No apparent difficulties   Cognition Arousal: Lethargic Behavior During Therapy: WFL for tasks assessed/performed Cognition: No family/caregiver present to determine baseline             OT - Cognition Comments: Pt not oriented to place or year.                 Following commands: Intact       Cueing  General Comments   Cueing Techniques: Verbal cues;Tactile cues                 Home Living Family/patient expects to be discharged to:: Private residence Living Arrangements: Alone Available Help at Discharge: Family;Friend(s);Available PRN/intermittently Type of Home: House Home Access: Level entry           Bathroom Shower/Tub: Tub/shower unit   Bathroom Toilet: Standard     Home Equipment:  Agricultural consultant (2 wheels)   Additional Comments: Pt now reports that she lives alone and would be alone at times if she went home.      Prior Functioning/Environment Prior Level of Function : Independent/Modified Independent             Mobility Comments: Tourist information centre manager without AD ADLs Comments: Independent per pt    OT Problem List: Decreased strength;Decreased activity tolerance;Impaired balance (sitting and/or standing);Decreased cognition   OT Treatment/Interventions: Self-care/ADL training;Therapeutic exercise;DME and/or AE instruction;Therapeutic activities;Patient/family education      OT Goals(Current goals can be found in the care plan section)   Acute Rehab OT Goals Patient Stated Goal: improve fucntion; open to rehab OT Goal Formulation: With patient Time For Goal Achievement: 08/29/23 Potential to Achieve Goals: Good   OT Frequency:  Min 2X/week                                   End of Session Equipment Utilized During Treatment: Rolling walker (2 wheels);Gait belt;Oxygen  Activity Tolerance: Patient tolerated treatment well Patient left: in chair;with call bell/phone within reach  OT Visit Diagnosis: Unsteadiness on feet (R26.81);Other abnormalities of gait and mobility (R26.89);Muscle weakness (generalized) (M62.81)  Time: 1610-9604 OT Time Calculation (min): 36 min Charges:  OT General Charges $OT Visit: 1 Visit OT Evaluation $OT Eval Low Complexity: 1 Low  Elizabeth Cherry OT, MOT   Elizabeth Cherry 08/15/2023, 2:07 PM

## 2023-08-15 NOTE — Plan of Care (Signed)
  Problem: Education: Goal: Knowledge of General Education information will improve Description: Including pain rating scale, medication(s)/side effects and non-pharmacologic comfort measures Outcome: Progressing   Problem: Health Behavior/Discharge Planning: Goal: Ability to manage health-related needs will improve Outcome: Progressing   Problem: Clinical Measurements: Goal: Ability to maintain clinical measurements within normal limits will improve Outcome: Progressing Goal: Will remain free from infection Outcome: Progressing Goal: Diagnostic test results will improve Outcome: Progressing Goal: Respiratory complications will improve Outcome: Progressing Goal: Cardiovascular complication will be avoided Outcome: Progressing   Problem: Activity: Goal: Risk for activity intolerance will decrease Outcome: Progressing   Problem: Nutrition: Goal: Adequate nutrition will be maintained Outcome: Progressing   Problem: Coping: Goal: Level of anxiety will decrease Outcome: Progressing   Problem: Pain Managment: Goal: General experience of comfort will improve and/or be controlled Outcome: Progressing   Problem: Safety: Goal: Ability to remain free from injury will improve Outcome: Progressing   Problem: Skin Integrity: Goal: Risk for impaired skin integrity will decrease Outcome: Progressing   Problem: Education: Goal: Knowledge of disease or condition will improve Outcome: Progressing Goal: Knowledge of the prescribed therapeutic regimen will improve Outcome: Progressing Goal: Individualized Educational Video(s) Outcome: Progressing   Problem: Activity: Goal: Ability to tolerate increased activity will improve Outcome: Progressing Goal: Will verbalize the importance of balancing activity with adequate rest periods Outcome: Progressing   Problem: Respiratory: Goal: Ability to maintain a clear airway will improve Outcome: Progressing Goal: Levels of oxygenation  will improve Outcome: Progressing Goal: Ability to maintain adequate ventilation will improve Outcome: Progressing   Problem: Activity: Goal: Ability to tolerate increased activity will improve Outcome: Progressing   Problem: Clinical Measurements: Goal: Ability to maintain a body temperature in the normal range will improve Outcome: Progressing   Problem: Respiratory: Goal: Ability to maintain adequate ventilation will improve Outcome: Progressing Goal: Ability to maintain a clear airway will improve Outcome: Progressing

## 2023-08-16 DIAGNOSIS — J9601 Acute respiratory failure with hypoxia: Secondary | ICD-10-CM | POA: Diagnosis not present

## 2023-08-16 LAB — COMPREHENSIVE METABOLIC PANEL WITH GFR
ALT: 11 U/L (ref 0–44)
AST: 10 U/L — ABNORMAL LOW (ref 15–41)
Albumin: 2.7 g/dL — ABNORMAL LOW (ref 3.5–5.0)
Alkaline Phosphatase: 47 U/L (ref 38–126)
Anion gap: 7 (ref 5–15)
BUN: 19 mg/dL (ref 8–23)
CO2: 32 mmol/L (ref 22–32)
Calcium: 8.4 mg/dL — ABNORMAL LOW (ref 8.9–10.3)
Chloride: 99 mmol/L (ref 98–111)
Creatinine, Ser: 0.56 mg/dL (ref 0.44–1.00)
GFR, Estimated: >60 mL/min (ref >60)
Glucose, Bld: 95 mg/dL (ref 70–99)
Potassium: 2.8 mmol/L — ABNORMAL LOW (ref 3.5–5.1)
Sodium: 138 mmol/L (ref 135–145)
Total Bilirubin: 0.4 mg/dL (ref 0.0–1.2)
Total Protein: 5.2 g/dL — ABNORMAL LOW (ref 6.5–8.1)

## 2023-08-16 LAB — CBC
HCT: 39 % (ref 36.0–46.0)
Hemoglobin: 11.1 g/dL — ABNORMAL LOW (ref 12.0–15.0)
MCH: 23.8 pg — ABNORMAL LOW (ref 26.0–34.0)
MCHC: 28.5 g/dL — ABNORMAL LOW (ref 30.0–36.0)
MCV: 83.7 fL (ref 80.0–100.0)
Platelets: 179 10*3/uL (ref 150–400)
RBC: 4.66 MIL/uL (ref 3.87–5.11)
RDW: 16.7 % — ABNORMAL HIGH (ref 11.5–15.5)
WBC: 8.5 10*3/uL (ref 4.0–10.5)
nRBC: 0 % (ref 0.0–0.2)

## 2023-08-16 MED ORDER — AMOXICILLIN-POT CLAVULANATE 875-125 MG PO TABS
1.0000 | ORAL_TABLET | Freq: Two times a day (BID) | ORAL | Status: AC
  Administered 2023-08-16 – 2023-08-17 (×4): 1 via ORAL
  Filled 2023-08-16 (×4): qty 1

## 2023-08-16 MED ORDER — MAGNESIUM SULFATE 2 GM/50ML IV SOLN
2.0000 g | Freq: Once | INTRAVENOUS | Status: AC
  Administered 2023-08-16: 2 g via INTRAVENOUS
  Filled 2023-08-16: qty 50

## 2023-08-16 MED ORDER — PREDNISONE 20 MG PO TABS
40.0000 mg | ORAL_TABLET | Freq: Every day | ORAL | Status: AC
  Administered 2023-08-16 – 2023-08-18 (×3): 40 mg via ORAL
  Filled 2023-08-16 (×3): qty 2

## 2023-08-16 MED ORDER — POTASSIUM CHLORIDE CRYS ER 20 MEQ PO TBCR
40.0000 meq | EXTENDED_RELEASE_TABLET | Freq: Three times a day (TID) | ORAL | Status: AC
  Administered 2023-08-16 (×2): 40 meq via ORAL
  Filled 2023-08-16 (×3): qty 2

## 2023-08-16 NOTE — Plan of Care (Signed)
   Problem: Education: Goal: Knowledge of General Education information will improve Description: Including pain rating scale, medication(s)/side effects and non-pharmacologic comfort measures Outcome: Progressing   Problem: Coping: Goal: Level of anxiety will decrease Outcome: Progressing   Problem: Safety: Goal: Ability to remain free from injury will improve Outcome: Progressing

## 2023-08-16 NOTE — Progress Notes (Signed)
 [facility] PROGRESS NOTE  Elizabeth Cherry (DOB: 1956/04/27) [id number] PCP: Rebekah Chesterfield, NP  Brief Narrative: Elizabeth Cherry is a 68 y.o. female with a past medical history of COPD, asthma, right breast cancer, fibromyalgia, GERD, tobacco dependence admitted on 08/07/2023 with acute metabolic encephalopathy likely secondary to hypercapnia, acute on chronic hypoxic and hypercapnic respiratory failure, and AECOPD. She was treated with BiPAP with subsequent improvement, transition to nasal cannula, and ultimately transferred to the floor. After declining nocturnal BiPAP, she suffered worsening obtundation and respiratory acidosis prompting return to ICU on 3/26. Palliative care discussions are ongoing.   Subjective: Refused BiPAP overnight, breathing fine, has no complaints this morning, thanks me for stopping by.   Objective: BP 125/66   Pulse (!) 101   Temp 97.7 F (36.5 C) (Oral)   Resp 20   Ht 5\' 6"  (1.676 m)   Wt 57.6 kg   SpO2 94%   BMI 20.50 kg/m   Gen: Frail thin female in no acute distress laying in bed Pulm: Diminished but without wheezes or crackles, nonlabored mildly tachypneic.   CV: RRR, no MRG or edema GI: Soft, NT, ND, +BS  Neuro: Alert and oriented. No new focal deficits. Ext: Warm, no deformities, sarcopenic. Skin: No new rashes, lesions or ulcers on visualized skin   Assessment & Plan: Acute metabolic encephalopathy: Likely secondary to hypercapnia.  Appears resolved, though can be expected to recur if underlying problem is allowed to recur.   Acute hypoxic and acute on chronic hypercapnic respiratory failure: On presentation ABG 7.29/95/32.  Initially placed on BiPAP, weaned to nasal cannula at 2-3 LPM, no O2 requirement PTA. On 3/26 had lethargy dur to recurrent hypercarbic respiratory failure treated successfully with BiPAP. We are continuing monitoring in SDU.  - Weaned to nasal cannula, 2L O2, she remains at very high risk of hypercarbia  when resting and not using BiPAP. Goal SpO2 90-95% (avoid over supplementation).   - Continue acetazolamide to stimulate respiratory drive.    Acute COPD exacerbation/asthma:  - Completed steroid burst x3 days and empiric levaquin x5 days (3/20 - 3/24). Restarted steroid and antibiotic with opacity on CTA chest (no PE). PCT reassuring. Will convert to daily steroid and to augmentin to both complete 5 days.  - Continue dulera, singulair and prn duoneb.      Tobacco use:  - Continue nicotine patch prn (hasn't gotten this yet) and cessation counseling.   Hypertension:  - Continue prn hydralazine   Generalized anxiety disorder - Continue seroquel, sertraline, clonazepam   Hypokalemia:  - Supplement more aggressively today and give empiric Mg, monitor  Physical debilitation and muscle weakness, failure to thrive, goals of care counseling: Progressive impairment in functional status over the course of months per caretaker. Pt has also reported that she would not want intubation or resuscitation.  - Continue DNR/DNI status, if improvement continues, then would consider longitudinal palliative care involvement.  - I am discussing regularly with the patient the recommendation that she receive PT at SNF prior to attempting return home. She will consider this. Lives alone, neighbor checks on her but cannot offer 24 hours supervision/assistance.  - If she deteriorates further, would need to touch base again with HCPOA and likely convert to comfort measures per discussion with medical proxy.  - Appreciate palliative care discussions with pt/decision makers  Constipation: Abd benign. Poor po intake contributing. - Continue regimen.   GERD:  - PPI  Concern for sepsis due to pneumonia: Resolved. Dyspnea, tachypnea, leukocytosis,  possible pneumonia, on presentation give concern for sepsis.  Blood cultures NGTD.   Tyrone Nine, MD [facility] [email or web address] 08/16/2023, 8:12 AM

## 2023-08-17 DIAGNOSIS — J9601 Acute respiratory failure with hypoxia: Secondary | ICD-10-CM | POA: Diagnosis not present

## 2023-08-17 LAB — BASIC METABOLIC PANEL WITH GFR
Anion gap: 8 (ref 5–15)
BUN: 15 mg/dL (ref 8–23)
CO2: 32 mmol/L (ref 22–32)
Calcium: 8.3 mg/dL — ABNORMAL LOW (ref 8.9–10.3)
Chloride: 100 mmol/L (ref 98–111)
Creatinine, Ser: 0.63 mg/dL (ref 0.44–1.00)
GFR, Estimated: >60 mL/min (ref >60)
Glucose, Bld: 104 mg/dL — ABNORMAL HIGH (ref 70–99)
Potassium: 3 mmol/L — ABNORMAL LOW (ref 3.5–5.1)
Sodium: 140 mmol/L (ref 135–145)

## 2023-08-17 LAB — MAGNESIUM: Magnesium: 2.3 mg/dL (ref 1.7–2.4)

## 2023-08-17 MED ORDER — POTASSIUM CHLORIDE 20 MEQ PO PACK: 40.0000 meq | PACK | Freq: Once | ORAL | Status: DC

## 2023-08-17 MED ORDER — POTASSIUM CHLORIDE CRYS ER 20 MEQ PO TBCR
40.0000 meq | EXTENDED_RELEASE_TABLET | Freq: Three times a day (TID) | ORAL | Status: DC
  Administered 2023-08-17 (×2): 40 meq via ORAL
  Filled 2023-08-17 (×2): qty 2

## 2023-08-17 MED ORDER — POTASSIUM CHLORIDE 20 MEQ PO PACK
40.0000 meq | PACK | Freq: Once | ORAL | Status: AC
  Administered 2023-08-17: 40 meq via ORAL
  Filled 2023-08-17: qty 2

## 2023-08-17 NOTE — Progress Notes (Addendum)
 [facility] PROGRESS NOTE  Elizabeth Cherry (DOB: 02/27/1956) MWN:[id number] PCP: Rebekah Chesterfield, NP  Brief Narrative: Elizabeth Cherry is a 68 y.o. female with a past medical history of COPD, asthma, right breast cancer, fibromyalgia, GERD, tobacco dependence admitted on 08/07/2023 with acute metabolic encephalopathy likely secondary to hypercapnia, acute on chronic hypoxic and hypercapnic respiratory failure, and AECOPD. She was treated with BiPAP with subsequent improvement, transition to nasal cannula, and ultimately transferred to the floor. After declining nocturnal BiPAP, she suffered worsening obtundation and respiratory acidosis prompting return to ICU on 3/26. Palliative care discussions are ongoing.   Subjective: Says her breathing is near her baseline. She was quite weak with therapy yesterday, required assistance for peri care, and is more amenable to going to rehabilitation from hospital prior to returning home.   Objective: BP 137/77   Pulse 99   Temp 98.3 F (36.8 C) (Oral)   Resp 19   Ht 5\' 6"  (1.676 m)   Wt 57.6 kg   SpO2 (!) 89%   BMI 20.50 kg/m   Gen: Frail female leaning forward in bed without supplemental oxygen on Pulm: Tachypneic but nonlabored, no wheezes or crackles. SpO2 89%, but with deep breathing comes up to 94%. CV: RRR, no edema GI: Soft, NT, ND, +BS  Neuro: Alert and oriented. No new focal deficits. Ext: Warm, no deformities. Skin: No new rashes, lesions or ulcers on visualized skin   Assessment & Plan: Acute metabolic encephalopathy: Likely secondary to hypercapnia.  Appears resolved, though can be expected to recur if underlying problem is allowed to recur.   Acute hypoxic and acute on chronic hypercapnic respiratory failure: On presentation ABG 7.29/95/32.  Initially placed on BiPAP, weaned to nasal cannula at 2-3 LPM, no O2 requirement PTA. On 3/26 had lethargy dur to recurrent hypercarbic respiratory failure treated successfully with  BiPAP. We are continuing monitoring in SDU.  - Weaned to nasal cannula, 2L O2, she remains at very high risk of hypercarbia when resting and not using BiPAP. Goal SpO2 90-95% (avoid over supplementation). She is nonadherent with BiPAP recommendations and at times with supplemental oxygen. - Urge to use incentive spirometry.  - Continue acetazolamide to stimulate respiratory drive.    Acute COPD exacerbation/asthma:  - Completed steroid burst x3 days and empiric levaquin x5 days (3/20 - 3/24). Restarted steroid and antibiotic with opacity on CTA chest (no PE). PCT reassuring. Will complete 5 days of prednisone and augmentin (3/27 - 3/31). - Continue dulera, singulair and prn duoneb.      Tobacco use: Heavy use - Continue cessation counseling.   Hypertension:  - Continue prn hydralazine   Generalized anxiety disorder - Continue seroquel, sertraline, clonazepam   Hypokalemia:  - Supplement more aggressively today and give empiric Mg, monitor  Physical debilitation and muscle weakness, failure to thrive, goals of care counseling: Progressive impairment in functional status over the course of months per caretaker. Pt has also reported that she would not want intubation or resuscitation.  - Continue DNR/DNI status, if improvement continues, then would consider longitudinal palliative care involvement.  - Patient seems more amenable to pursuing SNF prior to attempting return home. Lives alone, neighbor checks on her but cannot offer 24 hours supervision/assistance. Will engage TOC to initiate bed search. - If she deteriorates further, would need to touch base again with HCPOA and likely convert to comfort measures per discussion with medical proxy.  - Appreciate palliative care discussions with pt/decision makers  Hypokalemia:  - K supplement  Constipation: Abd benign. Poor po intake contributing. - Continue regimen.   GERD:  - PPI  Concern for sepsis due to pneumonia: Resolved. Dyspnea,  tachypnea, leukocytosis, possible pneumonia, on presentation give concern for sepsis.  Blood cultures NGTD.   Tyrone Nine, MD [facility] [email or web address] 08/17/2023, 9:00 AM

## 2023-08-18 DIAGNOSIS — Z7189 Other specified counseling: Secondary | ICD-10-CM | POA: Diagnosis not present

## 2023-08-18 DIAGNOSIS — J9601 Acute respiratory failure with hypoxia: Secondary | ICD-10-CM | POA: Diagnosis not present

## 2023-08-18 DIAGNOSIS — Z515 Encounter for palliative care: Secondary | ICD-10-CM | POA: Diagnosis not present

## 2023-08-18 NOTE — Progress Notes (Signed)
 DOB: 1955-10-16 Date: 08/18/2023   Must ID: [id number]   To Whom it May Concern:  Please be advised that the above named patient will require a short-term nursing home stay- anticipated 30 days or less rehabilitation and strengthening. The plan is for return home.

## 2023-08-18 NOTE — Progress Notes (Signed)
 Mobility Specialist Progress Note:   08/18/23 1533  Therapy Vitals  Temp 98.1 F (36.7 C)  Temp Source Oral  Pulse Rate (!) 116  BP (!) 154/88  Patient Position (if appropriate) Sitting  Oxygen Therapy  SpO2 93 %  O2 Device Room Air  Mobility  Activity Transferred to/from [facility]  Level of Assistance Contact guard assist, steadying assist  Assistive Device None  Distance Ambulated (ft) 4 ft  Range of Motion/Exercises Active;All extremities  Activity Response Tolerated well  Mobility visit 1 Mobility  Mobility Specialist Start Time (ACUTE ONLY) 1520  Mobility Specialist Stop Time (ACUTE ONLY) 1540  Mobility Specialist Time Calculation (min) (ACUTE ONLY) 20 min   Pt received requesting assistance to [facility]. Required CGA to stand and transfer with no AD. Tolerated well, asx throughout. Returned pt supine, NT took vitals after transfer. Alarm on, all needs met.  Lawerance Bach Mobility Specialist Please contact via Special educational needs teacher or  Rehab office at [phone]

## 2023-08-18 NOTE — [place] FL2 (Signed)
 [place] MEDICAID FL2 LEVEL OF CARE FORM     IDENTIFICATION  Patient Name: Elizabeth Cherry Birthdate: [birth year] Sex: female Admission Date (Current Location): 08/07/2023  [place] and [place] Number:  [employer] and Address:  [facility],  [address] [place] [address]      Provider Number: [id number]  Attending Physician Name and Address:  Tyrone Nine, MD  Relative Name and Phone Number:       Current Level of Care: Hospital Recommended Level of Care: Skilled Nursing Facility Prior Approval Number:    Date Approved/Denied:   PASRR Number: pending  Discharge Plan:      Current Diagnoses: Patient Active Problem List   Diagnosis Date Noted   Acute hypoxic respiratory failure (HCC) 08/07/2023   Vitamin D deficiency 03/02/2020   Statin myopathy 02/03/2019   Hyperlipidemia 12/08/2018   Breast cancer of upper-inner quadrant of right female breast (HCC) 01/08/2016   Tobacco use disorder 10/24/2015   Benign neoplasm of colon 10/24/2015   Alcohol use disorder, severe, in sustained remission (HCC) 10/24/2015   Borderline personality disorder (HCC) 10/24/2015   Severe recurrent major depression without psychotic features (HCC) 10/23/2015   Hypertension 10/23/2015   Self neglect 10/23/2015   Psoriasis 02/11/2012   Chronic obstructive pulmonary disease (HCC) 11/15/2011   Fibromyalgia 11/15/2011   Chronic pain 10/24/2011    Orientation RESPIRATION BLADDER Height & Weight     Self, Time, Situation, Place  O2 (2L) Continent Weight: 126 lb 15.8 oz (57.6 kg) Height:  5\' 6"  (167.6 cm)  BEHAVIORAL SYMPTOMS/MOOD NEUROLOGICAL BOWEL NUTRITION STATUS      Continent Diet (See d/c summary)  AMBULATORY STATUS COMMUNICATION OF NEEDS Skin   Extensive Assist Verbally Bruising                       Personal Care Assistance Level of Assistance  Bathing, Feeding, Dressing Bathing Assistance: Maximum assistance Feeding assistance: Limited  assistance Dressing Assistance: Maximum assistance     Functional Limitations Info  Sight, Hearing, Speech Sight Info: Impaired Hearing Info: Adequate Speech Info: Adequate    SPECIAL CARE FACTORS FREQUENCY  PT (By licensed PT), OT (By licensed OT)     PT Frequency: 5x weekly OT Frequency: 5x weekly            Contractures      Additional Factors Info  Code Status, Allergies, Psychotropic Code Status Info: DNR- Limited- do not intubate Allergies Info: Bactrim (Sulfamethoxazole-trimethoprim)  Diazepam  Fluoxetine  Telmisartan  Tetracycline  Tetracyclines & Related  Aspirin  Cefdinir  Clarithromycin  Nsaids  Risperidone  Trazodone  Atorvastatin  Oxycodone  Praluent (Alirocumab)  Pravastatin  Repatha (Evolocumab)  Rosuvastatin Psychotropic Info: Seroquel, Klonopin, Zoloft         Current Medications (08/18/2023):  This is the current hospital active medication list Current Facility-Administered Medications  Medication Dose Route Frequency Provider Last Rate Last Admin   acetaminophen (TYLENOL) tablet 650 mg  650 mg Oral Q6H PRN Deanna Artis, DO   650 mg at 08/12/23 1508   Or   acetaminophen (TYLENOL) suppository 650 mg  650 mg Rectal Q6H PRN Deanna Artis, DO       acetaZOLAMIDE ER (DIAMOX) 12 hr capsule 500 mg  500 mg Oral Q12H Toni Amend W, DO   500 mg at 08/18/23 6440   Chlorhexidine Gluconate Cloth 2 % PADS 6 each  6 each Topical Daily Tyrone Nine, MD   6  each at 08/18/23 0843   clonazePAM (KLONOPIN) tablet 0.5 mg  0.5 mg Oral TID PRN Deanna Artis, DO   0.5 mg at 08/17/23 2141   enoxaparin (LOVENOX) injection 40 mg  40 mg Subcutaneous Q24H Deanna Artis, DO   40 mg at 08/17/23 2143   feeding supplement (ENSURE ENLIVE / ENSURE PLUS) liquid 237 mL  237 mL Oral BID BM Tyrone Nine, MD   237 mL at 08/15/23 1005   fluconazole (DIFLUCAN) tablet 100 mg  100 mg Oral Daily Deanna Artis, DO   100 mg at 08/18/23 2130   hydrALAZINE (APRESOLINE) injection 10 mg   10 mg Intravenous Q6H PRN Deanna Artis, DO   10 mg at 08/07/23 1453   HYDROcodone-acetaminophen (NORCO/VICODIN) 5-325 MG per tablet 1-2 tablet  1-2 tablet Oral Q4H PRN Deanna Artis, DO   1 tablet at 08/16/23 1656   ipratropium-albuterol (DUONEB) 0.5-2.5 (3) MG/3ML nebulizer solution 3 mL  3 mL Nebulization Q6H PRN Deanna Artis, DO   3 mL at 08/17/23 1013   mometasone-formoterol (DULERA) 100-5 MCG/ACT inhaler 2 puff  2 puff Inhalation BID Deanna Artis, DO   2 puff at 08/18/23 0744   montelukast (SINGULAIR) tablet 10 mg  10 mg Oral QHS Deanna Artis, DO   10 mg at 08/17/23 2142   morphine (PF) 2 MG/ML injection 2 mg  2 mg Intravenous Q2H PRN Toni Amend W, DO       nicotine (NICODERM CQ - dosed in mg/24 hours) patch 21 mg  21 mg Transdermal Daily PRN Deanna Artis, DO       Oral care mouth rinse  15 mL Mouth Rinse 4 times per day Tyrone Nine, MD   15 mL at 08/18/23 8657   Oral care mouth rinse  15 mL Mouth Rinse PRN Tyrone Nine, MD       pantoprazole (PROTONIX) EC tablet 40 mg  40 mg Oral Daily Toni Amend W, DO   40 mg at 08/18/23 0841   polyethylene glycol (MIRALAX / GLYCOLAX) packet 17 g  17 g Oral Daily Tyrone Nine, MD   17 g at 08/18/23 0841   QUEtiapine (SEROQUEL) tablet 100 mg  100 mg Oral QHS Toni Amend W, DO   100 mg at 08/17/23 2141   senna-docusate (Senokot-S) tablet 2 tablet  2 tablet Oral BID Tyrone Nine, MD   2 tablet at 08/18/23 0847   sertraline (ZOLOFT) tablet 100 mg  100 mg Oral Daily Deanna Artis, DO   100 mg at 08/18/23 0841   sodium chloride flush (NS) 0.9 % injection 3 mL  3 mL Intravenous Q12H Deanna Artis, DO   3 mL at 08/18/23 0846   traMADol (ULTRAM) tablet 50 mg  50 mg Oral BID Deanna Artis, DO   50 mg at 08/18/23 8469     Discharge Medications: Please see discharge summary for a list of discharge medications.  Relevant Imaging Results:  Relevant Lab Results:   Additional Information SSN: [id number]  Karn Cassis, LCSW

## 2023-08-18 NOTE — Progress Notes (Signed)
 [facility] PROGRESS NOTE  Elizabeth Cherry (DOB: June 07, 1955) UJW:[id number] PCP: Rebekah Chesterfield, NP  Brief Narrative: Elizabeth Cherry is a 68 y.o. female with a past medical history of COPD, asthma, right breast cancer, fibromyalgia, GERD, tobacco dependence admitted on 08/07/2023 with acute metabolic encephalopathy likely secondary to hypercapnia, acute on chronic hypoxic and hypercapnic respiratory failure, and AECOPD. She was treated with BiPAP with subsequent improvement, transition to nasal cannula, and ultimately transferred to the floor. After declining nocturnal BiPAP, she suffered worsening obtundation and respiratory acidosis prompting return to ICU on 3/26. Palliative care discussions are ongoing.   Subjective: No BiPAP again last night, feels her breathing is fine. Hasn't been eating, breakfast untouched at bedside. Says she's considering rehab.  Objective: BP (!) 142/73   Pulse (!) 102   Temp 97.9 F (36.6 C) (Oral)   Resp 19   Ht 5\' 6"  (1.676 m)   Wt 57.6 kg   SpO2 99%   BMI 20.50 kg/m   Gen: No distress, frail Pulm: Diminished without wheezes or crackles  CV: Borderline tachycardia, regular, no MRG or edema GI: Soft, NT, ND, +BS Neuro: Alert and oriented. No new focal deficits. Ext: Warm, no deformities, decreased muscle bulk. Skin: No new rashes, lesions or ulcers on visualized skin   Assessment & Plan: Acute metabolic encephalopathy: Likely secondary to hypercapnia.  Appears resolved, though can be expected to recur if underlying problem is allowed to recur.   Acute hypoxic and acute on chronic hypercapnic respiratory failure: On presentation ABG 7.29/95/32.  Initially placed on BiPAP, weaned to nasal cannula at 2-3 LPM, no O2 requirement PTA. On 3/26 had lethargy dur to recurrent hypercarbic respiratory failure treated successfully with BiPAP. We are continuing monitoring in SDU.  - Weaned to nasal cannula, 2L O2, she remains at very high risk of  hypercarbia when resting and not using BiPAP. Goal SpO2 90-95% (avoid over supplementation). She is nonadherent with BiPAP recommendations, has not used in a couple days, so will transfer to medical floor.  - Urge to use incentive spirometry.  - Continue acetazolamide to stimulate respiratory drive.    Acute COPD exacerbation/asthma:  - Completed steroid burst x3 days and empiric levaquin x5 days (3/20 - 3/24). Restarted steroid and antibiotic with opacity on CTA chest (no PE). PCT reassuring. Will complete 5 days of prednisone and augmentin (3/27 - 3/31) today - Continue dulera, singulair and prn duoneb.      Tobacco use: Heavy use - Continue cessation counseling.  Hypochromic anemia: No bleeding currently.  - Recheck for stability with anemia panel in AM. Strongly suspect nutrition deficiency playing a role.    Hypertension:  - Continue prn hydralazine   Generalized anxiety disorder - Continue seroquel, sertraline, clonazepam   Hypokalemia:  - Supplement more aggressively today and give empiric Mg, monitor  Physical debilitation and muscle weakness, failure to thrive, goals of care counseling: Progressive impairment in functional status over the course of months per caretaker. Pt has also reported that she would not want intubation or resuscitation.  - Continue DNR/DNI status, if improvement continues, then would consider longitudinal palliative care involvement.  - Patient seems more amenable to pursuing SNF prior to attempting return home. Lives alone, neighbor checks on her but cannot offer 24 hours supervision/assistance. Will engage TOC to initiate bed search. If she denies SNF placement, we would maximize home health therapies, enlist family/community for maximal assistance as she possesses capacity to make medical decisions at this time. - If she  deteriorates further, would need to touch base again with HCPOA and likely convert to comfort measures per discussion with medical proxy.   - Appreciate palliative care discussions with pt/decision makers  Hypokalemia:  - K supplement ongoing, recheck in AM   Constipation: Abd benign. Poor po intake contributing. - Continue regimen.   GERD:  - PPI  Concern for sepsis due to pneumonia: Resolved. Dyspnea, tachypnea, leukocytosis, possible pneumonia, on presentation give concern for sepsis.  Blood cultures NGTD.   Tyrone Nine, MD [facility] [email or web address] 08/18/2023, 10:03 AM

## 2023-08-18 NOTE — Plan of Care (Signed)
   Problem: Education: Goal: Knowledge of General Education information will improve Description Including pain rating scale, medication(s)/side effects and non-pharmacologic comfort measures Outcome: Progressing   Problem: Education: Goal: Knowledge of General Education information will improve Description Including pain rating scale, medication(s)/side effects and non-pharmacologic comfort measures Outcome: Progressing

## 2023-08-18 NOTE — Progress Notes (Signed)
 Palliative:   Face-to-face with bedside nursing staff related to patient condition, needs.  Ms. Elizabeth, Cherry, is lying quietly in bed.  She appears chronically ill and frail.  She greets me, making and mostly keeping eye contact.  She is alert and oriented to person and place, but not month.  I do believe that she can make her basic needs known.  There is no family/friend at bedside at this time.  We talked about Ms. Elizabeth Cherry's acute health concerns and the treatment plan.  We talked about short-term rehab with ultimate goal of returning home.  Elizabeth Cherry was able to easily raise herself from a lying position to sitting position on the edge of the bed.  We talked about the benefit of outpatient hospice care.  Elizabeth Cherry states that she is agreeable to at home "treat the treatable" hospice care.   Call to [facility], Cleotilde Neer, as [name] is oriented to self and situation only today.  We talk in detail about my visit with Elizabeth Cherry earlier today.  We talked about continued plan for short-term rehab with ultimate goal of returning home.  Elizabeth Cherry states that neighbor, Elizabeth Cherry, is willing and able to help at home as needed, but that Elizabeth Cherry must be able to do some on her own.  We talked about "treat the treatable" hospice care at home.  Elizabeth Cherry readily agrees.  Provider choice offered, [facility].  Conference with attending, bedside nursing staff, transition of care team related to patient condition, needs, goals of care, disposition.  Plan: Continue to treat the treatable but no CPR or intubation.  Short-term rehab if possible, ultimate goal of returning home.  HCPOA and trustee are working for sitter/help at home.  At home "treat the treatable" hospice care, provider of choice [facility].      50 minutes  Elizabeth Carmel, NP Palliative Medicine Team  Team Phone [phone]

## 2023-08-18 NOTE — Progress Notes (Signed)
 Pt has adamantly refused to take her meds. Stated she does not want them, wants to sleep and be left alone. Charge nurse and on-call MD notified.

## 2023-08-18 NOTE — Progress Notes (Signed)
 Patient's heart rate up to 107-120's,Blood pressure 124/88. No c/o pain or discomfort noted at this time.Dr Jarvis Newcomer notified . Plan of care on going.

## 2023-08-18 NOTE — TOC Progression Note (Signed)
 Transition of Care [facility]) - Progression Note    Patient Details  Name: Elizabeth Cherry MRN: [id number] Date of Birth: 12/26/1955  Transition of [facility] CM/SW Contact  Karn Cassis, [place] Phone Number: 08/18/2023, 3:17 PM  Clinical Narrative:  LCSW discussed d/c plan with pt. She states she told therapy she didn't want to work with them this morning. Discussed SNF and need for PT recommendation for SNF if pt wanted to consider. However, pt states she wants to return home. She is agreeable to [facility] referral which was plan following SNF. LCSW made referral and notified Rae Halsted and [place] at [place]. [facility] to order DME.      Expected Discharge Plan: Home w Home Health Services Barriers to Discharge: Continued Medical Work up  Expected Discharge Plan and Services In-house Referral: Clinical Social Work Discharge Planning Services: CM Consult   Living arrangements for the past 2 months: Single Family Home                                       Social Determinants of Health (SDOH) Interventions SDOH Screenings   Depression (PHQ2-9): Low Risk  (02/08/2020)  Social Connections: Unknown (10/01/2021)   [facility] from [facility]  Tobacco Use: High Risk (08/13/2023)    Readmission Risk Interventions    08/18/2023    7:50 AM  Readmission Risk Prevention Plan  Transportation Screening Complete  HRI or Home Care Consult Complete  Social Work Consult for Recovery Care Planning/Counseling Complete  Palliative Care Screening Not Applicable  Medication Review [employer]) Complete

## 2023-08-19 DIAGNOSIS — Z7189 Other specified counseling: Secondary | ICD-10-CM | POA: Diagnosis not present

## 2023-08-19 DIAGNOSIS — J9601 Acute respiratory failure with hypoxia: Secondary | ICD-10-CM | POA: Diagnosis not present

## 2023-08-19 DIAGNOSIS — Z515 Encounter for palliative care: Secondary | ICD-10-CM | POA: Diagnosis not present

## 2023-08-19 LAB — VITAMIN B12: Vitamin B-12: 353 pg/mL (ref 180–914)

## 2023-08-19 LAB — CBC
HCT: 45.2 % (ref 36.0–46.0)
Hemoglobin: 12.9 g/dL (ref 12.0–15.0)
MCH: 23 pg — ABNORMAL LOW (ref 26.0–34.0)
MCHC: 28.5 g/dL — ABNORMAL LOW (ref 30.0–36.0)
MCV: 80.6 fL (ref 80.0–100.0)
Platelets: 242 10*3/uL (ref 150–400)
RBC: 5.61 MIL/uL — ABNORMAL HIGH (ref 3.87–5.11)
RDW: 17.2 % — ABNORMAL HIGH (ref 11.5–15.5)
WBC: 12.1 10*3/uL — ABNORMAL HIGH (ref 4.0–10.5)
nRBC: 0 % (ref 0.0–0.2)

## 2023-08-19 LAB — RETICULOCYTES
Immature Retic Fract: 8.6 % (ref 2.3–15.9)
RBC.: 5.54 MIL/uL — ABNORMAL HIGH (ref 3.87–5.11)
Retic Count, Absolute: 38.8 10*3/uL (ref 19.0–186.0)
Retic Ct Pct: 0.7 % (ref 0.4–3.1)

## 2023-08-19 LAB — BASIC METABOLIC PANEL WITH GFR
Anion gap: 6 (ref 5–15)
BUN: 15 mg/dL (ref 8–23)
CO2: 30 mmol/L (ref 22–32)
Calcium: 8.7 mg/dL — ABNORMAL LOW (ref 8.9–10.3)
Chloride: 101 mmol/L (ref 98–111)
Creatinine, Ser: 0.56 mg/dL (ref 0.44–1.00)
GFR, Estimated: >60 mL/min (ref >60)
Glucose, Bld: 109 mg/dL — ABNORMAL HIGH (ref 70–99)
Potassium: 3.3 mmol/L — ABNORMAL LOW (ref 3.5–5.1)
Sodium: 137 mmol/L (ref 135–145)

## 2023-08-19 LAB — IRON AND TIBC
Iron: 68 ug/dL (ref 28–170)
Saturation Ratios: 20 % (ref 10.4–31.8)
TIBC: 335 ug/dL (ref 250–450)
UIBC: 267 ug/dL

## 2023-08-19 LAB — FERRITIN: Ferritin: 241 ng/mL (ref 11–307)

## 2023-08-19 LAB — FOLATE: Folate: 4 ng/mL — ABNORMAL LOW (ref >5.9)

## 2023-08-19 MED ORDER — POTASSIUM CHLORIDE CRYS ER 20 MEQ PO TBCR
40.0000 meq | EXTENDED_RELEASE_TABLET | Freq: Once | ORAL | Status: AC
  Administered 2023-08-19: 40 meq via ORAL
  Filled 2023-08-19: qty 2

## 2023-08-19 MED ORDER — TAB-A-VITE/IRON PO TABS
1.0000 | ORAL_TABLET | Freq: Every day | ORAL | Status: DC
Start: 2023-08-19 — End: 2023-08-20
  Administered 2023-08-19 – 2023-08-20 (×2): 1 via ORAL
  Filled 2023-08-19 (×2): qty 1

## 2023-08-19 MED ORDER — FOLIC ACID 1 MG PO TABS
1.0000 mg | ORAL_TABLET | Freq: Every day | ORAL | Status: DC
  Administered 2023-08-19 – 2023-08-20 (×2): 1 mg via ORAL
  Filled 2023-08-19 (×2): qty 1

## 2023-08-19 NOTE — Progress Notes (Signed)
 Mobility Specialist Progress Note:    08/19/23 0935  Mobility  Activity Transferred to/from [facility]  Level of Assistance Contact guard assist, steadying assist  Assistive Device BSC;None  Distance Ambulated (ft) 5 ft  Range of Motion/Exercises Active;All extremities  Activity Response Tolerated well  Mobility visit 1 Mobility  Mobility Specialist Start Time (ACUTE ONLY) 0935  Mobility Specialist Stop Time (ACUTE ONLY) 0945  Mobility Specialist Time Calculation (min) (ACUTE ONLY) 10 min   Pt received requesting assistance to [facility]. Required CGA to stand and transfer with no AD. Tolerated well, asx throughout. Returned pt supine, alarm on. All needs met.  Lawerance Bach Mobility Specialist Please contact via Special educational needs teacher or  Rehab office at [phone]

## 2023-08-19 NOTE — Progress Notes (Signed)
 Palliative:   Ms. Rain, [place], is sitting up on the bed in her room.  She appears acutely/chronically ill and somewhat frail.  She greets me, making and somewhat keeping eye contact.  She is alert and oriented, able to make her needs known.  There is no family at bedside at this time.  We talked about her discharge plan.  Johnelle continues to state that although she is weak, she feels that she can return home with help from her neighbor Morrie Sheldon and with paid caregivers.  She continues to be agreeable to "treat the treatable" hospice care.  Conference with attending, bedside nursing staff, transition of care team related to patient condition, needs, goals of care, disposition.    Plan:   Continue to treat the treatable but no CPR or intubation.  Home with "treat the treatable" hospice care with [facility].  DNR/goldenrod form completed and placed on chart.       35 minutes  Lillia Carmel, NP Palliative medicine team Team phone [phone]

## 2023-08-19 NOTE — Plan of Care (Signed)
   Problem: Safety: Goal: Ability to remain free from injury will improve Outcome: Progressing   Problem: Skin Integrity: Goal: Risk for impaired skin integrity will decrease Outcome: Progressing

## 2023-08-19 NOTE — Plan of Care (Signed)
  Problem: Education: Goal: Knowledge of General Education information will improve Description: Including pain rating scale, medication(s)/side effects and non-pharmacologic comfort measures Outcome: Progressing   Problem: Health Behavior/Discharge Planning: Goal: Ability to manage health-related needs will improve Outcome: Progressing   Problem: Clinical Measurements: Goal: Ability to maintain clinical measurements within normal limits will improve Outcome: Progressing Goal: Will remain free from infection Outcome: Progressing Goal: Diagnostic test results will improve Outcome: Progressing Goal: Cardiovascular complication will be avoided Outcome: Progressing   Problem: Activity: Goal: Risk for activity intolerance will decrease Outcome: Progressing   Problem: Pain Managment: Goal: General experience of comfort will improve and/or be controlled Outcome: Progressing   Problem: Safety: Goal: Ability to remain free from injury will improve Outcome: Progressing   Problem: Skin Integrity: Goal: Risk for impaired skin integrity will decrease Outcome: Progressing   Problem: Education: Goal: Knowledge of disease or condition will improve Outcome: Progressing   Problem: Clinical Measurements: Goal: Ability to maintain a body temperature in the normal range will improve Outcome: Progressing   Problem: Respiratory: Goal: Ability to maintain adequate ventilation will improve Outcome: Progressing

## 2023-08-19 NOTE — Plan of Care (Signed)
   Problem: Education: Goal: Knowledge of General Education information will improve Description Including pain rating scale, medication(s)/side effects and non-pharmacologic comfort measures Outcome: Progressing   Problem: Education: Goal: Knowledge of General Education information will improve Description Including pain rating scale, medication(s)/side effects and non-pharmacologic comfort measures Outcome: Progressing

## 2023-08-19 NOTE — TOC Progression Note (Addendum)
 Transition of Care [facility]) - Progression Note    Patient Details  Name: Elizabeth Cherry MRN: [id number] Date of Birth: 06-05-1955  Transition of Care [facility]) CM/SW Contact  Karn Cassis, [place] Phone Number: 08/19/2023, 11:13 AM  Clinical Narrative: Pt's HCPOA, Elizabeth Cherry called LCSW with concern that she thought plan was to go to SNF at d/c. LCSW spoke with MD who feels pt has capacity. LCSW discussed with Elizabeth Cherry that if pt has capacity, she can make all decisions. Elizabeth Cherry reports understanding. Pt continues to refuse SNF. Elizabeth Cherry indicates pt's neighbor, Elizabeth Cherry will need until tomorrow to get house ready for pt to return. Elizabeth Cherry with [employer] notified to call Elizabeth Cherry regarding needed DME.       Expected Discharge Plan: Home w Home Health Services Barriers to Discharge: Continued Medical Work up  Expected Discharge Plan and Services In-house Referral: Clinical Social Work Discharge Planning Services: CM Consult   Living arrangements for the past 2 months: Single Family Home                                       Social Determinants of Health (SDOH) Interventions SDOH Screenings   Depression (PHQ2-9): Low Risk  (02/08/2020)  Social Connections: Unknown (10/01/2021)   Received from [facility], [facility]  Tobacco Use: High Risk (08/13/2023)    Readmission Risk Interventions    08/18/2023    7:50 AM  Readmission Risk Prevention Plan  Transportation Screening Complete  HRI or Home Care Consult Complete  Social Work Consult for Recovery Care Planning/Counseling Complete  Palliative Care Screening Not Applicable  Medication Review Oceanographer) Complete

## 2023-08-19 NOTE — Progress Notes (Signed)
 [facility] PROGRESS NOTE  Elizabeth Cherry (DOB: 1956/02/23) ZOX:[id number] PCP: Rebekah Chesterfield, NP  Brief Narrative: Elizabeth Cherry is a 68 y.o. female with a past medical history of COPD, asthma, right breast cancer, fibromyalgia, GERD, tobacco dependence admitted on 08/07/2023 with acute metabolic encephalopathy likely secondary to hypercapnia, acute on chronic hypoxic and hypercapnic respiratory failure, and AECOPD. She was treated with BiPAP with subsequent improvement, transition to nasal cannula, and ultimately transferred to the floor. After declining nocturnal BiPAP, she suffered worsening obtundation and respiratory acidosis prompting return to ICU on 3/26. Palliative care discussions are ongoing.   Subjective: Alert and interactive this morning, wants to go home. Denies any physical complaint. Irritated by sounds at nurses station.   Objective: BP 126/83 (BP Location: Right Arm)   Pulse (!) 105   Temp 97.6 F (36.4 C) (Oral)   Resp 17   Ht 5\' 6"  (1.676 m)   Wt 57.6 kg   SpO2 90%   BMI 20.50 kg/m   Gen: Frail female in no distress Pulm: Diminished without crackles or wheezes. Nonlabored.  CV: RRR, no MRG GI: Soft, NT, ND, +BS  Neuro: Alert and oriented. No new focal deficits. Ext: Warm, no deformities Skin: No rashes, lesions or ulcers on visualized skin   Assessment & Plan: Acute metabolic encephalopathy: Likely secondary to hypercapnia.  Appears resolved, though can be expected to recur if underlying problem is allowed to recur. - Pt demonstrates the ability to cite benefits and risks of medical treatments including BiPAP as well as disposition options (namely SNF vs. home). She is preoccupied with returning home and feels she'd rather return home with hospice care than attempt rehabilitation at SNF. Her HCPOA as well as her neighbor who is de Oncologist are aware and understand. Arrangements to maximize safety at home are underway for discharge with  home hospice 4/2.    Acute hypoxic and acute on chronic hypercapnic respiratory failure: On presentation ABG 7.29/95/32.  Initially placed on BiPAP, weaned to nasal cannula at 2-3 LPM, no O2 requirement PTA. On 3/26 had lethargy dur to recurrent hypercarbic respiratory failure treated successfully with BiPAP. We are continuing monitoring in SDU.  - Weaned to nasal cannula, 2L O2, she remains at very high risk of hypercarbia when resting and not using BiPAP. Goal SpO2 90-95% (avoid over supplementation). She is nonadherent with BiPAP recommendations, our plan will be to discharge with home hospice services. - Urged to use incentive spirometry.  - Continue acetazolamide to stimulate respiratory drive.    Acute COPD exacerbation/asthma:  - Completed steroid burst x3 days and empiric levaquin x5 days (3/20 - 3/24). Restarted steroid and antibiotic with opacity on CTA chest (no PE) and completed 5 days of prednisone and augmentin (3/27 - 3/31)  - Continue dulera, singulair and prn duoneb.      Tobacco use: Heavy use - Continue cessation counseling.  Hypochromic anemia: No bleeding currently. Stable hgb on recheck.  - Start multivitamin with iron. Strongly suspect nutrition deficiency playing a role.   Folic acid deficiency:  - Supplement.   Hypertension:  - Continue prn hydralazine   Generalized anxiety disorder: - Continue seroquel, sertraline, clonazepam   Hypokalemia:  - Repeat supplement    Physical debilitation and muscle weakness, failure to thrive, goals of care counseling: Progressive impairment in functional status over the course of months per caretaker. Pt has also reported that she would not want intubation or resuscitation.  - Continue DNR/DNI status, if improvement continues, then  plan to DC home with hospice.   - If she deteriorates further, would need to touch base again with HCPOA and likely convert to comfort measures per discussion with medical proxy.  - Appreciate  palliative care discussions with pt/decision makers   Constipation: Abd benign. Poor po intake contributing. - Continue regimen.   GERD:  - PPI  Concern for sepsis due to pneumonia: Resolved. Dyspnea, tachypnea, leukocytosis, possible pneumonia, on presentation give concern for sepsis.  Blood cultures NGTD.   Tyrone Nine, MD [facility] [email or web address] 08/19/2023, 12:05 PM

## 2023-08-19 NOTE — Progress Notes (Signed)
 Nurse at bedside,patient alert to self,and situation,also time,confused to place.Oxygen off this am room air saturation was eighty nine percent,oxygen applied via nasal canula at 2 liters,patient's oxygen level now at 90 percent.Plan of care on going.

## 2023-08-19 NOTE — Progress Notes (Signed)
 Mobility Specialist Progress Note:    08/19/23 1035  Mobility  Activity Transferred to/from [facility]  Level of Assistance Contact guard assist, steadying assist  Assistive Device BSC;None  Distance Ambulated (ft) 5 ft  Range of Motion/Exercises Active;All extremities  Activity Response Tolerated well  Mobility visit 1 Mobility  Mobility Specialist Start Time (ACUTE ONLY) 1025  Mobility Specialist Stop Time (ACUTE ONLY) 1038  Mobility Specialist Time Calculation (min) (ACUTE ONLY) 13 min   Pt received resting assistance to [facility]. Required CGA to stand and transfer with no AD. Tolerated well asx throughout. Left pt in room with nurse, all needs met.  Lawerance Bach Mobility Specialist Please contact via Special educational needs teacher or  Rehab office at [phone]

## 2023-08-20 DIAGNOSIS — J9601 Acute respiratory failure with hypoxia: Secondary | ICD-10-CM | POA: Diagnosis not present

## 2023-08-20 DIAGNOSIS — Z7189 Other specified counseling: Secondary | ICD-10-CM | POA: Diagnosis not present

## 2023-08-20 DIAGNOSIS — Z515 Encounter for palliative care: Secondary | ICD-10-CM | POA: Diagnosis not present

## 2023-08-20 MED ORDER — POLYETHYLENE GLYCOL 3350 17 G PO PACK: 17.0000 g | PACK | Freq: Every day | ORAL | 2 refills | Status: AC

## 2023-08-20 MED ORDER — QUETIAPINE FUMARATE 200 MG PO TABS: 200.0000 mg | ORAL_TABLET | Freq: Every day | ORAL | 3 refills | Status: AC

## 2023-08-20 MED ORDER — BUDESON-GLYCOPYRROL-FORMOTEROL 160-9-4.8 MCG/ACT IN AERO: 1.0000 | INHALATION_SPRAY | Freq: Two times a day (BID) | RESPIRATORY_TRACT | 2 refills | Status: AC

## 2023-08-20 MED ORDER — HYDROCODONE-ACETAMINOPHEN 5-325 MG PO TABS: 1.0000 | ORAL_TABLET | ORAL | 0 refills | Status: AC | PRN

## 2023-08-20 MED ORDER — SENNOSIDES-DOCUSATE SODIUM 8.6-50 MG PO TABS: 2.0000 | ORAL_TABLET | Freq: Two times a day (BID) | ORAL | 3 refills | Status: AC

## 2023-08-20 MED ORDER — ACETAMINOPHEN 325 MG PO TABS: 650.0000 mg | ORAL_TABLET | Freq: Four times a day (QID) | ORAL | Status: AC | PRN

## 2023-08-20 MED ORDER — FOLIC ACID 1 MG PO TABS: 1.0000 mg | ORAL_TABLET | Freq: Every day | ORAL | 3 refills | Status: AC

## 2023-08-20 MED ORDER — ALBUTEROL SULFATE HFA 108 (90 BASE) MCG/ACT IN AERS: 2.0000 | INHALATION_SPRAY | RESPIRATORY_TRACT | 2 refills | Status: AC | PRN

## 2023-08-20 NOTE — TOC Transition Note (Signed)
 Transition of Care [facility]) - Discharge Note   Patient Details  Name: Elizabeth Cherry MRN: [id number] Date of Birth: 03-21-1956  Transition of Care [facility]) CM/SW Contact:  Karn Cassis, LCSW Phone Number: 08/20/2023, 9:47 AM   Clinical Narrative:  Pt d/c today. Per Morrie Sheldon, DME will be delivered later this morning and then she will come pick up pt. Keka with [facility] aware and they will admit pt this afternoon as long as she is home prior to 5:00.      Final next level of care: Home w Hospice Care Barriers to Discharge: Barriers Resolved   Patient Goals and CMS Choice Patient states their goals for this hospitalization and ongoing recovery are:: get stronger CMS Medicare.gov Compare Post Acute Care list provided to:: Patient Choice offered to / list presented to : Patient, [facility] POA / Guardian      Discharge Placement                    Patient and family notified of of transfer: 08/20/23  Discharge Plan and Services Additional resources added to the After Visit Summary for   In-house Referral: Clinical Social Work Discharge Planning Services: CM Consult              DME Agency: [employer]                  Social Drivers of Health (SDOH) Interventions SDOH Screenings   Depression (PHQ2-9): Low Risk  (02/08/2020)  Social Connections: Unknown (10/01/2021)   Received from [facility], [facility]  Tobacco Use: High Risk (08/13/2023)     Readmission Risk Interventions    08/18/2023    7:50 AM  Readmission Risk Prevention Plan  Transportation Screening Complete  HRI or Home Care Consult Complete  Social Work Consult for Recovery Care Planning/Counseling Complete  Palliative Care Screening Not Applicable  Medication Review Oceanographer) Complete

## 2023-08-20 NOTE — Progress Notes (Signed)
Patient discharged with instructions given on medications and follow up visits,verbalized understanding. Prescriptions sent to Pharmacy of choice documented on AVS. IV discontinued, catheter intact. Accompanied by staff to an awaiting vehicle. 

## 2023-08-20 NOTE — Discharge Instructions (Signed)
 1)You need oxygen at home at 2 to 3 L via nasal cannula continuously while awake and while asleep--- smoking or having open fires around oxygen can cause fire, significant injury and death  2)Hospice Services at home requested

## 2023-08-20 NOTE — Discharge Summary (Signed)
 Elizabeth Cherry, is a 68 y.o. female  DOB 03/12/1956  MRN [id number].  Admission date:  08/07/2023  Admitting Physician  Deanna Artis, DO  Discharge Date:  08/20/2023   Primary MD  Rebekah Chesterfield, NP  Recommendations for primary care physician for things to follow:  1)You need oxygen at home at 2 to 3 L via nasal cannula continuously while awake and while asleep--- smoking or having open fires around oxygen can cause fire, significant injury and death  2)Hospice Services at home requested   Admission Diagnosis  Acute hypoxic respiratory failure (HCC) [J96.01]   Discharge Diagnosis  Acute hypoxic respiratory failure (HCC) [J96.01]    Principal Problem:   Acute hypoxic respiratory failure (HCC)      Past Medical History:  Diagnosis Date   Alcoholism (HCC)    IN REMISSION ALMOST 20 YEARS   Allergic rhinitis    Anorectal hemorrhage    Anxiety    Asthma    Benign neoplasm of colon 10/24/2015   Borderline personality disorder (HCC)    Breast cancer of upper-inner quadrant of right female breast (HCC) 01/08/2016   Carotid stenosis, bilateral    Chronic gastritis without bleeding    OTHER, WITHOUT HEMORRHAGE   Chronic obstructive pulmonary disease (HCC) 11/15/2011   Chronic pain 10/24/2011   Overview:  Managed by [facility]. Gets Fentanyl Patches - sees them every other month.    COPD (chronic obstructive pulmonary disease) (HCC)    Depression    Dyslipidemia    Edema    due to psychiatric meds per patient, on a diuretic   Elevated cholesterol    Fibromyalgia    Generalized anxiety disorder    GERD (gastroesophageal reflux disease)    Hypertension    no meds now   Irritable bowel syndrome    UNSPECIFIED TYPE   Malignant neoplasm of unspecified site of unspecified female breast (HCC)    Mixed hyperlipidemia    Multiple gastric ulcers    Personal history of  colonic polyps    Psoriasis 02/11/2012   Sciatica    Self neglect 10/23/2015   Severe recurrent major depression without psychotic features (HCC) 10/23/2015   Tobacco dependence     Past Surgical History:  Procedure Laterality Date   BREAST LUMPECTOMY WITH RADIOACTIVE SEED AND SENTINEL LYMPH NODE BIOPSY Right 02/14/2016   Procedure: BREAST LUMPECTOMY WITH RADIOACTIVE SEED AND SENTINEL LYMPH NODE BIOPSY;  Surgeon: Glenna Fellows, MD;  Location: [facility];  Service: General;  Laterality: Right;   DIAGNOSTIC LAPAROSCOPY     DILATION AND CURETTAGE OF UTERUS     GYNECOLOGIC CRYOSURGERY       HPI  from the history and physical done on the day of admission:   HPI: Elizabeth Cherry is a 68 y.o. female with medical history significant of COPD, asthma, right breast cancer, fibromyalgia, GERD, tobacco dependence, presenting with severe respiratory distress and worsening shortness of breath.  Per reports from EMS, patient has had worsening shortness of breath and  wheezing for a week.  Showed up to doctor's office with noted O2 sats in the 60s.  Subsequently placed on 6 L nasal cannula with improvement in O2.  Patient had admitted that she had an illness initially with nasal congestion and sore throat that progressed to worsening shortness of breath.   Upon evaluation emergency department, patient is somewhat obtunded but minimally rousable.  Patient minimally able to answer questions.  Denies pain but admits to coughing and shortness of breath.  Otherwise review of systems unable to be obtained.  Currently on BiPAP.  ABG noting 7.2 9/95/32.  WBCs 19.6.   Review of Systems: unable to review all systems due to the inability of the patient to answer questions.   Hospital Course:     Brief Narrative: Elizabeth Cherry is a 68 y.o. female with a past medical history of COPD, asthma, right breast cancer, fibromyalgia, GERD, tobacco dependence admitted on 08/07/2023 with acute metabolic  encephalopathy likely secondary to hypercapnia, acute on chronic hypoxic and hypercapnic respiratory failure, and AECOPD. She was treated with BiPAP with subsequent improvement, transition to nasal cannula, and ultimately transferred to the floor. After declining nocturnal BiPAP, she suffered worsening obtundation and respiratory acidosis prompting return to ICU on 3/26. Palliative care discussions are ongoing.   Assessment and Plan: 1)Acute metabolic encephalopathy: Likely secondary to hypercapnia.  Appears resolved,  -. She is preoccupied with returning home and feels she'd rather return home with hospice care than attempt rehabilitation at SNF. Her HCPOA as well as her neighbor who is de Oncologist are aware and understand.  - discharge home with hospice following .    2)Acute hypoxic and  hypercapnic respiratory failure: On presentation ABG 7.29/95/32.  Initially placed on BiPAP, weaned to nasal cannula at 2-3 LPM, no O2 requirement PTA. On 3/26 had lethargy dur to recurrent hypercarbic respiratory failure treated successfully with BiPAP. - Treated with  acetazolamide to stimulate respiratory drive.  -Discharge home with home O2 at 2 to 3 L per nasal cannula on hospice care   3)Acute COPD exacerbation- - Completed steroid burst x3 days and empiric levaquin x5 days (3/20 - 3/24). Restarted steroid and antibiotic with opacity on CTA chest (no PE) and completed 5 days of prednisone and augmentin (3/27 - 3/31)  - Continue dulera, singulair and prn duoneb.  -Discharge home with oxygen and hospice care   4)Tobacco use: Heavy use -Patient is reminded to avoid smoking with oxygen   Hypochromic anemia: No bleeding currently. Stable hgb on recheck.  - Start multivitamin with iron. Strongly suspect nutrition deficiency playing a role.    Generalized anxiety disorder: - Continue seroquel, sertraline, clonazepam    Physical debilitation and muscle weakness, failure to thrive, goals of care  counseling: Progressive impairment in functional status over the course of months per caretaker. Pt has also reported that she would not want intubation or resuscitation.  - Continue DNR/DNI status, if improvement continues, then plan to DC home with hospice.   - If she deteriorates further, would need to touch base again with HCPOA and likely convert to comfort measures per discussion with medical proxy.  - Appreciate palliative care discussions with pt/decision makers   GERD: -Continue Protonix   Concern for sepsis due to pneumonia: Resolved. Dyspnea, tachypnea, leukocytosis, possible pneumonia, on presentation give concern for sepsis.  Blood cultures NGTD.    Discharge Condition: Stable with hypoxia  Follow UP--hospice team   Consults obtained -palliative care and hospice  Diet and Activity recommendation:  As advised  Discharge Instructions    Discharge Instructions     Call MD for:  difficulty breathing, headache or visual disturbances   Complete by: As directed    Call MD for:  persistant dizziness or light-headedness   Complete by: As directed    Call MD for:  persistant nausea and vomiting   Complete by: As directed    Call MD for:  temperature >100.4   Complete by: As directed    Diet general   Complete by: As directed    Discharge instructions   Complete by: As directed    1)You need oxygen at home at 2 to 3 L via nasal cannula continuously while awake and while asleep--- smoking or having open fires around oxygen can cause fire, significant injury and death  2)Hospice Services at home requested   Increase activity slowly   Complete by: As directed          Discharge Medications     Allergies as of 08/20/2023       Reactions   Bactrim [sulfamethoxazole-trimethoprim] Hives   Diazepam Other (See Comments)   Severe depression   Fluoxetine Anaphylaxis, Swelling   Pt reports she is allergic to filler in prozac, her throat swell.    Telmisartan Hives       Tetracycline Hives, Rash   Tetracyclines & Related Hives, Rash   Aspirin Other (See Comments)   Gastric ulcers   Cefdinir Other (See Comments)   EXTREME EXHAUSTION   Clarithromycin Other (See Comments)   unknown   Nsaids Other (See Comments)   Gastric ulcers   Risperidone Nausea And Vomiting   Trazodone Other (See Comments)   Dry eyes   Atorvastatin Other (See Comments)   20mg  caused myalgias   Oxycodone Nausea Only   Praluent [alirocumab] Other (See Comments)   Joint pain   Pravastatin Other (See Comments)   40mg  caused myalgias   Repatha [evolocumab] Other (See Comments)   Myalgias and flu like symptoms   Rosuvastatin Other (See Comments)   myalgias        Medication List     STOP taking these medications    fluconazole 100 MG tablet Commonly known as: DIFLUCAN   hydrochlorothiazide 50 MG tablet Commonly known as: HYDRODIURIL   traMADol 50 MG tablet Commonly known as: ULTRAM       TAKE these medications    acetaminophen 325 MG tablet Commonly known as: TYLENOL Take 2 tablets (650 mg total) by mouth every 6 (six) hours as needed for mild pain (pain score 1-3), headache or fever (or Fever >/= 101). What changed:  medication strength how much to take reasons to take this   albuterol 108 (90 Base) MCG/ACT inhaler Commonly known as: VENTOLIN HFA Inhale 2 puffs into the lungs every 4 (four) hours as needed for wheezing or shortness of breath. What changed:  how much to take when to take this   budeson-glycopyrrolate-formoterol 160-9-4.8 MCG/ACT Aero Inhale 1 puff into the lungs in the morning and at bedtime.   clonazePAM 0.5 MG tablet Commonly known as: KLONOPIN Take 1 tablet (0.5 mg total) by mouth 3 (three) times daily as needed (anxiety or sleep). What changed: when to take this   dicyclomine 20 MG tablet Commonly known as: BENTYL Take 20 mg by mouth every 6 (six) hours as needed for spasms.   folic acid 1 MG tablet Commonly known as:  FOLVITE Take 1 tablet (1 mg total) by mouth daily. Start taking on: August 21, 2023   HYDROcodone-acetaminophen 5-325 MG tablet Commonly known as: NORCO/VICODIN Take 1-2 tablets by mouth every 4 (four) hours as needed for moderate pain (pain score 4-6).   hydrOXYzine 50 MG capsule Commonly known as: VISTARIL Take 2 capsules (100 mg total) by mouth at bedtime. What changed: how much to take   montelukast 10 MG tablet Commonly known as: SINGULAIR Take 1 tablet (10 mg total) by mouth daily.   pantoprazole 40 MG tablet Commonly known as: PROTONIX Take 1 tablet (40 mg total) by mouth daily.   polyethylene glycol 17 g packet Commonly known as: MIRALAX / GLYCOLAX Take 17 g by mouth daily. Start taking on: August 21, 2023   QUEtiapine 200 MG tablet Commonly known as: SEROQUEL Take 1 tablet (200 mg total) by mouth at bedtime. What changed: medication strength   senna-docusate 8.6-50 MG tablet Commonly known as: Senokot-S Take 2 tablets by mouth 2 (two) times daily.   sertraline 100 MG tablet Commonly known as: ZOLOFT Take 100 mg by mouth daily.        Major procedures and Radiology Reports - PLEASE review detailed and final reports for all details, in brief -   CT Angio Chest Pulmonary Embolism (PE) W or WO Contrast Result Date: 08/13/2023 CLINICAL DATA:  Respiratory failure. Shortness of breath. Pulmonary embolism suspected, high probability. EXAM: CT ANGIOGRAPHY CHEST WITH CONTRAST TECHNIQUE: Multidetector CT imaging of the chest was performed using the standard protocol during bolus administration of intravenous contrast. Multiplanar CT image reconstructions and MIPs were obtained to evaluate the vascular anatomy. RADIATION DOSE REDUCTION: This exam was performed according to the departmental dose-optimization program which includes automated exposure control, adjustment of the mA and/or kV according to patient size and/or use of iterative reconstruction technique. CONTRAST:  75mL  OMNIPAQUE IOHEXOL 350 MG/ML SOLN COMPARISON:  Chest CT 05/12/2018 FINDINGS: Cardiovascular: Pulmonary arteries are adequately opacified but there is motion artifact on this examination. In particular, there is motion artifact involving left upper lobe pulmonary artery on image 89/5. No clear evidence for pulmonary embolism. Heart size is normal. No significant pericardial effusion. Atherosclerotic calcifications in thoracic aorta without aneurysm. Mediastinum/Nodes: No significant mediastinal or hilar lymph node enlargement. No axillary lymph node enlargement. Esophagus is unremarkable. Lungs/Pleura: Motion artifact limits evaluation of the lungs. Focal consolidation or opacity in the medial right middle lobe on image 78/6. This area roughly measures 2.0 x 1.8 x 1.6 cm. Peripheral subpleural densities in the posterior left lower lobe are nonspecific but could represent areas of atelectasis. No pleural effusions. Upper Abdomen: Again noted is a low-density left adrenal nodule. The nodule measures 1.4 cm and Hounsfield units measure -17. This is compatible with a benign adrenal adenoma. No acute abnormality visualized upper abdomen. Musculoskeletal: No acute bone abnormality. Review of the MIP images confirms the above findings. IMPRESSION: 1. Motion artifact limits evaluation of the pulmonary arteries. No clear evidence for pulmonary embolism. 2. Focal consolidation or opacity in the medial right middle lobe. Findings are nonspecific but could represent an area of pneumonia. Recommend follow-up chest CT in 4-6 weeks to ensure resolution and exclude a neoplastic process. 3. Peripheral subpleural densities in the posterior left lower lobe are nonspecific but could represent areas of atelectasis. 4. Left adrenal adenoma. 5.  Aortic Atherosclerosis (ICD10-I70.0). Electronically Signed   By: Richarda Overlie M.D.   On: 08/13/2023 13:49   DG CHEST PORT 1 VIEW Result Date: 08/13/2023 CLINICAL DATA:  Shortness of breath EXAM:  PORTABLE CHEST 1 VIEW COMPARISON:  08/07/2023 FINDINGS: Chronic interstitial coarsening. There is no edema, consolidation, effusion, or pneumothorax. Normal heart size and aortic contours. Rounded calcification over the right upper quadrant was not seen on abdominal CT from 05/22/2018 and could be external artifact. Prominent glenohumeral spurring. IMPRESSION: No evidence of acute cardiopulmonary disease. Electronically Signed   By: Tiburcio Pea M.D.   On: 08/13/2023 06:09   DG Chest Port 1 View Result Date: 08/07/2023 CLINICAL DATA:  Shortness of breath. EXAM: PORTABLE CHEST 1 VIEW COMPARISON:  April 28, 2018. FINDINGS: The heart size and mediastinal contours are within normal limits. Both lungs are clear. The visualized skeletal structures are unremarkable. IMPRESSION: No active disease. Electronically Signed   By: Lupita Raider M.D.   On: 08/07/2023 13:09   Today   Subjective    Ahmani Garry today has no new complaints -Requesting discharge home with hospice on home O2          Patient has been seen and examined prior to discharge   Objective   Blood pressure 121/72, pulse (!) 109, temperature 97.7 F (36.5 C), temperature source Oral, resp. rate 16, height 5\' 6"  (1.676 m), weight 57.6 kg, SpO2 94%.   Intake/Output Summary (Last 24 hours) at 08/20/2023 1334 Last data filed at 08/20/2023 0500 Gross per 24 hour  Intake 480 ml  Output --  Net 480 ml    Exam Gen:- Awake Alert, no acute distress , no conversational dyspnea HEENT:- [place].AT, No sclera icterus Nose-  2L/min Neck-Supple Neck,No JVD,.  Lungs- fair air movement, no wheezing   Abd-  +ve B.Sounds, Abd Soft, No tenderness,    Extremity/Skin:- No  edema,   good pulses Psych-affect is appropriate, oriented x3 Neuro-generalized weakness, no new focal deficits, no tremors    Data Review   CBC w Diff:  Lab Results  Component Value Date   WBC 12.1 (H) 08/19/2023   HGB 12.9 08/19/2023   HGB 13.9 01/10/2016   HCT  45.2 08/19/2023   HCT 42.2 01/10/2016   PLT 242 08/19/2023   PLT 293 01/10/2016   LYMPHOPCT 4 08/07/2023   LYMPHOPCT 17.3 01/10/2016   BANDSPCT 1 08/07/2023   MONOPCT 3 08/07/2023   MONOPCT 11.1 01/10/2016   EOSPCT 0 08/07/2023   EOSPCT 3.7 01/10/2016   BASOPCT 0 08/07/2023   BASOPCT 0.4 01/10/2016    CMP:  Lab Results  Component Value Date   NA 137 08/19/2023   NA 133 (L) 12/08/2018   NA 143 01/10/2016   K 3.3 (L) 08/19/2023   K 3.8 01/10/2016   CL 101 08/19/2023   CO2 30 08/19/2023   CO2 29 01/10/2016   BUN 15 08/19/2023   BUN 9 12/08/2018   BUN 9.8 01/10/2016   CREATININE 0.56 08/19/2023   CREATININE 0.88 02/24/2020   CREATININE 0.8 01/10/2016   PROT 5.2 (L) 08/16/2023   PROT 6.7 03/29/2019   PROT 7.1 01/10/2016   ALBUMIN 2.7 (L) 08/16/2023   ALBUMIN 4.4 03/29/2019   ALBUMIN 3.5 01/10/2016   BILITOT 0.4 08/16/2023   BILITOT 0.4 03/29/2019   BILITOT <0.30 01/10/2016   ALKPHOS 47 08/16/2023   ALKPHOS 98 01/10/2016   AST 10 (L) 08/16/2023   AST 17 01/10/2016   ALT 11 08/16/2023   ALT 13 01/10/2016  .  Total Discharge time is about 33 minutes  Shon Hale M.D on 08/20/2023 at 1:34 PM  Go to [email or web address] -  for contact info  [facility] - Office  [phone]

## 2023-08-20 NOTE — Care Management Important Message (Signed)
 Important Message  Patient Details  Name: Elizabeth [facility] MRN: [id number] Date of Birth: June 30, 1955   Important Message Given:  No (discharging home with hospice services)     Corey Harold 08/20/2023, 10:03 AM

## 2023-08-20 NOTE — Progress Notes (Signed)
 Palliative: Ms. Elizabeth, Cherry, is lying quietly in bed.  She appears chronically ill and somewhat frail.  She greets me, making and mostly keeping eye contact.  She is alert and oriented, able to make her needs known.  There is no family at bedside at this time.  We talked about the plan for discharge today.  Barletta shares that her neighbor/friend, Elizabeth Cherry, will come pick her up to take her home.  She remains agreeable to outpatient hospice care for "treat the treatable" care.  Conference with attending, bedside nursing staff, transition of care team related to patient condition, needs, goals of care, disposition.  Plan:   Continue to treat the treatable but no CPR or intubation.  Home with the benefits of "treat the treatable" hospice care with [facility].  DNR/goldenrod form on chart.  35 minutes  Lillia Carmel, NP Palliative medicine team Team phone [phone]
